# Patient Record
Sex: Male | Born: 1950 | Race: White | Hispanic: No | Marital: Married | Smoking: Former smoker
Health system: Southern US, Community
[De-identification: ages and names within clinical notes are randomized; demographics above are authoritative.]

## PROBLEM LIST (undated history)

## (undated) DIAGNOSIS — Z8719 Personal history of other diseases of the digestive system: Secondary | ICD-10-CM

## (undated) DIAGNOSIS — I639 Cerebral infarction, unspecified: Secondary | ICD-10-CM

## (undated) DIAGNOSIS — E7800 Pure hypercholesterolemia, unspecified: Secondary | ICD-10-CM

## (undated) DIAGNOSIS — I10 Essential (primary) hypertension: Secondary | ICD-10-CM

## (undated) DIAGNOSIS — F32A Depression, unspecified: Secondary | ICD-10-CM

## (undated) DIAGNOSIS — C4490 Unspecified malignant neoplasm of skin, unspecified: Secondary | ICD-10-CM

## (undated) DIAGNOSIS — A09 Infectious gastroenteritis and colitis, unspecified: Secondary | ICD-10-CM

## (undated) DIAGNOSIS — F1020 Alcohol dependence, uncomplicated: Secondary | ICD-10-CM

## (undated) DIAGNOSIS — Z860100 Personal history of colon polyps, unspecified: Secondary | ICD-10-CM

## (undated) DIAGNOSIS — M1990 Unspecified osteoarthritis, unspecified site: Secondary | ICD-10-CM

## (undated) DIAGNOSIS — K635 Polyp of colon: Secondary | ICD-10-CM

## (undated) DIAGNOSIS — K5792 Diverticulitis of intestine, part unspecified, without perforation or abscess without bleeding: Secondary | ICD-10-CM

## (undated) DIAGNOSIS — F1010 Alcohol abuse, uncomplicated: Secondary | ICD-10-CM

## (undated) HISTORY — PX: TONSILLECTOMY: SUR1361

## (undated) HISTORY — DX: Polyp of colon: K63.5

## (undated) HISTORY — DX: Essential (primary) hypertension: I10

## (undated) HISTORY — DX: Alcohol abuse, uncomplicated: F10.10

## (undated) HISTORY — PX: HERNIA REPAIR: SHX51

## (undated) HISTORY — DX: Infectious gastroenteritis and colitis, unspecified: A09

## (undated) HISTORY — DX: Alcohol dependence, uncomplicated: F10.20

## (undated) HISTORY — DX: Personal history of colon polyps, unspecified: Z86.0100

## (undated) HISTORY — DX: Depression, unspecified: F32.A

## (undated) HISTORY — DX: Diverticulitis of intestine, part unspecified, without perforation or abscess without bleeding: K57.92

## (undated) HISTORY — DX: Unspecified malignant neoplasm of skin, unspecified: C44.90

## (undated) HISTORY — PX: COLONOSCOPY: SHX174

## (undated) HISTORY — PX: OTHER SURGICAL HISTORY: SHX169

---

## 2001-03-13 ENCOUNTER — Encounter: Admission: RE | Admit: 2001-03-13 | Discharge: 2001-03-13 | Payer: Self-pay | Admitting: Internal Medicine

## 2001-03-13 ENCOUNTER — Encounter: Payer: Self-pay | Admitting: Internal Medicine

## 2001-05-03 ENCOUNTER — Ambulatory Visit (HOSPITAL_COMMUNITY): Admission: RE | Admit: 2001-05-03 | Discharge: 2001-05-03 | Payer: Self-pay | Admitting: *Deleted

## 2018-11-01 ENCOUNTER — Ambulatory Visit (INDEPENDENT_AMBULATORY_CARE_PROVIDER_SITE_OTHER): Payer: Medicare Other | Admitting: Osteopathic Medicine

## 2018-11-01 ENCOUNTER — Encounter: Payer: Self-pay | Admitting: Osteopathic Medicine

## 2018-11-01 ENCOUNTER — Ambulatory Visit (INDEPENDENT_AMBULATORY_CARE_PROVIDER_SITE_OTHER): Payer: Medicare Other

## 2018-11-01 ENCOUNTER — Other Ambulatory Visit: Payer: Self-pay

## 2018-11-01 VITALS — BP 147/84 | HR 61 | Temp 98.0°F | Ht 74.0 in | Wt 225.5 lb

## 2018-11-01 DIAGNOSIS — Z87891 Personal history of nicotine dependence: Secondary | ICD-10-CM

## 2018-11-01 DIAGNOSIS — Z8719 Personal history of other diseases of the digestive system: Secondary | ICD-10-CM

## 2018-11-01 DIAGNOSIS — Z125 Encounter for screening for malignant neoplasm of prostate: Secondary | ICD-10-CM

## 2018-11-01 DIAGNOSIS — Z0000 Encounter for general adult medical examination without abnormal findings: Secondary | ICD-10-CM

## 2018-11-01 DIAGNOSIS — R200 Anesthesia of skin: Secondary | ICD-10-CM | POA: Diagnosis not present

## 2018-11-01 DIAGNOSIS — F1021 Alcohol dependence, in remission: Secondary | ICD-10-CM

## 2018-11-01 DIAGNOSIS — R202 Paresthesia of skin: Secondary | ICD-10-CM

## 2018-11-01 DIAGNOSIS — Z789 Other specified health status: Secondary | ICD-10-CM | POA: Diagnosis not present

## 2018-11-01 DIAGNOSIS — I10 Essential (primary) hypertension: Secondary | ICD-10-CM | POA: Diagnosis not present

## 2018-11-01 DIAGNOSIS — M542 Cervicalgia: Secondary | ICD-10-CM

## 2018-11-01 DIAGNOSIS — Z86010 Personal history of colonic polyps: Secondary | ICD-10-CM

## 2018-11-01 DIAGNOSIS — R351 Nocturia: Secondary | ICD-10-CM

## 2018-11-01 MED ORDER — LOSARTAN POTASSIUM-HCTZ 50-12.5 MG PO TABS: 1.0000 | ORAL_TABLET | Freq: Every day | ORAL | 1 refills | Status: DC

## 2018-11-01 NOTE — Patient Instructions (Signed)
Plan:  Changed up BP medications, see below!  Labs (including blood and urine) first thing tomorrow Xray neck today, will plan for MRI neck See me back in a couple weeks!

## 2018-11-01 NOTE — Progress Notes (Signed)
HPI: William Wheeler is a 68 y.o. male who  has a past medical history of Alcohol abuse, Alcohol addiction ([place]), Colon polyps, Diverticulitis, and Hypertension.  he presents to [facility] today, 11/01/18,  for chief complaint of: New to establish  HTN  Arm pain History of rash Nocturia/insomnia   Very pleasant new patient here to establish care.  He is self-employed as a Cabin crew, he is married to 1 of my other patients, they also on a couple of dry-cleaning businesses together.  Patient states that is been a couple years is any kind of routine physical.  He is a recovering alcoholic, celebrated 1 year sober recently.  No major medical history other than high blood pressure and colon polyp/diverticulitis.  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  Concerned about in itching, vesicular rash on the left forearm which was present several months ago but seems to have resolved, though left a numbness/burning type feeling in the arm.  This seems to radiate up the arm and get worse with neck movement.  Has tried anti-inflammatory medications, stretching, chiropractics.  History of hypertension, currently on losartan 50 mg once daily.  History of very heavy smoker, 2 to 3 packs/day for almost 30 years, quit in 2001.  Concerned about nocturia, possibly just insomnia feeling like he just needs to urinate because he is up, does not really get up with the urge to pee.  He says he is waking up about 4-5 times per night on average.  No trouble getting to sleep but does have trouble staying asleep.    Past medical, surgical, social and family history reviewed:  Patient Active Problem List   Diagnosis Date Noted  . Essential hypertension 11/01/2018  . Alcoholism in remission ([place]) 11/01/2018  . 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 Former  very heavy cigarette smoker (more than 40 per day) 11/01/2018  . History of colon polyps 11/01/2018  . History of colonic diverticulitis 11/01/2018  . Paresthesia of left arm 11/01/2018  . Nocturia 11/01/2018  . Arm numbness left 11/01/2018    Past Surgical History:  Procedure Laterality Date  . broken bone repair     Nose    Social History   Tobacco Use  . Smoking status: Former Smoker    Packs/day: 2.50    Years: 29.00    Pack years: 72.50    Quit date: 2001    Years since quitting: 19.6  . Smokeless tobacco: Never Used  Substance Use Topics  . Alcohol use: Not Currently    History reviewed. No pertinent family history.   Current medication list and allergy/intolerance information reviewed:    Current Outpatient Medications  Medication Sig Dispense Refill  . losartan-hydrochlorothiazide (HYZAAR) 50-12.5 MG tablet Take 1 tablet by mouth daily. 30 tablet 1   No current facility-administered medications for this visit.     No Known Allergies    Review of Systems:  Constitutional:  No  fever, no chills, No recent illness, No unintentional weight changes. No significant fatigue.   HEENT: No  headache, no vision change, no hearing change, No sore throat, No  sinus pressure  Cardiac: No  chest pain, No  pressure  Respiratory:  No  shortness of breath. No  Cough  Gastrointestinal: No  abdominal pain, No  nausea, No  vomiting,  No  blood in  stool, No  diarrhea, No  constipation   Musculoskeletal: No new myalgia/arthralgia  Skin: No  Rash, No other wounds/concerning lesions  Genitourinary: No  incontinence, No  abnormal genital bleeding, No abnormal genital discharge  Hem/Onc: No  easy bruising/bleeding  Endocrine: No cold intolerance,  No heat intolerance. No polyuria/polydipsia/polyphagia   Neurologic: No  weakness, No  dizziness  Psychiatric: No  concerns with depression, No  concerns with anxiety, +sleep problems, No mood problems  Exam:  BP (!) 147/84  (BP Location: Left Arm, Patient Position: Sitting, Cuff Size: Normal)   Pulse 61   Temp 98 F (36.7 C) (Oral)   Ht 6\' 2"  (1.88 m)   Wt 225 lb 8 oz (102.3 kg)   BMI 28.95 kg/m   Constitutional: VS see above. General Appearance: alert, well-developed, well-nourished, NAD  Eyes: Normal lids and conjunctive, non-icteric sclera  Ears, Nose, Mouth, Throat: . TM normal bilaterally. Pharynx/tonsils no erythema, no exudate.  Neck: No masses, trachea midline. No thyroid enlargement. No tenderness/mass appreciated. No lymphadenopathy  Respiratory: Normal respiratory effort. no wheeze, no rhonchi, no rales  Cardiovascular: S1/S2 normal, no murmur, no rub/gallop auscultated. RRR. No lower extremity edema.   Gastrointestinal: Nontender, no masses. No hepatomegaly, no splenomegaly. No hernia appreciated. Bowel sounds normal. Rectal exam deferred.   Musculoskeletal: Gait normal. No clubbing/cyanosis of digits.  Negative Spurling's test to the L  Neurological: Normal balance/coordination. No tremor. No cranial nerve deficit on limited exam. Motor and sensation intact and symmetric. Cerebellar reflexes intact.   Skin: warm, dry, intact. No rash/ulcer. No concerning nevi or subq nodules on limited exam.    Psychiatric: Normal judgment/insight. Normal mood and affect. Oriented x3.    No results found for this or any previous visit (from the past 72 hour(s)).  No results found.   ASSESSMENT/PLAN: The primary encounter diagnosis was Essential hypertension. Diagnoses of Alcoholism in remission [facility]), 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, Former very heavy cigarette smoker (more than 40 per day), Prostate cancer screening, History of colon polyps, History of colonic diverticulitis, Paresthesia of left arm, Arm numbness left, Nocturia, Annual physical exam, and Cervicalgia were also pertinent to this visit.   Rash/paresthesia sounds like possibly residual neuropathic pain from shingles outbreak.  Possibly  musculoskeletal though, patient has failed greater than 6 weeks of conservative treatment and would like to proceed with MRI if possible.  Orders Placed This Encounter  Procedures  . DG Cervical Spine Complete  . MR Cervical Spine Wo Contrast  . CBC  . COMPLETE METABOLIC PANEL WITH GFR  . Lipid panel  . PSA, Total with Reflex to PSA, Free  . TSH  . Urinalysis, Routine w reflex microscopic    Meds ordered this encounter  Medications  . losartan-hydrochlorothiazide (HYZAAR) 50-12.5 MG tablet    Sig: Take 1 tablet by mouth daily.    Dispense:  30 tablet    Refill:  1    Patient Instructions  Plan:  Changed up BP medications, see below!  Labs (including blood and urine) first thing tomorrow Xray neck today, will plan for MRI neck See me back in a couple weeks!          Visit summary with medication list and pertinent instructions was printed for patient to review. All questions at time of visit were answered - patient instructed to contact office with any additional concerns or updates. ER/RTC precautions were reviewed with the patient.     Please note: voice recognition software was used to produce  this document, and typos may escape review. Please contact Dr. Sheppard Coil for any needed clarifications.     Follow-up plan: Return in about 2 weeks (around 11/15/2018) for follow up blood pressure and review labs/imaging results w/ Dr Sheppard Coil .

## 2018-11-05 ENCOUNTER — Ambulatory Visit (INDEPENDENT_AMBULATORY_CARE_PROVIDER_SITE_OTHER): Payer: Medicare Other

## 2018-11-05 ENCOUNTER — Other Ambulatory Visit: Payer: Self-pay | Admitting: Osteopathic Medicine

## 2018-11-05 ENCOUNTER — Other Ambulatory Visit: Payer: Self-pay

## 2018-11-05 DIAGNOSIS — Z1389 Encounter for screening for other disorder: Secondary | ICD-10-CM | POA: Diagnosis not present

## 2018-11-05 DIAGNOSIS — M542 Cervicalgia: Secondary | ICD-10-CM

## 2018-11-09 LAB — URINALYSIS, ROUTINE W REFLEX MICROSCOPIC
Bilirubin Urine: NEGATIVE
Glucose, UA: NEGATIVE
Hgb urine dipstick: NEGATIVE
Ketones, ur: NEGATIVE
Leukocytes,Ua: NEGATIVE
Nitrite: NEGATIVE
Protein, ur: NEGATIVE
Specific Gravity, Urine: 1.018 (ref 1.001–1.03)
pH: <=5 (ref 5.0–8.0)

## 2018-11-09 LAB — CBC
HCT: 47.1 % (ref 38.5–50.0)
Hemoglobin: 15.6 g/dL (ref 13.2–17.1)
MCH: 28.7 pg (ref 27.0–33.0)
MCHC: 33.1 g/dL (ref 32.0–36.0)
MCV: 86.6 fL (ref 80.0–100.0)
MPV: 11.3 fL (ref 7.5–12.5)
Platelets: 232 10*3/uL (ref 140–400)
RBC: 5.44 10*6/uL (ref 4.20–5.80)
RDW: 14.2 % (ref 11.0–15.0)
WBC: 6.5 10*3/uL (ref 3.8–10.8)

## 2018-11-09 LAB — COMPLETE METABOLIC PANEL WITH GFR
AG Ratio: 1.8 (calc) (ref 1.0–2.5)
ALT: 11 U/L (ref 9–46)
AST: 12 U/L (ref 10–35)
Albumin: 4.2 g/dL (ref 3.6–5.1)
Alkaline phosphatase (APISO): 35 U/L (ref 35–144)
BUN: 18 mg/dL (ref 7–25)
CO2: 25 mmol/L (ref 20–32)
Calcium: 9.7 mg/dL (ref 8.6–10.3)
Chloride: 107 mmol/L (ref 98–110)
Creat: 1.11 mg/dL (ref 0.70–1.25)
GFR, Est African American: 80 mL/min/{1.73_m2} (ref >=60)
GFR, Est Non African American: 69 mL/min/{1.73_m2} (ref >=60)
Globulin: 2.4 g/dL (calc) (ref 1.9–3.7)
Glucose, Bld: 95 mg/dL (ref 65–99)
Potassium: 4.7 mmol/L (ref 3.5–5.3)
Sodium: 140 mmol/L (ref 135–146)
Total Bilirubin: 0.6 mg/dL (ref 0.2–1.2)
Total Protein: 6.6 g/dL (ref 6.1–8.1)

## 2018-11-09 LAB — LIPID PANEL
Cholesterol: 206 mg/dL — ABNORMAL HIGH (ref <200)
HDL: 45 mg/dL (ref >=40)
LDL Cholesterol (Calc): 139 mg/dL (calc) — ABNORMAL HIGH
Non-HDL Cholesterol (Calc): 161 mg/dL (calc) — ABNORMAL HIGH (ref <130)
Total CHOL/HDL Ratio: 4.6 (calc) (ref <5.0)
Triglycerides: 107 mg/dL (ref <150)

## 2018-11-09 LAB — PSA, TOTAL WITH REFLEX TO PSA, FREE: PSA, Total: 0.8 ng/mL (ref <=4.0)

## 2018-11-09 LAB — TSH: TSH: 1.49 mIU/L (ref 0.40–4.50)

## 2018-11-15 ENCOUNTER — Ambulatory Visit (INDEPENDENT_AMBULATORY_CARE_PROVIDER_SITE_OTHER): Payer: Medicare Other | Admitting: Osteopathic Medicine

## 2018-11-15 ENCOUNTER — Encounter: Payer: Self-pay | Admitting: Osteopathic Medicine

## 2018-11-15 ENCOUNTER — Other Ambulatory Visit: Payer: Self-pay

## 2018-11-15 VITALS — BP 155/95 | HR 56 | Temp 97.8°F | Wt 226.7 lb

## 2018-11-15 DIAGNOSIS — B029 Zoster without complications: Secondary | ICD-10-CM

## 2018-11-15 DIAGNOSIS — Z23 Encounter for immunization: Secondary | ICD-10-CM | POA: Diagnosis not present

## 2018-11-15 DIAGNOSIS — M4302 Spondylolysis, cervical region: Secondary | ICD-10-CM | POA: Diagnosis not present

## 2018-11-15 DIAGNOSIS — M4802 Spinal stenosis, cervical region: Secondary | ICD-10-CM | POA: Diagnosis not present

## 2018-11-15 MED ORDER — VALACYCLOVIR HCL 1 G PO TABS: 1000.0000 mg | ORAL_TABLET | Freq: Three times a day (TID) | ORAL | 0 refills | Status: DC

## 2018-11-15 MED ORDER — GABAPENTIN 300 MG PO CAPS: 300.0000 mg | ORAL_CAPSULE | Freq: Three times a day (TID) | ORAL | 0 refills | Status: DC | PRN

## 2018-11-15 NOTE — Progress Notes (Signed)
HPI: William Wheeler is a 68 y.o. male who  has a past medical history of Alcohol abuse, Alcohol addiction ([place]), Colon polyps, Diverticulitis, and Hypertension.  he presents to [facility] today, 11/15/18,  for chief complaint of:  Follow-up arm/neck pain Concern for shingles  History of what sounds like shingles on left upper arm with some residual nerve pain.  See previous note 11/01/2018 for details.  Today, he complains of similar burning skin sensation on the left thigh/hip and left lower back, no rash, extremely sensitive to the touch as far as clothes rubbing on it will bother him.  Feels pretty similar to the arm pain that he had before.  We went over x-ray/MRI results.  Fairly significant arthritic changes in C-spine, see MRI from 11/05/2018.  Patient at this point does not wish to pursue specialist referral or formal physical therapy    At today's visit 11/15/18 ... PMH, PSH, FH reviewed and updated as needed.  Current medication list and allergy/intolerance hx reviewed and updated as needed. (See remainder of HPI, ROS, Phys Exam below)   No results found.  No results found for this or any previous visit (from the past 72 hour(s)).    BP Readings from Last 3 Encounters:  11/15/18 (!) 155/95  11/01/18 (!) 147/84       ASSESSMENT/PLAN: The primary encounter diagnosis was Need for influenza vaccination. Diagnoses of Cervical spondylolysis, Cervical stenosis of spinal canal, Foraminal stenosis of cervical region, and Herpes zoster without complication were also pertinent to this visit.   Orders Placed This Encounter  Procedures  . Flu Vaccine QUAD High Dose(Fluad)     Meds ordered this encounter  Medications  . valACYclovir (VALTREX) 1000 MG tablet    Sig: Take 1 tablet (1,000 mg total) by mouth 3 (three) times daily.    Dispense:  21 tablet    Refill:  0  . gabapentin (NEURONTIN) 300 MG capsule    Sig: Take 1-2 capsules  (300-600 mg total) by mouth 3 (three) times daily as needed.    Dispense:  180 capsule    Refill:  0       Follow-up plan: Return in about 2 weeks (around 11/29/2018) for nurse visit BP check.                                                 ################################################# ################################################# ################################################# #################################################    Current Meds  Medication Sig  . losartan-hydrochlorothiazide (HYZAAR) 50-12.5 MG tablet Take 1 tablet by mouth daily.    No Known Allergies     Review of Systems:  Constitutional: No recent illness  Cardiac: No  chest pain, No  pressure, No palpitations  Respiratory:  No  shortness of breath.   Gastrointestinal: No  abdominal pain  Musculoskeletal: No new myalgia/arthralgia  Skin: No  Rash, +skin pain per HPI  Neurologic: No  weakness, No  Dizziness  Exam:  BP (!) 155/95 (BP Location: Left Arm, Patient Position: Sitting, Cuff Size: Normal)   Pulse (!) 56   Temp 97.8 F (36.6 C) (Oral)   Wt 226 lb 11.2 oz (102.8 kg)   BMI 29.11 kg/m   Constitutional: VS see above. General Appearance: alert, well-developed, well-nourished, NAD  Eyes: Normal lids and conjunctive, non-icteric sclera  Neck: No masses, trachea midline.   Respiratory: Normal respiratory effort.  Musculoskeletal: Gait normal. Symmetric and independent movement of all extremities  Neurological: Normal balance/coordination. No tremor.  Skin: warm, dry, intact.   Psychiatric: Normal judgment/insight. Normal mood and affect. Oriented x3.       Visit summary with medication list and pertinent instructions was printed for patient to review, patient was advised to alert [place] if any updates are needed. All questions at time of visit were answered - patient instructed to contact office with any additional concerns.  ER/RTC precautions were reviewed with the patient and understanding verbalized.   Note: Total time spent 25 minutes, greater than 50% of the visit was spent face-to-face counseling and coordinating care for the following: The primary encounter diagnosis was Need for influenza vaccination. Diagnoses of Cervical spondylolysis, Cervical stenosis of spinal canal, Foraminal stenosis of cervical region, and Herpes zoster without complication were also pertinent to this visit.Marland Kitchen  Please note: voice recognition software was used to produce this document, and typos may escape review. Please contact Dr. Sheppard Coil for any needed clarifications.    Follow up plan: Return in about 2 weeks (around 11/29/2018) for nurse visit BP check.

## 2018-11-16 DIAGNOSIS — M4302 Spondylolysis, cervical region: Secondary | ICD-10-CM | POA: Insufficient documentation

## 2018-11-16 DIAGNOSIS — B029 Zoster without complications: Secondary | ICD-10-CM | POA: Insufficient documentation

## 2018-11-16 DIAGNOSIS — M4802 Spinal stenosis, cervical region: Secondary | ICD-10-CM | POA: Insufficient documentation

## 2018-11-20 ENCOUNTER — Telehealth: Payer: Self-pay

## 2018-11-20 MED ORDER — HYDROCODONE-ACETAMINOPHEN 5-325 MG PO TABS: 1.0000 | ORAL_TABLET | Freq: Three times a day (TID) | ORAL | 0 refills | Status: DC | PRN

## 2018-11-20 NOTE — Telephone Encounter (Signed)
OK sent short course to pharmacy, please let him know this I sent an opiate pain medication and refills will not be made indefinitely, he should set up virtual visit to discus pai management if needed!

## 2018-11-20 NOTE — Telephone Encounter (Signed)
Pt has been updated and is aware of provider's note. Pt will set up a virtual appt prn. No other inquiries during call.

## 2018-11-20 NOTE — Telephone Encounter (Signed)
Pt left a vm msg stating he now has a rash and excrutiating pain on the left side of his torso/ lower extremities. As per pt, he was given valacyclovir rx for Shingles outbreak at his last appt on 11/15/18 . Pt is now requesting a pain med because he is unable to move around and do normal activities. Pls advise, thanks.

## 2018-11-22 ENCOUNTER — Telehealth: Payer: Self-pay

## 2018-11-22 ENCOUNTER — Other Ambulatory Visit: Payer: Self-pay | Admitting: Osteopathic Medicine

## 2018-11-22 NOTE — Telephone Encounter (Signed)
Pt called - still experiencing intolerable pain from Shingles outbreak. Pt states current pain med not working. He has been missing work because of the pain. Pt is requesting something "better" to be sent to the pharmacy. Pls advise, thanks.

## 2018-11-22 NOTE — Telephone Encounter (Signed)
At this point, I've sent 5 days of pain medications, cannot legally send more without visit per my understanding up [place] STOP Act which regulated opiate prescription. Would reocmmend virtual visit with another provider to discuss and determine appropriateness of higher does / longer duration

## 2018-11-22 NOTE — Telephone Encounter (Signed)
Pt called requesting a med refill for valacyclovir. Pt states that gabapentin and hydrocodone is not relieving pain from Shingles outbreak. As per Dr. Georgina Snell, no refill at this time. Pt will need to come in for re-evaluation of symptoms. Contacted pt, agrees with covering provider's recommendation. He was also made aware during time of call of Dr. Redgie Grayer update of changing current pain medication. Pt was transferred to front desk for scheduling an appt.

## 2018-11-23 ENCOUNTER — Ambulatory Visit (INDEPENDENT_AMBULATORY_CARE_PROVIDER_SITE_OTHER): Payer: Medicare Other | Admitting: Sports Medicine

## 2018-11-23 ENCOUNTER — Encounter: Payer: Self-pay | Admitting: Sports Medicine

## 2018-11-23 ENCOUNTER — Other Ambulatory Visit: Payer: Self-pay

## 2018-11-23 DIAGNOSIS — B029 Zoster without complications: Secondary | ICD-10-CM

## 2018-11-23 MED ORDER — PREDNISONE 50 MG PO TABS: ORAL_TABLET | ORAL | 0 refills | Status: DC

## 2018-11-23 MED ORDER — HYDROCODONE-ACETAMINOPHEN 10-325 MG PO TABS: 1.0000 | ORAL_TABLET | Freq: Three times a day (TID) | ORAL | 0 refills | Status: DC | PRN

## 2018-11-23 MED ORDER — GABAPENTIN 800 MG PO TABS: 800.0000 mg | ORAL_TABLET | Freq: Three times a day (TID) | ORAL | 3 refills | Status: DC

## 2018-11-23 NOTE — Assessment & Plan Note (Signed)
Shingles in the left L4 and L5 distribution. He finished his course of Valtrex 1000 3 times daily for 7 days. Persistent and severe allodynia on the skin of his abdomen. Adding 5 days of prednisone, increasing gabapentin to 800 mg 3 times daily. Increasing hydrocodone. Virtual visit with me in a week.

## 2018-11-23 NOTE — Progress Notes (Signed)
Subjective:    CC: Shingles  HPI: William Wheeler returns, he was diagnosed with left lumbar shingles at his last visit, started appropriately on gabapentin and high-dose Valtrex.  Unfortunately he continues to have significant allodynia over his abdomen, legs, shoulders, no other symptoms.  His rash is still present but starting to darken, there are no open sores or scabs.  I reviewed the past medical history, family history, social history, surgical history, and allergies today and no changes were needed.  Please see the problem list section below in epic for further details.  Past Medical History: Past Medical History:  Diagnosis Date  . Alcohol abuse   . 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 Colon polyps   . Diverticulitis   . Hypertension    Past Surgical History: Past Surgical History:  Procedure Laterality Date  . broken bone repair     Nose   Social History: Social History   Socioeconomic History  . Marital status: Married    Spouse name: Not on file  . Number of children: Not on file  . Years of education: Not on file  . Highest education level: Not on file  Occupational History  . Not on file  Social Needs  . Financial resource strain: Not on file  . Food insecurity    Worry: Not on file    Inability: Not on file  . Transportation needs    Medical: Not on file    Non-medical: Not on file  Tobacco Use  . Smoking status: Former Smoker    Packs/day: 2.50    Years: 29.00    Pack years: 72.50    Quit date: 2001    Years since quitting: 19.7  . Smokeless tobacco: Never Used  Substance and Sexual Activity  . Alcohol use: Not Currently  . Drug use: Not Currently    Types: Marijuana  . Sexual activity: Not Currently    Partners: Female  Lifestyle  . Physical activity    Days per week: Not on file    Minutes per session: Not on file  . Stress: Not on file  Relationships  . Social Herbalist on phone: Not on file    Gets together: Not on file    Attends  religious service: Not on file    Active member of club or organization: Not on file    Attends meetings of clubs or organizations: Not on file    Relationship status: Not on file  Other Topics Concern  . Not on file  Social History Narrative  . Not on file   Family History: No family history on file. Allergies: No Known Allergies Medications: See med rec.  Review of Systems: No fevers, chills, night sweats, weight loss, chest pain, or shortness of breath.   Objective:    General: Well Developed, well nourished, and in no acute distress.  Neuro: Alert and oriented x3, extra-ocular muscles intact, sensation grossly intact.  HEENT: Normocephalic, atraumatic, pupils equal round reactive to light, neck supple, no masses, no lymphadenopathy, thyroid nonpalpable.  Skin: Warm and dry, left L3 versus L4 distribution herpes zoster.  No open sores, no scabs, starting to fade. Cardiac: Regular rate and rhythm, no murmurs rubs or gallops, no lower extremity edema.  Respiratory: Clear to auscultation bilaterally. Not using accessory muscles, speaking in full sentences.  Impression and Recommendations:    Herpes zoster without complication Shingles in the left L4 and L5 distribution. He finished his course of Valtrex 1000 3 times  daily for 7 days. Persistent and severe allodynia on the skin of his abdomen. Adding 5 days of prednisone, increasing gabapentin to 800 mg 3 times daily. Increasing hydrocodone. Virtual visit with me in a week.   ___________________________________________ Gwen Her. Dianah Field, M.D., ABFM., CAQSM. Primary Care and Sports Medicine [facility]  Adjunct Professor of [place] of [facility] [employer]

## 2018-11-23 NOTE — Telephone Encounter (Signed)
Pt was updated yesterday regarding provider's note. As per covering provider, pt will need to be re-evaluated. Pt has agreed with recommendation and was transferred to front desk for scheduling. No other inquiries during call.

## 2018-11-29 ENCOUNTER — Ambulatory Visit (INDEPENDENT_AMBULATORY_CARE_PROVIDER_SITE_OTHER): Payer: Medicare Other | Admitting: Sports Medicine

## 2018-11-29 ENCOUNTER — Other Ambulatory Visit: Payer: Self-pay

## 2018-11-29 ENCOUNTER — Ambulatory Visit: Payer: Medicare Other

## 2018-11-29 DIAGNOSIS — B029 Zoster without complications: Secondary | ICD-10-CM | POA: Diagnosis not present

## 2018-11-29 MED ORDER — DOCUSATE SODIUM 100 MG PO CAPS: 100.0000 mg | ORAL_CAPSULE | Freq: Three times a day (TID) | ORAL | 3 refills | Status: DC | PRN

## 2018-11-29 MED ORDER — OXYCODONE-ACETAMINOPHEN 10-325 MG PO TABS: 1.0000 | ORAL_TABLET | Freq: Four times a day (QID) | ORAL | 0 refills | Status: DC | PRN

## 2018-11-29 NOTE — Assessment & Plan Note (Addendum)
Left L4 and L5 distribution shingles. He finished Valtrex thousand 3 times daily. Continues to have severe allodynia of the skin of his abdomen. He feels better putting pressure on this so we added a rib belt. I am going to increase gabapentin again to 800 mg in the morning, 800 midday and 1600 in the evening. Increasing pain medication from hydrocodone to oxycodone tens. Adding some Colace. We do need some labs, return to see [place] in a couple of weeks. We will unlikely be doing any more narcotics.

## 2018-11-29 NOTE — Progress Notes (Signed)
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  We started gabapentin 800 mg 3 times daily, also hydrocodone 10 mg.  He returns today with persistence of discomfort in his belly.  He is also getting a little bit constipated.  I reviewed the past medical history, family history, social history, surgical history, and allergies today and no changes were needed.  Please see the problem list section below in epic for further details.  Past Medical History: Past Medical History:  Diagnosis Date  . Alcohol abuse   . Alcohol addiction ([place])   . Colon polyps   . Diverticulitis   . Hypertension    Past Surgical History: Past Surgical History:  Procedure Laterality Date  . broken bone repair     Nose   Social History: Social History   Socioeconomic History  . Marital status: Married    Spouse name: Not on file  . Number of children: Not on file  . Years of education: Not on file  . Highest education level: Not on file  Occupational History  . Not on file  Social Needs  . Financial resource strain: Not on file  . Food insecurity    Worry: Not on file    Inability: Not on file  . Transportation needs    Medical: Not on file    Non-medical: Not on file  Tobacco Use  . Smoking status: Former Smoker    Packs/day: 2.50    Years: 29.00    Pack years: 72.50    Quit date: 2001    Years since quitting: 19.7  . Smokeless tobacco: Never Used  Substance and Sexual Activity  . Alcohol use: Not Currently  . Drug use: Not Currently    Types: Marijuana  . Sexual activity: Not Currently    Partners: Female  Lifestyle  . Physical activity    Days per week: Not on file    Minutes per session: Not on file  . Stress: Not on file  Relationships  . Social Herbalist on phone: Not on file    Gets together: Not on file    Attends religious  service: Not on file    Active member of club or organization: Not on file    Attends meetings of clubs or organizations: Not on file    Relationship status: Not on file  Other Topics Concern  . Not on file  Social History Narrative  . Not on file   Family History: No family history on file. Allergies: No Known Allergies Medications: See med rec.  Review of Systems: No fevers, chills, night sweats, weight loss, chest pain, or shortness of breath.   Objective:    General: Well Developed, well nourished, and in no acute distress.  Neuro: Alert and oriented x3, extra-ocular muscles intact, sensation grossly intact.  HEENT: Normocephalic, atraumatic, pupils equal round reactive to light, neck supple, no masses, no lymphadenopathy, thyroid nonpalpable.  Skin: Warm and dry, no rashes. Cardiac: Regular rate and rhythm, no murmurs rubs or gallops, no lower extremity edema.  Respiratory: Clear to auscultation bilaterally. Not using accessory muscles, speaking in full sentences. Abdomen: No tenderness to deep palpation, no organomegaly, he does however have significant pain to light palpation confirming allodynia.  Impression and Recommendations:    Herpes zoster without complication Left L4 and L5 distribution shingles. He finished Valtrex thousand 3  times daily. Continues to have severe allodynia of the skin of his abdomen. He feels better putting pressure on this so we added a rib belt. I am going to increase gabapentin again to 800 mg in the morning, 800 midday and 1600 in the evening. Increasing pain medication from hydrocodone to oxycodone tens. Adding some Colace. We do need some labs, return to see [place] in a couple of weeks. We will unlikely be doing any more narcotics.   ___________________________________________ Gwen Her. Dianah Field, M.D., ABFM., CAQSM. Primary Care and Sports Medicine [facility]  Adjunct Professor of [place]  of [facility] [employer]

## 2018-11-30 ENCOUNTER — Ambulatory Visit: Payer: Medicare Other | Admitting: Sports Medicine

## 2018-12-06 ENCOUNTER — Telehealth: Payer: Self-pay | Admitting: *Deleted

## 2018-12-06 ENCOUNTER — Other Ambulatory Visit: Payer: Self-pay | Admitting: *Deleted

## 2018-12-06 DIAGNOSIS — B029 Zoster without complications: Secondary | ICD-10-CM

## 2018-12-06 NOTE — Telephone Encounter (Signed)
Pt left vm today stating that the shingles pain is started to subside some but he still does have some pain.  He wanted to know if you would refill the pain medication until his f/u appointment with you next Thursday 10/8.  Please advise.

## 2018-12-07 MED ORDER — OXYCODONE-ACETAMINOPHEN 10-325 MG PO TABS: 1.0000 | ORAL_TABLET | Freq: Four times a day (QID) | ORAL | 0 refills | Status: DC | PRN

## 2018-12-07 NOTE — Telephone Encounter (Signed)
Patient notified and did not have any questions.  

## 2018-12-07 NOTE — Telephone Encounter (Signed)
Meds refilled.

## 2018-12-13 ENCOUNTER — Other Ambulatory Visit: Payer: Self-pay

## 2018-12-13 ENCOUNTER — Ambulatory Visit (INDEPENDENT_AMBULATORY_CARE_PROVIDER_SITE_OTHER): Payer: Medicare Other | Admitting: Sports Medicine

## 2018-12-13 DIAGNOSIS — B029 Zoster without complications: Secondary | ICD-10-CM | POA: Diagnosis not present

## 2018-12-13 MED ORDER — OXYCODONE-ACETAMINOPHEN 10-325 MG PO TABS: 1.0000 | ORAL_TABLET | Freq: Three times a day (TID) | ORAL | 0 refills | Status: DC | PRN

## 2018-12-13 NOTE — Progress Notes (Signed)
Subjective:    CC: Follow-up  HPI: William Wheeler returns, he had a left L4 and L5 distribution shingles with skin allodynia, we treated him aggressively, finally his pain is coming under control.  Rash is resolved, no constitutional symptoms.  I reviewed the past medical history, family history, social history, surgical history, and allergies today and no changes were needed.  Please see the problem list section below in epic for further details.  Past Medical History: Past Medical History:  Diagnosis Date  . Alcohol abuse   . Alcohol addiction ([place])   . Colon polyps   . Diverticulitis   . Hypertension    Past Surgical History: Past Surgical History:  Procedure Laterality Date  . broken bone repair     Nose   Social History: Social History   Socioeconomic History  . Marital status: Married    Spouse name: Not on file  . Number of children: Not on file  . Years of education: Not on file  . Highest education level: Not on file  Occupational History  . Not on file  Social Needs  . Financial resource strain: Not on file  . Food insecurity    Worry: Not on file    Inability: Not on file  . Transportation needs    Medical: Not on file    Non-medical: Not on file  Tobacco Use  . Smoking status: Former Smoker    Packs/day: 2.50    Years: 29.00    Pack years: 72.50    Quit date: 2001    Years since quitting: 19.7  . Smokeless tobacco: Never Used  Substance and Sexual Activity  . Alcohol use: Not Currently  . Drug use: Not Currently    Types: Marijuana  . Sexual activity: Not Currently    Partners: Female  Lifestyle  . Physical activity    Days per week: Not on file    Minutes per session: Not on file  . Stress: Not on file  Relationships  . Social Herbalist on phone: Not on file    Gets together: Not on file    Attends religious service: Not on file    Active member of club or organization: Not on file    Attends meetings of clubs or organizations:  Not on file    Relationship status: Not on file  Other Topics Concern  . Not on file  Social History Narrative  . Not on file   Family History: No family history on file. Allergies: No Known Allergies Medications: See med rec.  Review of Systems: No fevers, chills, night sweats, weight loss, chest pain, or shortness of breath.   Objective:    General: Well Developed, well nourished, and in no acute distress.  Neuro: Alert and oriented x3, extra-ocular muscles intact, sensation grossly intact.  HEENT: Normocephalic, atraumatic, pupils equal round reactive to light, neck supple, no masses, no lymphadenopathy, thyroid nonpalpable.  Skin: Warm and dry, no rashes. Cardiac: Regular rate and rhythm, no murmurs rubs or gallops, no lower extremity edema.  Respiratory: Clear to auscultation bilaterally. Not using accessory muscles, speaking in full sentences.  Impression and Recommendations:    Herpes zoster without complication Left L4 and L5 distribution shingles. Finished Valtrex 1000 mg 3 times daily. He did have some severe skin allodynia, this is starting to improve considerably with gabapentin 800 mg in the morning, 800 mg midday, and 1600 mg in the evening. Single additional refill of 1 week of oxycodone, this will  be 21 pills. He and I both discussed that he would not be getting any more oxycodone, return to see me as needed.   ___________________________________________ Gwen Her. Dianah Field, M.D., ABFM., CAQSM. Primary Care and Sports Medicine [facility]  Adjunct Professor of [employer]

## 2018-12-13 NOTE — Assessment & Plan Note (Signed)
Left L4 and L5 distribution shingles. Finished Valtrex 1000 mg 3 times daily. He did have some severe skin allodynia, this is starting to improve considerably with gabapentin 800 mg in the morning, 800 mg midday, and 1600 mg in the evening. Single additional refill of 1 week of oxycodone, this will be 21 pills. He and I both discussed that he would not be getting any more oxycodone, return to see me as needed.

## 2019-01-15 ENCOUNTER — Other Ambulatory Visit: Payer: Self-pay | Admitting: Osteopathic Medicine

## 2019-02-18 ENCOUNTER — Encounter: Payer: Self-pay | Admitting: Osteopathic Medicine

## 2019-02-18 ENCOUNTER — Telehealth (INDEPENDENT_AMBULATORY_CARE_PROVIDER_SITE_OTHER): Payer: Medicare Other | Admitting: Osteopathic Medicine

## 2019-02-18 ENCOUNTER — Telehealth: Payer: Medicare Other

## 2019-02-18 VITALS — Temp 97.0°F | Wt 225.0 lb

## 2019-02-18 DIAGNOSIS — B0229 Other postherpetic nervous system involvement: Secondary | ICD-10-CM

## 2019-02-18 DIAGNOSIS — K5792 Diverticulitis of intestine, part unspecified, without perforation or abscess without bleeding: Secondary | ICD-10-CM | POA: Diagnosis not present

## 2019-02-18 MED ORDER — AMOXICILLIN-POT CLAVULANATE 875-125 MG PO TABS: 1.0000 | ORAL_TABLET | Freq: Three times a day (TID) | ORAL | 0 refills | Status: DC

## 2019-02-18 NOTE — Progress Notes (Signed)
Virtual Visit via Video (App used: Doximity) Note  I connected with      William Wheeler on 02/18/19 at 3:40 PM  by a telemedicine application and verified that I am speaking with the correct person using two identifiers.  Patient is at home I am in office   I discussed the limitations of evaluation and management by telemedicine and the availability of in person appointments. The patient expressed understanding and agreed to proceed.  History of Present Illness: William Wheeler is a 68 y.o. male who would like to discuss diverticulitis / LLQ pain  Has been fine for a long time but had a handful of peanuts yesterday and now LLQ pain and loose stool, no bloody stool, no fever, no N/V. Hx diverticulitis, this feels about the same, amoxicillin has worked in the past.    Reports persistent nerve pain d/t shingles, ok on Gabapentin but often forgets the afternoon dose.      Observations/Objective: Temp (!) 97 F (36.1 C) (Oral)   Wt 225 lb (102.1 kg)   BMI 28.89 kg/m  BP Readings from Last 3 Encounters:  12/13/18 133/76  11/29/18 (!) 156/95  11/23/18 (!) 145/85   Exam: Normal Speech.  NAD  Lab and Radiology Results No results found for this or any previous visit (from the past 72 hour(s)). No results found.     Assessment and Plan: 68 y.o. male with The primary encounter diagnosis was Diverticulitis. A diagnosis of Post herpetic neuralgia was also pertinent to this visit.  OK to send abx for low risk patient, would Rx cipro/flagyl if worse  OK to switch to Lyrica if desired pt would rather hold off for now and continue gabapentin  Inquires about shingles vaccine    PDMP not reviewed this encounter. No orders of the defined types were placed in this encounter.  Meds ordered this encounter  Medications  . amoxicillin-clavulanate (AUGMENTIN) 875-125 MG tablet    Sig: Take 1 tablet by mouth 3 (three) times daily for 7 days.    Dispense:  21 tablet    Refill:  0    Patient Instructions  Regarding shingles vaccination in patient with history of shingles: "Guidelines from the CDC do not indicate a specific length of time a patient should wait to get vaccinated after an episode of herpes zoster but do state that [vaccination] should be delayed until the acute stage of the illness is over and symptoms resolve (eg, the rash has gone away)."  So you'd be ok to get the shot! If you'd like to schedule a nurse visit for the vaccine please call our office.   Are the antibiotics kicking in yet?  Let me know if there are any issues!   Follow Up Instructions: Return if symptoms worsen or fail to improve.    I discussed the assessment and treatment plan with the patient. The patient was provided an opportunity to ask questions and all were answered. The patient agreed with the plan and demonstrated an understanding of the instructions.   The patient was advised to call back or seek an in-person evaluation if any new concerns, if symptoms worsen or if the condition fails to improve as anticipated.  25 minutes of non-face-to-face time was provided during this encounter.      . . . . . . . . . . . . . Marland Kitchen                   Historical information  moved to improve visibility of documentation.  Past Medical History:  Diagnosis Date  . Alcohol abuse   . 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 Colon polyps   . Diverticulitis   . Hypertension    Past Surgical History:  Procedure Laterality Date  . broken bone repair     Nose   Social History   Tobacco Use  . Smoking status: Former Smoker    Packs/day: 2.50    Years: 29.00    Pack years: 72.50    Quit date: 2001    Years since quitting: 19.9  . Smokeless tobacco: Never Used  Substance Use Topics  . Alcohol use: Not Currently   family history is not on file.  Medications: Current Outpatient Medications  Medication Sig Dispense Refill  . gabapentin (NEURONTIN) 800 MG tablet 1  tab in the morning, 1 tab midday, 2 tabs at bedtime    . losartan-hydrochlorothiazide (HYZAAR) 50-12.5 MG tablet TAKE 1 TABLET BY MOUTH EVERY DAY 30 tablet 1  . valACYclovir (VALTREX) 1000 MG tablet TAKE 1 TABLET BY MOUTH THREE TIMES A DAY 21 tablet 0  . docusate sodium (COLACE) 100 MG capsule Take 1 capsule (100 mg total) by mouth 3 (three) times daily as needed. (Patient not taking: Reported on 02/18/2019) 30 capsule 3  . oxyCODONE-acetaminophen (PERCOCET) 10-325 MG tablet Take 1 tablet by mouth every 8 (eight) hours as needed for pain. (Patient not taking: Reported on 02/18/2019) 21 tablet 0   No current facility-administered medications for this visit.   No Known Allergies

## 2019-02-19 ENCOUNTER — Encounter: Payer: Self-pay | Admitting: Osteopathic Medicine

## 2019-02-19 NOTE — Patient Instructions (Signed)
Regarding shingles vaccination in patient with history of shingles: "Guidelines from the CDC do not indicate a specific length of time a patient should wait to get vaccinated after an episode of herpes zoster but do state that [vaccination] should be delayed until the acute stage of the illness is over and symptoms resolve (eg, the rash has gone away)."  So you'd be ok to get the shot! If you'd like to schedule a nurse visit for the vaccine please call our office.

## 2019-03-14 ENCOUNTER — Other Ambulatory Visit: Payer: Self-pay | Admitting: Osteopathic Medicine

## 2019-04-25 ENCOUNTER — Other Ambulatory Visit: Payer: Self-pay | Admitting: Osteopathic Medicine

## 2019-04-27 ENCOUNTER — Ambulatory Visit: Payer: Medicare Other | Attending: Internal Medicine

## 2019-04-27 DIAGNOSIS — Z23 Encounter for immunization: Secondary | ICD-10-CM | POA: Insufficient documentation

## 2019-04-27 NOTE — Progress Notes (Signed)
   Covid-19 Vaccination Clinic  Name:  William Wheeler    MRN: [id number] DOB: 22-Jan-1951  04/27/2019  [name] was observed post Covid-19 immunization for 15 minutes without incidence. He was provided with Vaccine Information Sheet and instruction to access the V-Safe system.   Mr. Marks was instructed to call 911 with any severe reactions post vaccine: Marland Kitchen Difficulty breathing  . Swelling of your face and throat  . A fast heartbeat  . A bad rash all over your body  . Dizziness and weakness    Immunizations Administered    Name Date Dose VIS Date Route   Pfizer COVID-19 Vaccine 04/27/2019 11:44 AM 0.3 mL 02/15/2019 Intramuscular   Manufacturer: [place]   Lot: [id number]   [place]: [id number]

## 2019-05-16 ENCOUNTER — Other Ambulatory Visit: Payer: Self-pay

## 2019-05-16 ENCOUNTER — Ambulatory Visit (INDEPENDENT_AMBULATORY_CARE_PROVIDER_SITE_OTHER): Payer: Medicare Other | Admitting: Osteopathic Medicine

## 2019-05-16 ENCOUNTER — Encounter: Payer: Self-pay | Admitting: Osteopathic Medicine

## 2019-05-16 VITALS — BP 134/87 | HR 68 | Temp 98.1°F | Wt 226.0 lb

## 2019-05-16 DIAGNOSIS — R413 Other amnesia: Secondary | ICD-10-CM | POA: Diagnosis not present

## 2019-05-16 DIAGNOSIS — R252 Cramp and spasm: Secondary | ICD-10-CM

## 2019-05-16 DIAGNOSIS — R29818 Other symptoms and signs involving the nervous system: Secondary | ICD-10-CM | POA: Diagnosis not present

## 2019-05-16 DIAGNOSIS — H539 Unspecified visual disturbance: Secondary | ICD-10-CM | POA: Diagnosis not present

## 2019-05-16 NOTE — Progress Notes (Signed)
William Wheeler is a 69 y.o. male who presents to  [place] at [facility]  today, 05/16/19, seeking care for the following: Marland Kitchen Muscle cramps few days, blurred vision few days, memory changes past few weeks. Was on gabapentin for shingles pain, stopped this several weeks ago (maybe 2 weeks before other symptoms)  . Concern about insomnia, OTC meds not helping.      ASSESSMENT & PLAN with other pertinent history/findings:  The primary encounter diagnosis was Muscle cramp. Diagnoses of Vision changes, Memory loss, and Other symptoms and signs involving the nervous system were also pertinent to this visit.  R: 20/30 L: 20/40 Both: 20/20  Patient Instructions  Cramping, memory problems, and vision change are not terribly specific - I'm not sure I can give you a clear diagnosis at this time, but I have a few ideas, and I don't think anything serious or life-threatening is going on (no stroke, seizure, tumor, infection). There's a possibility that the Gabapentin was treating an underlying neurological overactivity of sorts, but you stopped this awhile ago an I would have expected the symptoms to coincide a bit closer together if the Gabapentin withdrawal was the reason.   At this point, let's get some labs done to check for other imbalance/organ dysfunction, let's strongly consider getting a brain MRI, possibly get a  neurologist involved +/- recheck with ophthalmology. I might also suggest trying restarting gabapentin to at least help with the cramps (assuming nothing else obvious shows up on labs)         Orders Placed This Encounter  Procedures  . MR Brain W Wo Contrast  . CBC  . COMPLETE METABOLIC PANEL WITH GFR  . TSH  . Urinalysis, Routine w reflex microscopic    No orders of the defined types were placed in this encounter.      Follow-up instructions: Return for RECHECK PENDING RESULTS / IF WORSE OR  CHANGE.                                         BP 134/87 (BP Location: Left Arm, Patient Position: Sitting, Cuff Size: Normal)   Pulse 68   Temp 98.1 F (36.7 C) (Oral)   Wt 226 lb 0.6 oz (102.5 kg)   BMI 29.02 kg/m   Current Meds  Medication Sig  . losartan-hydrochlorothiazide (HYZAAR) 50-12.5 MG tablet TAKE 1 TABLET BY MOUTH EVERY DAY    No results found for this or any previous visit (from the past 72 hour(s)).  No results found.  Depression screen [facility] 2/9 11/01/2018  Decreased Interest 0  Down, Depressed, Hopeless 1  PHQ - 2 Score 1  Altered sleeping 3  Tired, decreased energy 1  Change in appetite 0  Feeling bad or failure about yourself  3  Trouble concentrating 0  Moving slowly or fidgety/restless 0  Suicidal thoughts 0  PHQ-9 Score 8  Difficult doing work/chores Somewhat difficult    GAD 7 : Generalized Anxiety Score 11/01/2018  Nervous, Anxious, on Edge 1  Control/stop worrying 1  Worry too much - different things 0  Trouble relaxing 1  Restless 0  Easily annoyed or irritable 1  Afraid - awful might happen 0  Total GAD 7 Score 4  Anxiety Difficulty Somewhat difficult      All questions at time of visit were answered - patient instructed to contact  office with any additional concerns or updates.  ER/RTC precautions were reviewed with the patient.  Please note: voice recognition software was used to produce this document, and typos may escape review. Please contact Dr. Sheppard Coil for any needed clarifications.

## 2019-05-16 NOTE — Patient Instructions (Addendum)
Cramping, memory problems, and vision change are not terribly specific - I'm not sure I can give you a clear diagnosis at this time, but I have a few ideas, and I don't think anything serious or life-threatening is going on (no stroke, seizure, tumor, infection). There's a possibility that the Gabapentin was treating an underlying neurological overactivity of sorts, but you stopped this awhile ago an I would have expected the symptoms to coincide a bit closer together if the Gabapentin withdrawal was the reason.   At this point, let's get some labs done to check for other imbalance/organ dysfunction, let's strongly consider getting a brain MRI, possibly get a  neurologist involved +/- recheck with ophthalmology. I might also suggest trying restarting gabapentin to at least help with the cramps (assuming nothing else obvious shows up on labs)

## 2019-05-17 LAB — CBC
HCT: 43.1 % (ref 38.5–50.0)
Hemoglobin: 14.5 g/dL (ref 13.2–17.1)
MCH: 29.1 pg (ref 27.0–33.0)
MCHC: 33.6 g/dL (ref 32.0–36.0)
MCV: 86.5 fL (ref 80.0–100.0)
MPV: 11.6 fL (ref 7.5–12.5)
Platelets: 267 10*3/uL (ref 140–400)
RBC: 4.98 10*6/uL (ref 4.20–5.80)
RDW: 13.1 % (ref 11.0–15.0)
WBC: 9 10*3/uL (ref 3.8–10.8)

## 2019-05-17 LAB — URINALYSIS, ROUTINE W REFLEX MICROSCOPIC
Bilirubin Urine: NEGATIVE
Glucose, UA: NEGATIVE
Hgb urine dipstick: NEGATIVE
Ketones, ur: NEGATIVE
Leukocytes,Ua: NEGATIVE
Nitrite: NEGATIVE
Protein, ur: NEGATIVE
Specific Gravity, Urine: 1.024 (ref 1.001–1.03)
pH: <=5 (ref 5.0–8.0)

## 2019-05-17 LAB — TSH: TSH: 1.38 mIU/L (ref 0.40–4.50)

## 2019-05-17 LAB — COMPLETE METABOLIC PANEL WITH GFR
AG Ratio: 1.6 (calc) (ref 1.0–2.5)
ALT: 17 U/L (ref 9–46)
AST: 24 U/L (ref 10–35)
Albumin: 4.2 g/dL (ref 3.6–5.1)
Alkaline phosphatase (APISO): 36 U/L (ref 35–144)
BUN: 25 mg/dL (ref 7–25)
CO2: 25 mmol/L (ref 20–32)
Calcium: 9.9 mg/dL (ref 8.6–10.3)
Chloride: 106 mmol/L (ref 98–110)
Creat: 1.08 mg/dL (ref 0.70–1.25)
GFR, Est African American: 82 mL/min/{1.73_m2} (ref >=60)
GFR, Est Non African American: 71 mL/min/{1.73_m2} (ref >=60)
Globulin: 2.6 g/dL (calc) (ref 1.9–3.7)
Glucose, Bld: 92 mg/dL (ref 65–99)
Potassium: 4.4 mmol/L (ref 3.5–5.3)
Sodium: 139 mmol/L (ref 135–146)
Total Bilirubin: 0.3 mg/dL (ref 0.2–1.2)
Total Protein: 6.8 g/dL (ref 6.1–8.1)

## 2019-05-21 ENCOUNTER — Ambulatory Visit: Payer: Medicare Other | Attending: Internal Medicine

## 2019-05-21 DIAGNOSIS — Z23 Encounter for immunization: Secondary | ICD-10-CM

## 2019-05-21 NOTE — Progress Notes (Signed)
   Covid-19 Vaccination Clinic  Name:  William Wheeler    MRN: [id number] DOB: January 14, 1951  05/21/2019  [name] was observed post Covid-19 immunization for 15 minutes without incident. He was provided with Vaccine Information Sheet and instruction to access the V-Safe system.   [name] was instructed to call 911 with any severe reactions post vaccine: Marland Kitchen Difficulty breathing  . Swelling of face and throat  . A fast heartbeat  . A bad rash all over body  . Dizziness and weakness   Immunizations Administered    Name Date Dose VIS Date Route   Pfizer COVID-19 Vaccine 05/21/2019 11:36 AM 0.3 mL 02/15/2019 Intramuscular   Manufacturer: [place]   Lot: [id number]   [place]: [id number]

## 2019-06-03 ENCOUNTER — Other Ambulatory Visit: Payer: Medicare Other

## 2019-06-10 ENCOUNTER — Other Ambulatory Visit: Payer: Medicare Other

## 2019-06-17 ENCOUNTER — Other Ambulatory Visit: Payer: Medicare Other

## 2019-06-21 ENCOUNTER — Other Ambulatory Visit: Payer: Self-pay | Admitting: Osteopathic Medicine

## 2019-06-24 ENCOUNTER — Other Ambulatory Visit: Payer: Self-pay

## 2019-06-24 ENCOUNTER — Ambulatory Visit (INDEPENDENT_AMBULATORY_CARE_PROVIDER_SITE_OTHER): Payer: Medicare Other

## 2019-06-24 DIAGNOSIS — R29818 Other symptoms and signs involving the nervous system: Secondary | ICD-10-CM

## 2019-06-24 MED ORDER — GADOBUTROL 1 MMOL/ML IV SOLN
10.0000 mL | Freq: Once | INTRAVENOUS | Status: AC | PRN
  Administered 2019-06-24: 10:00:00 10 mL via INTRAVENOUS

## 2019-06-25 ENCOUNTER — Other Ambulatory Visit: Payer: Self-pay

## 2019-06-25 MED ORDER — ASPIRIN EC 81 MG PO TBEC: 81.0000 mg | DELAYED_RELEASE_TABLET | Freq: Every day | ORAL | 3 refills | Status: DC

## 2019-06-25 MED ORDER — LOSARTAN POTASSIUM-HCTZ 50-12.5 MG PO TABS: 1.0000 | ORAL_TABLET | Freq: Every day | ORAL | 1 refills | Status: DC

## 2019-06-25 MED ORDER — ATORVASTATIN CALCIUM 40 MG PO TABS: 40.0000 mg | ORAL_TABLET | Freq: Every day | ORAL | 3 refills | Status: DC

## 2019-06-25 MED ORDER — CLOPIDOGREL BISULFATE 75 MG PO TABS: 75.0000 mg | ORAL_TABLET | Freq: Every day | ORAL | 0 refills | Status: DC

## 2019-07-02 ENCOUNTER — Encounter: Payer: Self-pay | Admitting: Osteopathic Medicine

## 2019-07-02 ENCOUNTER — Ambulatory Visit (INDEPENDENT_AMBULATORY_CARE_PROVIDER_SITE_OTHER): Payer: Medicare Other | Admitting: Osteopathic Medicine

## 2019-07-02 VITALS — BP 148/79 | HR 63 | Ht 74.02 in | Wt 226.3 lb

## 2019-07-02 DIAGNOSIS — I639 Cerebral infarction, unspecified: Secondary | ICD-10-CM

## 2019-07-02 NOTE — Progress Notes (Signed)
William Wheeler is a 69 y.o. male who presents to  [facility] at [facility]  today, 07/02/19, seeking care for the following: . 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  Reviewed images w/ patient Discussed prevention Pt reports still having cramps (I think unrelated to CVA) no residual weakness or confusion  BP not at goal - pt forgot meds this AM! Advised home BP monitor   The ASCVD Risk score [name], et al., 2013) failed to calculate for the following reasons:   The patient has a prior MI or stroke diagnosis  BP Readings from Last 3 Encounters:  07/02/19 (!) 148/79  05/16/19 134/87  12/13/18 133/76     Patient Instructions  Stroke:  Workup:   MRI brain - done!  Echocardiogram and ultrasound of carotid arteries (look for clot/plaque that might have broken loose and caused a stroke) - ordered these tests  Management & prevention:   Antiplatelet therapy: aspirin PLUS Plavix for 21 days, then one or the other indefinitely   Statin cholesterol medication to 1) prevent future stroke and 2) to get LDL (bad cholesterol) down to ideally 70 or less  Blood pressure control: goal 130/80 or less      Orders Placed This Encounter  Procedures  . US Carotid Bilateral  . Lipid panel  . COMPLETE METABOLIC PANEL WITH GFR  . ECHOCARDIOGRAM COMPLETE    No orders of the defined types were placed in this encounter.      Follow-up instructions: Return in about 6 weeks (around 08/13/2019) for IN-OFFICE VISIT BP, labs prior to visit .                                         BP (!) 148/79 (BP Location: Left Arm, Patient Position: Sitting, Cuff Size: Normal)   Pulse 63   Ht 6' 2.02" (1.88 m)   Wt 226 lb 4.8 oz (102.6 kg)   SpO2 97%   BMI 29.04 kg/m   Current  Meds  Medication Sig  . aspirin EC 81 MG tablet Take 1 tablet (81 mg total) by mouth daily.  Marland Kitchen atorvastatin (LIPITOR) 40 MG tablet Take 1 tablet (40 mg total) by mouth daily.  . clopidogrel (PLAVIX) 75 MG tablet Take 1 tablet (75 mg total) by mouth daily.  Marland Kitchen losartan-hydrochlorothiazide (HYZAAR) 50-12.5 MG tablet Take 1 tablet by mouth daily.    No results found for this or any previous visit (from the past 72 hour(s)).  No results found.  Depression screen [facility] 2/9 07/02/2019 11/01/2018  Decreased Interest 0 0  Down, Depressed, Hopeless 0 1  PHQ - 2 Score 0 1  Altered sleeping 1 3  Tired, decreased energy 0 1  Change in appetite 0 0  Feeling bad or failure about yourself  0 3  Trouble concentrating 1 0  Moving slowly or fidgety/restless 0 0  Suicidal thoughts 0 0  PHQ-9 Score 2 8  Difficult doing work/chores Not difficult at all Somewhat difficult    GAD 7 : Generalized Anxiety Score 07/02/2019 11/01/2018  Nervous, Anxious, on Edge 1 1  Control/stop worrying 1 1  Worry too much - different things 0 0  Trouble relaxing 1 1  Restless 0 0  Easily annoyed or irritable  0 1  Afraid - awful might happen 1 0  Total GAD 7 Score 4 4  Anxiety Difficulty Not difficult at all Somewhat difficult      All questions at time of visit were answered - patient instructed to contact office with any additional concerns or updates.  ER/RTC precautions were reviewed with the patient.  Please note: voice recognition software was used to produce this document, and typos may escape review. Please contact Dr. Sheppard Coil for any needed clarifications.   Total encounter time: 30 minutes.

## 2019-07-02 NOTE — Patient Instructions (Signed)
Stroke:  Workup:   MRI brain - done!  Echocardiogram and ultrasound of carotid arteries (look for clot/plaque that might have broken loose and caused a stroke) - ordered these tests  Management & prevention:   Antiplatelet therapy: aspirin PLUS Plavix for 21 days, then one or the other indefinitely   Statin cholesterol medication to 1) prevent future stroke and 2) to get LDL (bad cholesterol) down to ideally 70 or less  Blood pressure control: goal 130/80 or less

## 2019-07-09 ENCOUNTER — Ambulatory Visit: Payer: Medicare Other

## 2019-07-09 ENCOUNTER — Other Ambulatory Visit: Payer: Self-pay

## 2019-07-15 ENCOUNTER — Other Ambulatory Visit (HOSPITAL_BASED_OUTPATIENT_CLINIC_OR_DEPARTMENT_OTHER): Payer: Medicare Other

## 2019-07-16 ENCOUNTER — Other Ambulatory Visit: Payer: Self-pay

## 2019-07-16 ENCOUNTER — Ambulatory Visit (HOSPITAL_BASED_OUTPATIENT_CLINIC_OR_DEPARTMENT_OTHER)
Admission: RE | Admit: 2019-07-16 | Discharge: 2019-07-16 | Disposition: A | Discharge status: Home / Self Care | Payer: Medicare Other | Source: Ambulatory Visit | Attending: Osteopathic Medicine | Admitting: Osteopathic Medicine

## 2019-07-16 DIAGNOSIS — I6389 Other cerebral infarction: Secondary | ICD-10-CM

## 2019-07-16 DIAGNOSIS — I639 Cerebral infarction, unspecified: Secondary | ICD-10-CM | POA: Diagnosis not present

## 2019-07-16 DIAGNOSIS — I351 Nonrheumatic aortic (valve) insufficiency: Secondary | ICD-10-CM | POA: Diagnosis not present

## 2019-07-16 DIAGNOSIS — I10 Essential (primary) hypertension: Secondary | ICD-10-CM | POA: Insufficient documentation

## 2019-07-16 NOTE — Progress Notes (Signed)
  Echocardiogram 2D Echocardiogram has been performed.  William Wheeler 07/16/2019, 9:57 AM

## 2019-07-28 ENCOUNTER — Other Ambulatory Visit: Payer: Self-pay | Admitting: Osteopathic Medicine

## 2019-08-13 ENCOUNTER — Ambulatory Visit (INDEPENDENT_AMBULATORY_CARE_PROVIDER_SITE_OTHER): Payer: Medicare Other | Admitting: Osteopathic Medicine

## 2019-08-13 ENCOUNTER — Encounter: Payer: Self-pay | Admitting: Osteopathic Medicine

## 2019-08-13 ENCOUNTER — Other Ambulatory Visit: Payer: Self-pay

## 2019-08-13 VITALS — BP 137/86 | HR 54 | Temp 97.5°F | Wt 228.1 lb

## 2019-08-13 DIAGNOSIS — I639 Cerebral infarction, unspecified: Secondary | ICD-10-CM | POA: Diagnosis not present

## 2019-08-13 DIAGNOSIS — H539 Unspecified visual disturbance: Secondary | ICD-10-CM | POA: Diagnosis not present

## 2019-08-13 DIAGNOSIS — B0229 Other postherpetic nervous system involvement: Secondary | ICD-10-CM

## 2019-08-13 DIAGNOSIS — Z8673 Personal history of transient ischemic attack (TIA), and cerebral infarction without residual deficits: Secondary | ICD-10-CM | POA: Diagnosis not present

## 2019-08-13 MED ORDER — CLOPIDOGREL BISULFATE 75 MG PO TABS: 75.0000 mg | ORAL_TABLET | Freq: Every day | ORAL | 3 refills | Status: DC

## 2019-08-13 NOTE — Progress Notes (Signed)
William Wheeler is a 69 y.o. male who presents to  [place] at [facility]  today, 08/13/19, seeking care for the following: . Follow-up BP, results. CVA w/ no residual deficits   BP Readings from Last 3 Encounters:  08/13/19 137/86  07/02/19 (!) 148/79  05/16/19 134/87      ASSESSMENT & PLAN with other pertinent history/findings:  The primary encounter diagnosis was History of CVA (cerebrovascular accident) without residual deficits. Diagnoses of Vision changes and Post herpetic neuralgia were also pertinent to this visit.    Patient Instructions  Workup:   MRI brain - done!  Echocardiogram and ultrasound of carotid arteries (look for clot/plaque that might have broken loose and caused a stroke) - Echo normal, no heart abnormality. Carotid US showed some cholesterol plaque which is not unusual, but nothing that's compromising blood flow to require surgery.   Management & prevention:   Antiplatelet therapy: Plavix indefinitely to prevent stroke   Statin cholesterol medication to 1) prevent future stroke and 2) to get LDL (bad cholesterol) down to ideally 70 or less - need labs!   Blood pressure control: goal 130/80 or less  - better!   Refer ophtho  Avoid caffeine - urinary issue   Try Voltatren / diclofenac OTC gel for skin pain       Orders Placed This Encounter  Procedures  . Ambulatory referral to Ophthalmology    Meds ordered this encounter  Medications  . clopidogrel (PLAVIX) 75 MG tablet    Sig: Take 1 tablet (75 mg total) by mouth daily.    Dispense:  90 tablet    Refill:  3       Follow-up instructions: Return in about 3 months (around 11/13/2019) for follow up BP and weight, etc. .                                         BP 137/86 (BP Location: Left Arm, Patient Position: Sitting, Cuff Size: Normal)   Pulse (!) 54   Temp (!) 97.5 F (36.4 C) (Oral)   Wt 228 lb  1.9 oz (103.5 kg)   BMI 29.28 kg/m   Current Meds  Medication Sig  . aspirin EC 81 MG tablet Take 1 tablet (81 mg total) by mouth daily.  Marland Kitchen atorvastatin (LIPITOR) 40 MG tablet Take 1 tablet (40 mg total) by mouth daily.  . clopidogrel (PLAVIX) 75 MG tablet Take 1 tablet (75 mg total) by mouth daily.  Marland Kitchen losartan-hydrochlorothiazide (HYZAAR) 50-12.5 MG tablet Take 1 tablet by mouth daily.  . [DISCONTINUED] clopidogrel (PLAVIX) 75 MG tablet TAKE 1 TABLET BY MOUTH EVERY DAY    No results found for this or any previous visit (from the past 72 hour(s)).  No results found.  Depression screen [facility] 2/9 07/02/2019 11/01/2018  Decreased Interest 0 0  Down, Depressed, Hopeless 0 1  PHQ - 2 Score 0 1  Altered sleeping 1 3  Tired, decreased energy 0 1  Change in appetite 0 0  Feeling bad or failure about yourself  0 3  Trouble concentrating 1 0  Moving slowly or fidgety/restless 0 0  Suicidal thoughts 0 0  PHQ-9 Score 2 8  Difficult doing work/chores Not difficult at all Somewhat difficult    GAD 7 : Generalized Anxiety Score 07/02/2019 11/01/2018  Nervous, Anxious, on Edge 1 1  Control/stop worrying 1  1  Worry too much - different things 0 0  Trouble relaxing 1 1  Restless 0 0  Easily annoyed or irritable 0 1  Afraid - awful might happen 1 0  Total GAD 7 Score 4 4  Anxiety Difficulty Not difficult at all Somewhat difficult      All questions at time of visit were answered - patient instructed to contact office with any additional concerns or updates.  ER/RTC precautions were reviewed with the patient.  Please note: voice recognition software was used to produce this document, and typos may escape review. Please contact Dr. Sheppard Coil for any needed clarifications.

## 2019-08-13 NOTE — Patient Instructions (Addendum)
Workup:   MRI brain - done!  Echocardiogram and ultrasound of carotid arteries (look for clot/plaque that might have broken loose and caused a stroke) - Echo normal, no heart abnormality. Carotid US showed some cholesterol plaque which is not unusual, but nothing that's compromising blood flow to require surgery.   Management & prevention:   Antiplatelet therapy: Plavix indefinitely to prevent stroke   Statin cholesterol medication to 1) prevent future stroke and 2) to get LDL (bad cholesterol) down to ideally 70 or less - need labs!   Blood pressure control: goal 130/80 or less  - better!   Refer ophtho  Avoid caffeine - urinary issue   Try Voltatren / diclofenac OTC gel for skin pain

## 2019-09-25 ENCOUNTER — Encounter: Payer: Self-pay | Admitting: Osteopathic Medicine

## 2019-10-08 LAB — LIPID PANEL
Cholesterol: 129 mg/dL (ref <200)
HDL: 47 mg/dL (ref >=40)
LDL Cholesterol (Calc): 68 mg/dL (calc)
Non-HDL Cholesterol (Calc): 82 mg/dL (calc) (ref <130)
Total CHOL/HDL Ratio: 2.7 (calc) (ref <5.0)
Triglycerides: 61 mg/dL (ref <150)

## 2019-10-08 LAB — COMPLETE METABOLIC PANEL WITH GFR
AG Ratio: 1.6 (calc) (ref 1.0–2.5)
ALT: 16 U/L (ref 9–46)
AST: 17 U/L (ref 10–35)
Albumin: 4.2 g/dL (ref 3.6–5.1)
Alkaline phosphatase (APISO): 40 U/L (ref 35–144)
BUN: 19 mg/dL (ref 7–25)
CO2: 26 mmol/L (ref 20–32)
Calcium: 9.9 mg/dL (ref 8.6–10.3)
Chloride: 107 mmol/L (ref 98–110)
Creat: 1.15 mg/dL (ref 0.70–1.25)
GFR, Est African American: 76 mL/min/{1.73_m2} (ref >=60)
GFR, Est Non African American: 66 mL/min/{1.73_m2} (ref >=60)
Globulin: 2.7 g/dL (calc) (ref 1.9–3.7)
Glucose, Bld: 100 mg/dL — ABNORMAL HIGH (ref 65–99)
Potassium: 4.5 mmol/L (ref 3.5–5.3)
Sodium: 141 mmol/L (ref 135–146)
Total Bilirubin: 0.5 mg/dL (ref 0.2–1.2)
Total Protein: 6.9 g/dL (ref 6.1–8.1)

## 2019-10-10 ENCOUNTER — Other Ambulatory Visit: Payer: Self-pay

## 2019-10-10 ENCOUNTER — Ambulatory Visit (INDEPENDENT_AMBULATORY_CARE_PROVIDER_SITE_OTHER): Payer: Medicare Other | Admitting: Osteopathic Medicine

## 2019-10-10 VITALS — BP 121/66 | HR 56 | Wt 221.0 lb

## 2019-10-10 DIAGNOSIS — K4090 Unilateral inguinal hernia, without obstruction or gangrene, not specified as recurrent: Secondary | ICD-10-CM

## 2019-10-10 DIAGNOSIS — R252 Cramp and spasm: Secondary | ICD-10-CM

## 2019-10-10 DIAGNOSIS — I639 Cerebral infarction, unspecified: Secondary | ICD-10-CM | POA: Diagnosis not present

## 2019-10-10 NOTE — Progress Notes (Signed)
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 male who presents to  [place] at [facility]  today, 10/10/19, seeking care for the following:  . Concern for inguinal hernia - started 2 weeks ago, uncomfortable but no pain, feels like a mass/lump. Palpable inguinal hernia on R      ASSESSMENT & PLAN with other pertinent findings:  The primary encounter diagnosis was Non-recurrent unilateral inguinal hernia without obstruction or gangrene. A diagnosis of Muscle cramping was also pertinent to this visit.    Patient Instructions   Hernia - will refer to general surgery to discuss options   Leg cramps - pickle juice is fine! If severe symptoms such that we might consider daily/nightly medications, let me know.   Hand cramping - may try wrist brace when driving?    Orders Placed This Encounter  Procedures  . Ambulatory referral to General Surgery       Follow-up instructions: Return if symptoms worsen or fail to improve.                                         BP 121/66   Pulse (!) 56   Wt 221 lb (100.2 kg)   BMI 28.36 kg/m   No outpatient medications have been marked as taking for the 10/10/19 encounter (Office Visit) with Emeterio Reeve, DO.    No results found.     All questions at time of visit were answered - patient instructed to contact office with any additional concerns or updates.  ER/RTC precautions were reviewed with the patient as applicable.   Please note: voice recognition software was used to produce this document, and typos may escape review. Please contact Dr. Sheppard Coil for any needed clarifications.

## 2019-10-10 NOTE — Patient Instructions (Signed)
   Hernia - will refer to general surgery to discuss options   Leg cramps - pickle juice is fine! If severe symptoms such that we might consider daily/nightly medications, let me know.   Hand cramping - may try wrist brace when driving?

## 2019-11-12 ENCOUNTER — Ambulatory Visit: Payer: Medicare Other | Admitting: Osteopathic Medicine

## 2019-12-05 ENCOUNTER — Encounter: Payer: Self-pay | Admitting: Family Medicine

## 2019-12-05 ENCOUNTER — Ambulatory Visit (INDEPENDENT_AMBULATORY_CARE_PROVIDER_SITE_OTHER): Payer: Medicare Other | Admitting: Family Medicine

## 2019-12-05 DIAGNOSIS — K4090 Unilateral inguinal hernia, without obstruction or gangrene, not specified as recurrent: Secondary | ICD-10-CM | POA: Diagnosis not present

## 2019-12-05 NOTE — Assessment & Plan Note (Signed)
Transient pain yesterday with coughing which has resolved today.  Hernia is non-tender and reducible.  Having some constipation, suggested increased fiber and addition of miralax as needed.  Red flags reviewed including worsening pain that stays, tenderness or firmness over hernia, vomiting, fever or chills.  Instructed to seek emergency care if these occur.  Keep appt for surgery in December.

## 2019-12-05 NOTE — Progress Notes (Signed)
William Wheeler - 69 y.o. male MRN [id number]  Date of birth: 09/07/1950  Subjective Chief Complaint  Patient presents with  . Constipation  . hernia complications    HPI William Wheeler is a 69 y.o. male here today to discuss hernia.  He was diagnosed with hernia at office visit in August with Dr. Sheppard Coil.  He has appt to have this repaired in December.  His concern today is that he felt sharp, stabbing pain with coughing yesterday.  This has resolved today.  He has not had nausea, fever, tenderness over hernia.  He has had issues with bowel movements and constipation since he first noticed the hernia.  He did try docusate without much improvement.  He is still having bowel movement.  ROS:  A comprehensive ROS was completed and negative except as noted per HPI  No Known Allergies  Past Medical History:  Diagnosis Date  . Alcohol abuse   . 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 Colon polyps   . Diverticulitis   . Hypertension     Past Surgical History:  Procedure Laterality Date  . broken bone repair     Nose    Social History   Socioeconomic History  . Marital status: Married    Spouse name: Not on file  . Number of children: Not on file  . Years of education: Not on file  . Highest education level: Not on file  Occupational History  . Not on file  Tobacco Use  . Smoking status: Former Smoker    Packs/day: 2.50    Years: 29.00    Pack years: 72.50    Quit date: 2001    Years since quitting: 20.7  . Smokeless tobacco: Never Used  Vaping Use  . Vaping Use: Never used  Substance and Sexual Activity  . Alcohol use: Not Currently  . Drug use: Not Currently    Types: Marijuana  . Sexual activity: Not Currently    Partners: Female  Other Topics Concern  . Not on file  Social History Narrative  . Not on file   Social Determinants of Health   Financial Resource Strain:   . Difficulty of Paying Living Expenses: Not on file  Food Insecurity:   . Worried About Ship broker in the Last Year: Not on file  . Ran Out of Food in the Last Year: Not on file  Transportation Needs:   . Lack of Transportation (Medical): Not on file  . Lack of Transportation (Non-Medical): Not on file  Physical Activity:   . Days of Exercise per Week: Not on file  . Minutes of Exercise per Session: Not on file  Stress:   . Feeling of Stress : Not on file  Social Connections:   . Frequency of Communication with Friends and Family: Not on file  . Frequency of Social Gatherings with Friends and Family: Not on file  . Attends Religious Services: Not on file  . Active Member of Clubs or Organizations: Not on file  . Attends Archivist Meetings: Not on file  . Marital Status: Not on file    History reviewed. No pertinent family history.  Health Maintenance  Topic Date Due  . Hepatitis C Screening  Never done  . TETANUS/TDAP  Never done  . COLONOSCOPY  Never done  . PNA vac Low Risk Adult (1 of 2 - PCV13) Never done  . INFLUENZA VACCINE  10/06/2019  . COVID-19 Vaccine  Completed     -----------------------------------------------------------------------------------------------------------------------------------------------------------------------------------------------------------------  Physical Exam BP 119/68 (BP Location: Left Arm, Patient Position: Sitting, Cuff Size: Normal)   Pulse (!) 57   Wt 210 lb 4.8 oz (95.4 kg)   SpO2 96%   BMI 26.99 kg/m   Physical Exam Constitutional:      Appearance: Normal appearance.  HENT:     Head: Normocephalic and atraumatic.  Eyes:     General: No scleral icterus. Abdominal:     Comments: R inguinal hernia.  Non-tender, reducible.   Neurological:     General: No focal deficit present.     Mental Status: He is alert.  Psychiatric:        Mood and Affect: Mood normal.        Behavior: Behavior normal.      ------------------------------------------------------------------------------------------------------------------------------------------------------------------------------------------------------------------- Assessment and Plan  Inguinal hernia Transient pain yesterday with coughing which has resolved today.  Hernia is non-tender and reducible.  Having some constipation, suggested increased fiber and addition of miralax as needed.  Red flags reviewed including worsening pain that stays, tenderness or firmness over hernia, vomiting, fever or chills.  Instructed to seek emergency care if these occur.  Keep appt for surgery in December.   30 minutes spent including pre visit preparation, review of prior notes and labs, encounter with patient  and same day documentation.   No orders of the defined types were placed in this encounter.   No follow-ups on file.    This visit occurred during the SARS-CoV-2 public health emergency.  Safety protocols were in place, including screening questions prior to the visit, additional usage of staff PPE, and extensive cleaning of exam room while observing appropriate contact time as indicated for disinfecting solutions.

## 2019-12-05 NOTE — Patient Instructions (Signed)
Hernia, Adult     A hernia is the bulging of an organ or tissue through a weak spot in the muscles of the abdomen (abdominal wall). Hernias develop most often near the belly button (navel) or the area where the leg meets the lower abdomen (groin). Common types of hernias include:  Incisional hernia. This type bulges through a scar from an abdominal surgery.  Umbilical hernia. This type develops near the navel.  Inguinal hernia. This type develops in the groin or scrotum.  Femoral hernia. This type develops under the groin, in the upper thigh area.  Hiatal hernia. This type occurs when part of the stomach slides above the muscle that separates the abdomen from the chest (diaphragm). What are the causes? This condition may be caused by:  Heavy lifting.  Coughing over a long period of time.  Straining to have a bowel movement. Constipation can lead to straining.  An incision made during an abdominal surgery.  A physical problem that is present at birth (congenital defect).  Being overweight or obese.  Smoking.  Excess fluid in the abdomen.  Undescended testicles in males. What are the signs or symptoms? The main symptom is a skin-colored, rounded bulge in the area of the hernia. However, a bulge may not always be present. It may grow bigger or be more visible when you cough or strain (such as when lifting something heavy). A hernia that can be pushed back into the area (is reducible) rarely causes pain. A hernia that cannot be pushed back into the area (is incarcerated) may lose its blood supply (become strangulated). A hernia that is incarcerated may cause:  Pain.  Fever.  Nausea and vomiting.  Swelling.  Constipation. How is this diagnosed? A hernia may be diagnosed based on:  Your symptoms and medical history.  A physical exam. Your health care provider may ask you to cough or move in certain ways to see if the hernia becomes visible.  Imaging tests, such  as: ? X-rays. ? Ultrasound. ? CT scan. How is this treated? A hernia that is small and painless may not need to be treated. A hernia that is large or painful may be treated with surgery. Inguinal hernias may be treated with surgery to prevent incarceration or strangulation. Strangulated hernias are always treated with surgery because a lack of blood supply to the trapped organ or tissue can cause it to die. Surgery to treat a hernia involves pushing the bulge back into place and repairing the weak area of the muscle or abdominal wall. Follow these instructions at home: Activity  Avoid straining.  Do not lift anything that is heavier than 10 lb (4.5 kg), or the limit that you are told, until your health care provider says that it is safe.  When lifting heavy objects, lift with your leg muscles, not your back muscles. Preventing constipation  Take actions to prevent constipation. Constipation leads to straining with bowel movements, which can make a hernia worse or cause a hernia repair to break down. Your health care provider may recommend that you: ? Drink enough fluid to keep your urine pale yellow. ? Eat foods that are high in fiber, such as fresh fruits and vegetables, whole grains, and beans. ? Limit foods that are high in fat and processed sugars, such as fried or sweet foods. ? Take an over-the-counter or prescription medicine for constipation. General instructions  When coughing, try to cough gently.  You may try to push the hernia back in place   by very gently pressing on it while lying down. Do not try to force the bulge back in if it will not push in easily.  If you are overweight, work with your health care provider to lose weight safely.  Do not use any products that contain nicotine or tobacco, such as cigarettes and e-cigarettes. If you need help quitting, ask your health care provider.  If you are scheduled for hernia repair, watch your hernia for any changes in shape,  size, or color. Tell your health care provider about any changes or new symptoms.  Take over-the-counter and prescription medicines only as told by your health care provider.  Keep all follow-up visits as told by your health care provider. This is important. Contact a health care provider if:  You develop new pain, swelling, or redness around your hernia.  You have signs of constipation, such as: ? Fewer bowel movements in a week than normal. ? Difficulty having a bowel movement. ? Stools that are dry, hard, or larger than normal. Get help right away if:  You have a fever.  You have abdomen pain that gets worse.  You feel nauseous or you vomit.  You cannot push the hernia back in place by very gently pressing on it while lying down. Do not try to force the bulge back in if it will not push in easily.  The hernia: ? Changes in shape, size, or color. ? Feels hard or tender. These symptoms may represent a serious problem that is an emergency. Do not wait to see if the symptoms will go away. Get medical help right away. Call your local emergency services (911 in the [place] Summary  A hernia is the bulging of an organ or tissue through a weak spot in the muscles of the abdomen (abdominal wall).  The main symptom is a skin-colored, rounded lump (bulge) in the hernia area. However, a bulge may not always be present. It may grow bigger or more visible when you cough or strain (such as when having a bowel movement).  A hernia that is small and painless may not need to be treated. A hernia that is large or painful may be treated with surgery.  Surgery to treat a hernia involves pushing the bulge back into place and repairing the weak part of the abdomen. This information is not intended to replace advice given to you by your health care provider. Make sure you discuss any questions you have with your health care provider. Document Revised: 06/14/2018 Document Reviewed: 11/23/2016 Elsevier  Patient Education  2020 Elsevier Inc.  

## 2020-03-14 ENCOUNTER — Other Ambulatory Visit: Payer: Self-pay | Admitting: Osteopathic Medicine

## 2020-03-27 ENCOUNTER — Emergency Department (INDEPENDENT_AMBULATORY_CARE_PROVIDER_SITE_OTHER)
Admission: EM | Admit: 2020-03-27 | Discharge: 2020-03-27 | Disposition: A | Discharge status: Home / Self Care | Payer: Medicare Other | Source: Home / Self Care | Attending: Family Medicine | Admitting: Family Medicine

## 2020-03-27 ENCOUNTER — Other Ambulatory Visit: Payer: Self-pay

## 2020-03-27 DIAGNOSIS — K1121 Acute sialoadenitis: Secondary | ICD-10-CM | POA: Diagnosis not present

## 2020-03-27 HISTORY — DX: Cerebral infarction, unspecified: I63.9

## 2020-03-27 HISTORY — DX: Pure hypercholesterolemia, unspecified: E78.00

## 2020-03-27 MED ORDER — AMOXICILLIN-POT CLAVULANATE 875-125 MG PO TABS
1.0000 | ORAL_TABLET | Freq: Two times a day (BID) | ORAL | 0 refills | Status: DC
Start: 2020-03-27 — End: 2022-12-21

## 2020-03-27 NOTE — Discharge Instructions (Addendum)
Try sucking on sour candy. This may help to make your mouth less dry by stimulating the flow of saliva. Apply heat to the affected area 2 or 3 times daily.  Use a moist heat pack or a heating pad. To apply the heat: Place a towel between your skin and the heat source. Leave the heat on for 20-30 minutes. Remove the heat if your skin turns bright red. This is especially important if you are unable to feel pain, heat, or cold. You may have a greater risk of getting burned. If symptoms become significantly worse during the night or over the weekend, proceed to the local emergency room.

## 2020-03-27 NOTE — ED Provider Notes (Signed)
[facility]    CSN: [id number] Arrival date & time: 03/27/20  0859      History   Chief Complaint Chief Complaint  Patient presents with  . Facial Swelling  . Otalgia    HPI William Wheeler is a 70 y.o. male.   Two days ago patient developed swelling in his right jaw below the ear, with onset of pain yesterday.  He has had chills without fever.  He has right ear pain with jaw movement.  The history is provided by the patient.  Otalgia Location:  Right Behind ear:  No abnormality Quality:  Aching Severity:  Mild Onset quality:  Sudden Duration:  2 days Timing:  Constant Progression:  Worsening Chronicity:  New Context: not recent URI   Worsened by:  Palpation Ineffective treatments:  OTC medications Associated symptoms: no ear discharge, no fever, no headaches, no neck pain, no rash and no rhinorrhea     Past Medical History:  Diagnosis Date  . Alcohol abuse   . 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 Colon polyps   . Diverticulitis   . Hypercholesteremia   . Hypertension   . Stroke [facility])     Patient Active Problem List   Diagnosis Date Noted  . Inguinal hernia 12/05/2019  . History of CVA (cerebrovascular accident) without residual deficits 08/13/2019  . Ischemic cerebrovascular accident (CVA) of frontal lobe ([place]) 07/02/2019  . 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 Cervical stenosis of spinal canal 11/16/2018  . 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 Herpes zoster without complication 40/12/2723  . Essential hypertension 11/01/2018  . Alcoholism in remission ([place]) 11/01/2018  . 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 History of colon polyps 11/01/2018  . History of colonic diverticulitis 11/01/2018  . Paresthesia of left arm 11/01/2018  . Nocturia 11/01/2018  . Arm numbness left 11/01/2018    Past Surgical History:  Procedure Laterality Date  . broken bone  repair     Nose       Home Medications    Prior to Admission medications   Medication Sig Start Date End Date Taking? Authorizing Provider  acetaminophen (TYLENOL) 325 MG tablet Take 650 mg by mouth every 6 (six) hours as needed.   Yes [provider]  amoxicillin-clavulanate (AUGMENTIN) 875-125 MG tablet Take 1 tablet by mouth every 12 (twelve) hours for 10 days. Take with food. 03/27/20 04/06/20 Yes Kandra Nicolas, MD  ibuprofen (ADVIL) 200 MG tablet Take 200 mg by mouth every 6 (six) hours as needed.   Yes [provider]  atorvastatin (LIPITOR) 40 MG tablet Take 1 tablet (40 mg total) by mouth daily. 06/25/19   Emeterio Reeve, DO  clopidogrel (PLAVIX) 75 MG tablet Take 1 tablet (75 mg total) by mouth daily. 08/13/19   Emeterio Reeve, DO  losartan-hydrochlorothiazide (HYZAAR) 50-12.5 MG tablet Take 1 tablet by mouth daily. appt for refills 03/15/20   Emeterio Reeve, DO    Family History Family History  Problem Relation Age of Onset  . Leukemia Mother   . Non-Hodgkin's lymphoma Mother     Social History Social History   Tobacco Use  . Smoking status: Former Smoker    Packs/day: 2.50    Years: 29.00    Pack years: 72.50    Quit date: 2001    Years since quitting: 21.0  . Smokeless tobacco: Never Used  Vaping Use  . Vaping Use:  Never used  Substance Use Topics  . Alcohol use: Not Currently  . Drug use: Not Currently    Types: Marijuana     Allergies   Patient has no known allergies.   Review of Systems Review of Systems  Constitutional: Positive for chills. Negative for diaphoresis, fatigue and fever.  HENT: Positive for ear pain and facial swelling. Negative for ear discharge, rhinorrhea, sinus pain and trouble swallowing.   Eyes: Negative.   Respiratory: Negative.   Cardiovascular: Negative.   Gastrointestinal: Negative.   Genitourinary: Negative.   Musculoskeletal: Negative for neck pain.  Skin: Negative for rash.  Neurological:  Negative for headaches.     Physical Exam Triage Vital Signs ED Triage Vitals  Enc Vitals Group     BP 03/27/20 0918 134/84     Pulse Rate 03/27/20 0918 61     Resp 03/27/20 0918 18     Temp 03/27/20 0918 98.2 F (36.8 C)     Temp Source 03/27/20 0918 Oral     SpO2 03/27/20 0918 97 %     Weight 03/27/20 0913 211 lb (95.7 kg)     Height 03/27/20 0913 6\' 2"  (1.88 m)     Head Circumference --      Peak Flow --      Pain Score 03/27/20 0913 3     Pain Loc --      Pain Edu? --      Excl. in [place]? --    No data found.  Updated Vital Signs BP 134/84 (BP Location: Right Arm)   Pulse 61   Temp 98.2 F (36.8 C) (Oral)   Resp 18   Ht 6\' 2"  (1.88 m)   Wt 95.7 kg   SpO2 97%   BMI 27.09 kg/m   Visual Acuity Right Eye Distance:   Left Eye Distance:   Bilateral Distance:    Right Eye Near:   Left Eye Near:    Bilateral Near:     Physical Exam Vitals and nursing note reviewed.  Constitutional:      General: He is not in acute distress. HENT:     Head: Normocephalic.     Jaw: No trismus.     Salivary Glands: Right salivary gland is diffusely enlarged.      Comments: There is tenderness to palpation and mild swelling over the right inferior parotid gland.    Right Ear: Tympanic membrane, ear canal and external ear normal.     Left Ear: Tympanic membrane, ear canal and external ear normal.     Nose: Nose normal.     Mouth/Throat:     Pharynx: Oropharynx is clear.     Comments: Mouth:  No right tooth or gingiva tenderness to palpation  Eyes:     Conjunctiva/sclera: Conjunctivae normal.     Pupils: Pupils are equal, round, and reactive to light.  Cardiovascular:     Rate and Rhythm: Normal rate.     Heart sounds: Normal heart sounds.  Pulmonary:     Breath sounds: Normal breath sounds.  Musculoskeletal:     Cervical back: Neck supple.  Lymphadenopathy:     Cervical: No cervical adenopathy.  Skin:    General: Skin is warm and dry.     Findings: No rash.   Neurological:     Mental Status: He is alert.      UC Treatments / Results  Labs (all labs ordered are listed, but only abnormal results are displayed) Labs Reviewed - No data  to display  EKG   Radiology No results found.  Procedures Procedures (including critical care time)  Medications Ordered in UC Medications - No data to display  Initial Impression / Assessment and Plan / UC Course  I have reviewed the triage vital signs and the nursing notes.  Pertinent labs & imaging results that were available during my care of the patient were reviewed by me and considered in my medical decision making (see chart for details).    Begin Augmentin. Followup with ENT if not improved one week.  Final Clinical Impressions(s) / UC Diagnoses   Final diagnoses:  Parotitis, acute     Discharge Instructions     Try sucking on sour candy. This may help to make your mouth less dry by stimulating the flow of saliva. Apply heat to the affected area 2 or 3 times daily.  Use a moist heat pack or a heating pad. To apply the heat: ? Place a towel between your skin and the heat source. ? Leave the heat on for 20-30 minutes. ? Remove the heat if your skin turns bright red. This is especially important if you are unable to feel pain, heat, or cold. You may have a greater risk of getting burned. If symptoms become significantly worse during the night or over the weekend, proceed to the local emergency room.    ED Prescriptions    Medication Sig Dispense Auth. Provider   amoxicillin-clavulanate (AUGMENTIN) 875-125 MG tablet Take 1 tablet by mouth every 12 (twelve) hours for 10 days. Take with food. 20 tablet Kandra Nicolas, MD        Kandra Nicolas, MD 03/28/20 Bosie Helper

## 2020-03-27 NOTE — ED Triage Notes (Signed)
Pt presents to Urgent Care with c/o R jaw swelling and pain since yesterday. Also c/o R ear pain when moving jaw.

## 2020-05-05 DIAGNOSIS — H0100B Unspecified blepharitis left eye, upper and lower eyelids: Secondary | ICD-10-CM | POA: Diagnosis not present

## 2020-05-05 DIAGNOSIS — H0100A Unspecified blepharitis right eye, upper and lower eyelids: Secondary | ICD-10-CM | POA: Diagnosis not present

## 2020-05-05 DIAGNOSIS — H43812 Vitreous degeneration, left eye: Secondary | ICD-10-CM | POA: Diagnosis not present

## 2020-05-05 DIAGNOSIS — H04123 Dry eye syndrome of bilateral lacrimal glands: Secondary | ICD-10-CM | POA: Diagnosis not present

## 2020-05-26 ENCOUNTER — Other Ambulatory Visit: Payer: Self-pay

## 2020-05-26 ENCOUNTER — Ambulatory Visit (INDEPENDENT_AMBULATORY_CARE_PROVIDER_SITE_OTHER): Payer: Medicare Other | Admitting: Osteopathic Medicine

## 2020-05-26 ENCOUNTER — Encounter: Payer: Self-pay | Admitting: Osteopathic Medicine

## 2020-05-26 VITALS — BP 128/81 | HR 60 | Resp 20 | Ht 74.0 in | Wt 215.0 lb

## 2020-05-26 DIAGNOSIS — I10 Essential (primary) hypertension: Secondary | ICD-10-CM | POA: Diagnosis not present

## 2020-05-26 DIAGNOSIS — Z8673 Personal history of transient ischemic attack (TIA), and cerebral infarction without residual deficits: Secondary | ICD-10-CM | POA: Diagnosis not present

## 2020-05-26 DIAGNOSIS — I639 Cerebral infarction, unspecified: Secondary | ICD-10-CM | POA: Diagnosis not present

## 2020-05-26 DIAGNOSIS — Z1211 Encounter for screening for malignant neoplasm of colon: Secondary | ICD-10-CM

## 2020-05-26 DIAGNOSIS — M4802 Spinal stenosis, cervical region: Secondary | ICD-10-CM | POA: Diagnosis not present

## 2020-05-26 DIAGNOSIS — B0229 Other postherpetic nervous system involvement: Secondary | ICD-10-CM | POA: Diagnosis not present

## 2020-05-26 MED ORDER — ATORVASTATIN CALCIUM 40 MG PO TABS: 40.0000 mg | ORAL_TABLET | Freq: Every day | ORAL | 3 refills | Status: DC

## 2020-05-26 MED ORDER — LOSARTAN POTASSIUM-HCTZ 50-12.5 MG PO TABS: 1.0000 | ORAL_TABLET | Freq: Every day | ORAL | 3 refills | Status: DC

## 2020-05-26 MED ORDER — ROGAINE MENS EXTRA STRENGTH 5 % EX FOAM: 1.0000 "application " | Freq: Two times a day (BID) | CUTANEOUS | 3 refills | Status: DC

## 2020-05-26 MED ORDER — CLOPIDOGREL BISULFATE 75 MG PO TABS: 75.0000 mg | ORAL_TABLET | Freq: Every day | ORAL | 3 refills | Status: DC

## 2020-05-26 NOTE — Patient Instructions (Addendum)
Transient Ischemic Attack A transient ischemic attack (TIA) causes stroke-like symptoms that go away quickly. Having a TIA means that a person is at higher risk for a stroke. A TIA happens when blood supply to the brain is blocked temporarily. A TIA is a medical emergency. What are the causes? This condition is caused by a temporary blockage in an artery in the head or neck. This means the brain does not get the blood supply it needs. There is no permanent brain damage with a TIA. A blockage can be caused by:  Fatty buildup in an artery in the head or neck (atherosclerosis).  A blood clot.  An artery tear (dissection).  Inflammation of an artery (vasculitis). Sometimes the cause is not known. What increases the risk? Certain factors may make you more likely to develop this condition. Some of these are things that you can change, such as:  Obesity.  Using products that contain nicotine or tobacco.  Taking oral birth control, especially if you also use tobacco.  Not being active.  Heavy alcohol use.  Drug use, especially cocaine and methamphetamine. Medical conditions that may increase your risk include:  High blood pressure (hypertension).  High cholesterol.  Diabetes.  Heart disease (coronary artery disease).  An irregular heartbeat, also called atrial fibrillation (AFib).  Sickle cell disease.  Sleep problems (sleep apnea).  Chronic inflammatory diseases, such as rheumatoid arthritis or lupus.  Blood clotting disorders (hypercoagulable state). Other risk factors include:  Being over the age of 15.  Being male.  Family history of stroke.  Previous history of blood clots, stroke, TIA, or heart attack.  Having a history of preeclampsia.  Migraine headache. What are the signs or symptoms? Symptoms of a TIA are the same as those of a stroke. The symptoms develop suddenly, and then go away quickly. They may include:  Weakness or numbness in your face, arm, or  leg, especially on one side of your body.  Trouble walking or moving your arms or legs.  Trouble speaking, understanding speech, or both (aphasia).  Vision changes, such as double vision, blurred vision, or loss of vision.  Dizziness.  Confusion.  Loss of balance or coordination.  Nausea and vomiting.  Severe headache. If possible, note what time your symptoms started. Tell your health care provider.   How is this diagnosed? This condition may be diagnosed based on:  Your symptoms and medical history.  A physical exam.  Imaging tests, usually a CT scan or MRI of the brain.  Blood tests. You may also have other tests, including:  Electrocardiogram (ECG).  Echocardiogram.  Carotid ultrasound.  A scan of blood circulation in the brain (CT angiogram or MR angiogram).  Continuous heart monitoring. How is this treated? The goal of treatment is to reduce the risk for a stroke. Stroke prevention therapies may include:  Changes to diet and lifestyle, such as being physically active and stopping smoking.  Medicines to thin the blood (antiplatelets or anticoagulants).  Blood pressure medicines.  Medicines to reduce cholesterol.  Treating other health conditions, such as diabetes or AFib. If testing shows a narrowing in the arteries to your brain, your health care provider may recommend a procedure, such as:  Carotid endarterectomy. This is done to remove the blockage from your artery.  Carotid angioplasty and stenting. This uses a tube (stent) to open or widen an artery in the neck. The stent helps keep the artery open by supporting the artery walls. Follow these instructions at home: Medicines  Take over-the-counter and prescription medicines only as told by your health care provider.  If you were told to take a medicine to thin your blood, such as aspirin or an anticoagulant, take it exactly as told by your health care provider. ? Taking too much blood-thinning  medicine can cause bleeding. Taking too little will not protect you against a stroke and other problems. Eating and drinking  Eat 5 or more servings of fruits and vegetables each day.  Follow guidelines from your health care provider about your diet. You may need to follow a certain diet to help manage risk factors for stroke. This may include: ? Eating a low-fat, low-salt diet. ? Choosing high-fiber foods. ? Limiting carbohydrates and sugar.  If you drink alcohol: ? Limit how much you have to:  0-1 drink a day for women who are not pregnant.  0-2 drinks a day for men. ? Know how much alcohol is in a drink. In the [place] one drink equals one 12 oz bottle of beer (370mL), one 5 oz glass of wine (131mL), or one 1 oz glass of hard liquor (5mL).   General instructions  Maintain a healthy weight.  Try to get at least 30 minutes of exercise on most days.  Get treatment if you have sleep apnea.  Do not use any products that contain nicotine or tobacco. These products include cigarettes, chewing tobacco, and vaping devices, such as e-cigarettes. If you need help quitting, ask your health care provider.  Do not use drugs.  Keep all follow-up visits. This is important. Where to find more information  American Stroke Association: www.stroke.org Get help right away if:  You have chest pain or an irregular heartbeat.  You have any symptoms of a stroke. "BE FAST" is an easy way to remember the main warning signs of a stroke. ? B - Balance. Signs are dizziness, sudden trouble walking, or loss of balance. ? E - Eyes. Signs are trouble seeing or a sudden change in vision. ? F - Face. Signs are sudden weakness or numbness of the face, or the face or eyelid drooping on one side. ? A - Arms. Signs are weakness or numbness in an arm. This happens suddenly and usually on one side of the body. ? S - Speech. Signs are sudden trouble speaking, slurred speech, or trouble understanding what people  say. ? T - Time. Time to call emergency services. Write down what time symptoms started.  You have other signs of a stroke, such as: ? A sudden, severe headache with no known cause. ? Nausea or vomiting. ? Seizure. These symptoms may represent a serious problem that is an emergency. Do not wait to see if the symptoms will go away. Get medical help right away. Call your local emergency services (911 in the [place] Do not drive yourself to the hospital. Summary  A transient ischemic attack (TIA) happens when an artery in the head or neck is blocked. The blockage clears before there is any permanent brain damage. A TIA is a medical emergency.  Symptoms of a TIA are the same as those of a stroke. The symptoms develop suddenly, and then go away quickly.  Having a TIA means that you are at higher risk for a stroke.  The goal of treatment is to reduce your risk for a stroke. Treatment may include medicines to thin the blood and changes to diet and lifestyle. This information is not intended to replace advice given to you by your health  care provider. Make sure you discuss any questions you have with your health care provider. Document Revised: 09/17/2019 Document Reviewed: 09/17/2019 Elsevier Patient Education  [place].    Ischemic Stroke  An ischemic stroke (cerebrovascular accident, or CVA) is the sudden death of brain tissue that occurs when an area of the brain does not get enough blood flow. This condition is a medical emergency that must be treated right away. An ischemic stroke can cause permanent loss of brain function. Losing brain function can cause problems with how different parts of the body work. What are the causes? This condition is caused by a decrease of blood flow to an area of the brain, which may be the result of:  A small blood clot (embolus) or a buildup of plaque in the blood vessels, called atherosclerosis, that blocks blood flow in the brain.  An abnormal  heart rhythm called atrial fibrillation, which sends a small blood clot to the brain.  A blocked or damaged artery in the head or neck.  Certain infections.  Inflammation of the arteries in the brain (vasculitis). Sometimes, the cause of ischemic stroke is not known. What increases the risk? The following factors may make you more likely to develop this condition: Factors that you can change  High blood pressure (hypertension) or certain other medical conditions, such as: ? Heart disease. ? Diabetes mellitus. ? High cholesterol. ? Obesity. ? Sleep apnea. ? Migraine headache.  Smoking cigarettes or using other tobacco products.  Physical inactivity.  Heavy alcohol use.  Use of illegal drugs, especially cocaine and methamphetamine.  Taking birth control pills, especially if you also use tobacco. Factors that you cannot change  Being older than age 38.  History of blood clots, stroke, or mini-stroke (transient ischemic attack, TIA).  History of high blood pressure when pregnant (preeclampsia), in women.  Family history of stroke.  Sickle cell disease.  Blood clotting disorders (hypercoagulable state). What are the signs or symptoms? Symptoms of this condition usually develop suddenly, or you may notice them after waking from sleep. These sudden symptoms may include:  Weakness or numbness of your face, arm, or leg, especially on one side of your body.  Loss of balance or coordination.  Slurred speech, or aphasia. Aphasia is trouble speaking or trouble understanding speech or both.  Vision changes in one or both eyes. You may have double vision, blurred vision, or loss of vision.  Dizziness or confusion.  Nausea and vomiting.  Severe headache with no known cause. If possible, write down the exact time that you last felt like your normal self and what time your symptoms started. Tell your health care provider. If symptoms come and go, they could be signs of a TIA  (transient ischemic attack). Get help right away, even if you feel better. How is this diagnosed? This condition may be diagnosed based on:  Your symptoms, your medical history, and a physical exam.  CT scan of the brain.  MRI.  Imaging tests that scan blood flow (circulation) in the brain. These may be CT angiogram, MRI angiogram, or cerebral angiogram. You may need to see a health care provider who specializes in stroke care. A stroke specialist can be seen in person or through communication using telephone or television technology (telemedicine). You may also have other tests, including:  Electrocardiogram (ECG).  Continuous heart monitoring.  Transthoracic echocardiogram (TTE).  Transesophageal echocardiogram (TEE).  Carotid ultrasound.  Blood tests.  Sleep study to check for sleep apnea. How  is this treated? Treatment for this condition depends on the duration, severity, and cause of your symptoms and on the area of the brain affected. It is very important to get treatment at the first sign of stroke symptoms. Some treatments work better if they are done within 3-6 hours of the start of stroke symptoms. These initial treatments may include:  Thrombolytic medicine that is injected to dissolve the blood clot.  Treatments given directly to the affected artery to remove or dissolve the blood clot.  Medicines to control blood pressure.  Anticoagulant or antiplatelet medicines to thin the blood. Other treatments may include:  Oxygen.  IV fluids.  Procedures to increase blood flow. Medicines and diet changes may be used to help manage risk factors for stroke, such as diabetes, high cholesterol, and high blood pressure. After a stroke, you may work with physical, speech, mental health, or occupational therapists to help you recover. Follow these instructions at home: Medicines  Take over-the-counter and prescription medicines only as told by your health care  provider.  If you were told to take a medicine to thin your blood, take your medicine exactly as told, at the same time every day. This includes aspirin or an anticoagulant. ? Taking too much blood-thinning medicine can cause bleeding. ? If you do not take enough blood-thinning medicine, you will not be protected enough against another stroke and other problems.  Understand the side effects of taking anticoagulant medicine. When taking this type of medicine, make sure you: ? Hold pressure over any cuts for longer than usual. ? Tell your dentist and other health care providers that you are taking anticoagulants before you have any procedures that may cause bleeding. ? Avoid activities that could cause injury or bruising. ? Wear a medical alert bracelet or carry a card that lists what medicines you take. Eating and drinking  Follow instructions from your health care provider about diet.  Eat healthy foods.  If your stroke affected your ability to swallow, you may need to take steps to avoid choking. These may include taking small bites when eating and eating foods that are soft or pureed. Safety  Follow instructions from your health care team about physical activity.  Use a walker or cane as told by your health care provider.  Take steps to create a safe home environment to lower your risk of falls. These steps may include: ? Having your home looked at by specialists. ? Installing grab bars in the bedroom and bathroom. ? Using safety equipment, such as raised toilets and a seat in the shower. General instructions  Do not use any products that contain nicotine or tobacco, such as cigarettes, e-cigarettes, and chewing tobacco. If you need help quitting, ask your health care provider.  If you drink alcohol: ? Limit how much you use to:  0-1 drink a day for women.  0-2 drinks a day for men. ? Be aware of how much alcohol is in your drink. In the [place] one drink equals one 12 oz bottle  of beer (355 mL), one 5 oz glass of wine (148 mL), or one 1 oz glass of hard liquor (44 mL).  If you need help to stop using drugs or alcohol, ask your health care provider about a referral to a program or specialist.  Maintain an active and healthy lifestyle. Get regular exercise as told.  Wear a medical bracelet as told by your health care provider.  Keep all follow-up visits as told by your health  care provider, including visits with all specialists on your health care team. This is important. How is this prevented? You can lower your risk of another stroke by managing high blood pressure, high cholesterol, diabetes, heart disease, sleep apnea, and obesity. Your risk can also be lowered by quitting smoking, limiting alcohol, and staying physically active. Your health care provider will continue to help you with ways to prevent short-term and long-term problems caused by stroke. Get help right away if: You have any symptoms of a stroke. "BE FAST" is an easy way to remember the main warning signs of a stroke:  B - Balance. Signs are dizziness, sudden trouble walking, or loss of balance.  E - Eyes. Signs are trouble seeing or a sudden change in vision.  F - Face. Signs are sudden weakness or numbness of the face, or the face or eyelid drooping on one side.  A - Arms. Signs are weakness or numbness in an arm. This happens suddenly and usually on one side of the body.  S - Speech. Signs are sudden trouble speaking, slurred speech, or trouble understanding what people say.  T - Time. Time to call emergency services. Write down what time symptoms started. You have other signs of a stroke, such as:  A sudden, severe headache with no known cause.  Nausea or vomiting.  Seizure. These symptoms may represent a serious problem that is an emergency. Do not wait to see if the symptoms will go away. Get medical help right away. Call your local emergency services (911 in the [place] Do not drive  yourself to the hospital.   Summary  An ischemic stroke (cerebrovascular accident, or CVA) is the sudden death of brain tissue that occurs when an area of the brain does not get enough blood flow.  Symptoms of this condition usually develop suddenly, or you may notice them after waking from sleep.  It is very important to get treatment at the first sign of stroke symptoms. Stroke is a medical emergency that must be treated right away. This information is not intended to replace advice given to you by your health care provider. Make sure you discuss any questions you have with your health care provider. Document Revised: 02/18/2019 Document Reviewed: 02/18/2019 Elsevier Patient Education  2021 [employer].

## 2020-05-26 NOTE — Progress Notes (Signed)
William Wheeler is a 70 y.o. male who presents to  [place] at [facility]  today, 05/26/20, seeking care for the following:  . NUMBNESS -  8 days ago episode of weakness, dropped phone while holding it. Lasted a few mins. 4 days ago episode of entire L side feeling weak./numb, also lasted maybe 5 mins. No pain. He looked up stroke symptoms at that time and wasn't having any speech difficulty, facial asymmetry, confusion so decided not to seek care since symptoms reaolved. He has CVA hx, last year see MRI 06/2019 o Secondary CVA prevention measures: Plavix, statin. LDL <70. BP ok today at 128/81 o Of note: hx post-herpetic neuralgia L arm, and hx known cervical spondylosis. MRI 10/2018 C-spine "Multilevel cervical spondylosis, as detailed above. Findings are most pronounced at the C5-6 level where there is moderate-to-severe left and moderate right foraminal stenosis with moderate canal stenosis." . FINGER - slammed in door, R 3rd finger is blackened.  . TOENAILS - concern for ingrowing,not bothering him to much just inquiring if anything needs to eb done about this.          ASSESSMENT & PLAN with other pertinent findings:  The primary encounter diagnosis was History of CVA (cerebrovascular accident) without residual deficits. 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   Secondary prevention measures optimized Referral to neurology - seems unlikely to be related to cervical spinal stenosis or postherpetic neuralgia, concern for TIA. Pt advised if symptoms recur please seek care ASAP. ER precautions reviewed.   Patient Instructions   Transient Ischemic Attack A transient ischemic attack (TIA) causes stroke-like symptoms that go away quickly. Having a TIA means that a person is at higher risk for  a stroke. A TIA happens when blood supply to the brain is blocked temporarily. A TIA is a medical emergency. What are the causes? This condition is caused by a temporary blockage in an artery in the head or neck. This means the brain does not get the blood supply it needs. There is no permanent brain damage with a TIA. A blockage can be caused by:  Fatty buildup in an artery in the head or neck (atherosclerosis).  A blood clot.  An artery tear (dissection).  Inflammation of an artery (vasculitis). Sometimes the cause is not known. What increases the risk? Certain factors may make you more likely to develop this condition. Some of these are things that you can change, such as:  Obesity.  Using products that contain nicotine or tobacco.  Taking oral birth control, especially if you also use tobacco.  Not being active.  Heavy alcohol use.  Drug use, especially cocaine and methamphetamine. Medical conditions that may increase your risk include:  High blood pressure (hypertension).  High cholesterol.  Diabetes.  Heart disease (coronary artery disease).  An irregular heartbeat, also called atrial fibrillation (AFib).  Sickle cell disease.  Sleep problems (sleep apnea).  Chronic inflammatory diseases, such as rheumatoid arthritis or lupus.  Blood clotting disorders (hypercoagulable state). Other risk factors include:  Being over the age of 75.  Being male.  Family history of stroke.  Previous history of blood clots, stroke, TIA, or heart attack.  Having a history of preeclampsia.  Migraine headache. What are the signs or symptoms? Symptoms of a TIA are the same as those of a stroke. The symptoms develop suddenly,  and then go away quickly. They may include:  Weakness or numbness in your face, arm, or leg, especially on one side of your body.  Trouble walking or moving your arms or legs.  Trouble speaking, understanding speech, or both (aphasia).  Vision  changes, such as double vision, blurred vision, or loss of vision.  Dizziness.  Confusion.  Loss of balance or coordination.  Nausea and vomiting.  Severe headache. If possible, note what time your symptoms started. Tell your health care provider.   How is this diagnosed? This condition may be diagnosed based on:  Your symptoms and medical history.  A physical exam.  Imaging tests, usually a CT scan or MRI of the brain.  Blood tests. You may also have other tests, including:  Electrocardiogram (ECG).  Echocardiogram.  Carotid ultrasound.  A scan of blood circulation in the brain (CT angiogram or MR angiogram).  Continuous heart monitoring. How is this treated? The goal of treatment is to reduce the risk for a stroke. Stroke prevention therapies may include:  Changes to diet and lifestyle, such as being physically active and stopping smoking.  Medicines to thin the blood (antiplatelets or anticoagulants).  Blood pressure medicines.  Medicines to reduce cholesterol.  Treating other health conditions, such as diabetes or AFib. If testing shows a narrowing in the arteries to your brain, your health care provider may recommend a procedure, such as:  Carotid endarterectomy. This is done to remove the blockage from your artery.  Carotid angioplasty and stenting. This uses a tube (stent) to open or widen an artery in the neck. The stent helps keep the artery open by supporting the artery walls. Follow these instructions at home: Medicines  Take over-the-counter and prescription medicines only as told by your health care provider.  If you were told to take a medicine to thin your blood, such as aspirin or an anticoagulant, take it exactly as told by your health care provider. ? Taking too much blood-thinning medicine can cause bleeding. Taking too little will not protect you against a stroke and other problems. Eating and drinking  Eat 5 or more servings of fruits  and vegetables each day.  Follow guidelines from your health care provider about your diet. You may need to follow a certain diet to help manage risk factors for stroke. This may include: ? Eating a low-fat, low-salt diet. ? Choosing high-fiber foods. ? Limiting carbohydrates and sugar.  If you drink alcohol: ? Limit how much you have to:  0-1 drink a day for women who are not pregnant.  0-2 drinks a day for men. ? Know how much alcohol is in a drink. In the [place] one drink equals one 12 oz bottle of beer (373mL), one 5 oz glass of wine (16mL), or one 1 oz glass of hard liquor (41mL).   General instructions  Maintain a healthy weight.  Try to get at least 30 minutes of exercise on most days.  Get treatment if you have sleep apnea.  Do not use any products that contain nicotine or tobacco. These products include cigarettes, chewing tobacco, and vaping devices, such as e-cigarettes. If you need help quitting, ask your health care provider.  Do not use drugs.  Keep all follow-up visits. This is important. Where to find more information  American Stroke Association: www.stroke.org Get help right away if:  You have chest pain or an irregular heartbeat.  You have any symptoms of a stroke. "BE FAST" is an easy way to remember  the main warning signs of a stroke. ? B - Balance. Signs are dizziness, sudden trouble walking, or loss of balance. ? E - Eyes. Signs are trouble seeing or a sudden change in vision. ? F - Face. Signs are sudden weakness or numbness of the face, or the face or eyelid drooping on one side. ? A - Arms. Signs are weakness or numbness in an arm. This happens suddenly and usually on one side of the body. ? S - Speech. Signs are sudden trouble speaking, slurred speech, or trouble understanding what people say. ? T - Time. Time to call emergency services. Write down what time symptoms started.  You have other signs of a stroke, such as: ? A sudden, severe headache  with no known cause. ? Nausea or vomiting. ? Seizure. These symptoms may represent a serious problem that is an emergency. Do not wait to see if the symptoms will go away. Get medical help right away. Call your local emergency services (911 in the [place] Do not drive yourself to the hospital. Summary  A transient ischemic attack (TIA) happens when an artery in the head or neck is blocked. The blockage clears before there is any permanent brain damage. A TIA is a medical emergency.  Symptoms of a TIA are the same as those of a stroke. The symptoms develop suddenly, and then go away quickly.  Having a TIA means that you are at higher risk for a stroke.  The goal of treatment is to reduce your risk for a stroke. Treatment may include medicines to thin the blood and changes to diet and lifestyle. This information is not intended to replace advice given to you by your health care provider. Make sure you discuss any questions you have with your health care provider. Document Revised: 09/17/2019 Document Reviewed: 09/17/2019 Elsevier Patient Education  [place].    Ischemic Stroke  An ischemic stroke (cerebrovascular accident, or CVA) is the sudden death of brain tissue that occurs when an area of the brain does not get enough blood flow. This condition is a medical emergency that must be treated right away. An ischemic stroke can cause permanent loss of brain function. Losing brain function can cause problems with how different parts of the body work. What are the causes? This condition is caused by a decrease of blood flow to an area of the brain, which may be the result of:  A small blood clot (embolus) or a buildup of plaque in the blood vessels, called atherosclerosis, that blocks blood flow in the brain.  An abnormal heart rhythm called atrial fibrillation, which sends a small blood clot to the brain.  A blocked or damaged artery in the head or neck.  Certain  infections.  Inflammation of the arteries in the brain (vasculitis). Sometimes, the cause of ischemic stroke is not known. What increases the risk? The following factors may make you more likely to develop this condition: Factors that you can change  High blood pressure (hypertension) or certain other medical conditions, such as: ? Heart disease. ? Diabetes mellitus. ? High cholesterol. ? Obesity. ? Sleep apnea. ? Migraine headache.  Smoking cigarettes or using other tobacco products.  Physical inactivity.  Heavy alcohol use.  Use of illegal drugs, especially cocaine and methamphetamine.  Taking birth control pills, especially if you also use tobacco. Factors that you cannot change  Being older than age 56.  History of blood clots, stroke, or mini-stroke (transient ischemic attack, TIA).  History  of high blood pressure when pregnant (preeclampsia), in women.  Family history of stroke.  Sickle cell disease.  Blood clotting disorders (hypercoagulable state). What are the signs or symptoms? Symptoms of this condition usually develop suddenly, or you may notice them after waking from sleep. These sudden symptoms may include:  Weakness or numbness of your face, arm, or leg, especially on one side of your body.  Loss of balance or coordination.  Slurred speech, or aphasia. Aphasia is trouble speaking or trouble understanding speech or both.  Vision changes in one or both eyes. You may have double vision, blurred vision, or loss of vision.  Dizziness or confusion.  Nausea and vomiting.  Severe headache with no known cause. If possible, write down the exact time that you last felt like your normal self and what time your symptoms started. Tell your health care provider. If symptoms come and go, they could be signs of a TIA (transient ischemic attack). Get help right away, even if you feel better. How is this diagnosed? This condition may be diagnosed based on:  Your  symptoms, your medical history, and a physical exam.  CT scan of the brain.  MRI.  Imaging tests that scan blood flow (circulation) in the brain. These may be CT angiogram, MRI angiogram, or cerebral angiogram. You may need to see a health care provider who specializes in stroke care. A stroke specialist can be seen in person or through communication using telephone or television technology (telemedicine). You may also have other tests, including:  Electrocardiogram (ECG).  Continuous heart monitoring.  Transthoracic echocardiogram (TTE).  Transesophageal echocardiogram (TEE).  Carotid ultrasound.  Blood tests.  Sleep study to check for sleep apnea. How is this treated? Treatment for this condition depends on the duration, severity, and cause of your symptoms and on the area of the brain affected. It is very important to get treatment at the first sign of stroke symptoms. Some treatments work better if they are done within 3-6 hours of the start of stroke symptoms. These initial treatments may include:  Thrombolytic medicine that is injected to dissolve the blood clot.  Treatments given directly to the affected artery to remove or dissolve the blood clot.  Medicines to control blood pressure.  Anticoagulant or antiplatelet medicines to thin the blood. Other treatments may include:  Oxygen.  IV fluids.  Procedures to increase blood flow. Medicines and diet changes may be used to help manage risk factors for stroke, such as diabetes, high cholesterol, and high blood pressure. After a stroke, you may work with physical, speech, mental health, or occupational therapists to help you recover. Follow these instructions at home: Medicines  Take over-the-counter and prescription medicines only as told by your health care provider.  If you were told to take a medicine to thin your blood, take your medicine exactly as told, at the same time every day. This includes aspirin or an  anticoagulant. ? Taking too much blood-thinning medicine can cause bleeding. ? If you do not take enough blood-thinning medicine, you will not be protected enough against another stroke and other problems.  Understand the side effects of taking anticoagulant medicine. When taking this type of medicine, make sure you: ? Hold pressure over any cuts for longer than usual. ? Tell your dentist and other health care providers that you are taking anticoagulants before you have any procedures that may cause bleeding. ? Avoid activities that could cause injury or bruising. ? Wear a medical alert bracelet or carry  a card that lists what medicines you take. Eating and drinking  Follow instructions from your health care provider about diet.  Eat healthy foods.  If your stroke affected your ability to swallow, you may need to take steps to avoid choking. These may include taking small bites when eating and eating foods that are soft or pureed. Safety  Follow instructions from your health care team about physical activity.  Use a walker or cane as told by your health care provider.  Take steps to create a safe home environment to lower your risk of falls. These steps may include: ? Having your home looked at by specialists. ? Installing grab bars in the bedroom and bathroom. ? Using safety equipment, such as raised toilets and a seat in the shower. General instructions  Do not use any products that contain nicotine or tobacco, such as cigarettes, e-cigarettes, and chewing tobacco. If you need help quitting, ask your health care provider.  If you drink alcohol: ? Limit how much you use to:  0-1 drink a day for women.  0-2 drinks a day for men. ? Be aware of how much alcohol is in your drink. In the [place] one drink equals one 12 oz bottle of beer (355 mL), one 5 oz glass of wine (148 mL), or one 1 oz glass of hard liquor (44 mL).  If you need help to stop using drugs or alcohol, ask your  health care provider about a referral to a program or specialist.  Maintain an active and healthy lifestyle. Get regular exercise as told.  Wear a medical bracelet as told by your health care provider.  Keep all follow-up visits as told by your health care provider, including visits with all specialists on your health care team. This is important. How is this prevented? You can lower your risk of another stroke by managing high blood pressure, high cholesterol, diabetes, heart disease, sleep apnea, and obesity. Your risk can also be lowered by quitting smoking, limiting alcohol, and staying physically active. Your health care provider will continue to help you with ways to prevent short-term and long-term problems caused by stroke. Get help right away if: You have any symptoms of a stroke. "BE FAST" is an easy way to remember the main warning signs of a stroke:  B - Balance. Signs are dizziness, sudden trouble walking, or loss of balance.  E - Eyes. Signs are trouble seeing or a sudden change in vision.  F - Face. Signs are sudden weakness or numbness of the face, or the face or eyelid drooping on one side.  A - Arms. Signs are weakness or numbness in an arm. This happens suddenly and usually on one side of the body.  S - Speech. Signs are sudden trouble speaking, slurred speech, or trouble understanding what people say.  T - Time. Time to call emergency services. Write down what time symptoms started. You have other signs of a stroke, such as:  A sudden, severe headache with no known cause.  Nausea or vomiting.  Seizure. These symptoms may represent a serious problem that is an emergency. Do not wait to see if the symptoms will go away. Get medical help right away. Call your local emergency services (911 in the [place] Do not drive yourself to the hospital.   Summary  An ischemic stroke (cerebrovascular accident, or CVA) is the sudden death of brain tissue that occurs when an area  of the brain does not get enough blood flow.  Symptoms of this condition usually develop suddenly, or you may notice them after waking from sleep.  It is very important to get treatment at the first sign of stroke symptoms. Stroke is a medical emergency that must be treated right away. This information is not intended to replace advice given to you by your health care provider. Make sure you discuss any questions you have with your health care provider. Document Revised: 02/18/2019 Document Reviewed: 02/18/2019 Elsevier Patient Education  2021 [place].    Orders Placed This Encounter  Procedures  . Ambulatory referral to Neurology    Meds ordered this encounter  Medications  . atorvastatin (LIPITOR) 40 MG tablet    Sig: Take 1 tablet (40 mg total) by mouth daily.    Dispense:  90 tablet    Refill:  3  . clopidogrel (PLAVIX) 75 MG tablet    Sig: Take 1 tablet (75 mg total) by mouth daily.    Dispense:  90 tablet    Refill:  3  . losartan-hydrochlorothiazide (HYZAAR) 50-12.5 MG tablet    Sig: Take 1 tablet by mouth daily.    Dispense:  90 tablet    Refill:  3     See below for relevant physical exam findings  See below for recent lab and imaging results reviewed  Medications, allergies, PMH, PSH, SocH, FamH reviewed below    Follow-up instructions: Return in about 4 months (around 09/25/2020) for [place] .                                        Exam:  BP 128/81 (BP Location: Left Arm, Patient Position: Sitting, Cuff Size: Normal)   Pulse 60   Resp 20   Ht 6\' 2"  (1.88 m)   Wt 215 lb (97.5 kg)   SpO2 98%   BMI 27.60 kg/m   Constitutional: VS see above. General Appearance: alert, well-developed, well-nourished, NAD  Neck: No masses, trachea midline.   Respiratory: Normal respiratory effort. no wheeze, no rhonchi, no rales  Cardiovascular: S1/S2 normal, no murmur, no rub/gallop auscultated. RRR.   Musculoskeletal: Gait  normal. Symmetric and independent movement of all extremities  Abdominal: non-tender, non-distended, no appreciable organomegaly, neg Murphy's, BS [id number]  Neurological: Normal balance/coordination. No tremor. Strength 5/5 all extremities, CN grossly intact, sensation intact and symmetrical in arms/legs.   Skin: warm, dry, intact.   Psychiatric: Normal judgment/insight. Normal mood and affect. Oriented x3.   Current Meds  Medication Sig  . acetaminophen (TYLENOL) 325 MG tablet Take 650 mg by mouth every 6 (six) hours as needed.  Marland Kitchen ibuprofen (ADVIL) 200 MG tablet Take 200 mg by mouth every 6 (six) hours as needed.  . [DISCONTINUED] atorvastatin (LIPITOR) 40 MG tablet Take 1 tablet (40 mg total) by mouth daily.  . [DISCONTINUED] clopidogrel (PLAVIX) 75 MG tablet Take 1 tablet (75 mg total) by mouth daily.  . [DISCONTINUED] losartan-hydrochlorothiazide (HYZAAR) 50-12.5 MG tablet Take 1 tablet by mouth daily. appt for refills    No Known Allergies  Patient Active Problem List   Diagnosis Date Noted  . Inguinal hernia 12/05/2019  . History of CVA (cerebrovascular accident) without residual deficits 08/13/2019  . 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 Cervical stenosis of spinal canal 11/16/2018  . 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 Herpes zoster without complication 71/08/2692  .  Essential hypertension 11/01/2018  . Alcoholism in remission ([place]) 11/01/2018  . 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 History of colon polyps 11/01/2018  . History of colonic diverticulitis 11/01/2018  . Paresthesia of left arm 11/01/2018  . Nocturia 11/01/2018  . Arm numbness left 11/01/2018    Family History  Problem Relation Age of Onset  . Leukemia Mother   . Non-Hodgkin's lymphoma Mother     Social History   Tobacco Use   Smoking Status Former Smoker  . Packs/day: 2.50  . Years: 29.00  . Pack years: 72.50  . Quit date: 2001  . Years since quitting: 21.2  Smokeless Tobacco Never Used    Past Surgical History:  Procedure Laterality Date  . broken bone repair     Nose    Immunization History  Administered Date(s) Administered  . Fluad Quad(high Dose 65+) 11/15/2018  . Influenza-Unspecified 01/06/2020  . PFIZER(Purple Top)SARS-COV-2 Vaccination 04/27/2019, 05/21/2019, 12/07/2019  . Zoster Recombinat (Shingrix) 08/17/2019    No results found for this or any previous visit (from the past 2160 hour(s)).  No results found.     All questions at time of visit were answered - patient instructed to contact office with any additional concerns or updates. ER/RTC precautions were reviewed with the patient as applicable.   Please note: manual typing as well as voice recognition software may have been used to produce this document - typos may escape review. Please contact Dr. Sheppard Coil for any needed clarifications.

## 2020-06-01 DIAGNOSIS — Z1211 Encounter for screening for malignant neoplasm of colon: Secondary | ICD-10-CM | POA: Diagnosis not present

## 2020-06-02 ENCOUNTER — Other Ambulatory Visit: Payer: Self-pay

## 2020-06-02 ENCOUNTER — Encounter: Payer: Self-pay | Admitting: Neurology

## 2020-06-02 ENCOUNTER — Telehealth: Payer: Self-pay | Admitting: Neurology

## 2020-06-02 ENCOUNTER — Ambulatory Visit (INDEPENDENT_AMBULATORY_CARE_PROVIDER_SITE_OTHER): Payer: Medicare Other | Admitting: Neurology

## 2020-06-02 VITALS — BP 121/78 | HR 58 | Ht 74.0 in | Wt 216.5 lb

## 2020-06-02 DIAGNOSIS — G459 Transient cerebral ischemic attack, unspecified: Secondary | ICD-10-CM

## 2020-06-02 LAB — COLOGUARD: Cologuard: NEGATIVE

## 2020-06-02 NOTE — Progress Notes (Signed)
Chief Complaint  Patient presents with  . New Patient (Initial Visit)    Two weeks ago, he had an episode of left hand numbness and weakness where he dropped his phone twice. The following week, he experienced a sudden onset of full left sided numbness that lasted around 10 minutes. Hx of CVA. Currently on Plavix 75mg  daily. He will occasionally have blurred vision. Says he had  a recent eye exam by ophthalmology. He does have a "slight retinal detachment" but the MD was unconcerned about it.       ASSESSMENT AND PLAN  William Wheeler is a 70 y.o. male   TIA  Two incident, March 15, May 23, 2020, each episode lasted less than 10 minutes, eventually localized to the right thalamus/posterior limb of internal capsule, most likely small vessel disease  Vascular risk factor of hypertension, hyperlipidemia, previous heavy smoker, alcohol abuse, moderate carotid artery stenosis  Repeat ultrasound of carotid artery  MRI of the brain without contrast  30 days cardiac monitoring  Continue Plavix daily  Continue follow-up with primary care to stratify vascular risk factor  DIAGNOSTIC DATA (LABS, IMAGING, TESTING) - I reviewed patient records, labs, notes, testing and imaging myself where available.  MRI brain without contrast June 23, 2020, 8 mm cortical signal abnormality at the right posterior frontal lobe favoring a subacute infarction  MRI cervical spine without contrast November 05, 2018, multilevel cervical spondylosis, most severe at C5-6, with mild to moderate canal stenosis, moderate to severe left more than right foraminal stenosis, no cord signal abnormality  Ultrasound of carotid artery on Jul 09, 2019: Moderate heterogeneous and calcified plaque, with no hemodynamic significant stenosis of bilateral internal carotid artery, antegrade flow of bilateral vertebral artery   Echocardiogram in May 2021, normal ejection fraction 55 to 60%  Laboratory evaluations since 2021: LDL 68,  normal CMP, TSH  HISTORICAL  William Wheeler is a 70 year old male, seen in request by his primary care physician Dr. Emeterio Reeve, for evaluation of sudden onset left-sided numbness, initial evaluation was on June 02, 2020  I reviewed and summarized the referring note.  Past medical history Hyperlipidemia Hypertension Stroke, April 2021,  Confused all day. History of alcohol abuse, quit in 2019. Formal smoker, quit in 2001, was smoking 2-3ppd  He has multiple vascular risk factor, previously heavy smoker up to 2 pack a day, quit in 2000 mg, heavy alcoholism, quit around 2019, also have hypertension, hyperlipidemia, in 2021, he presented with frequent muscle cramping, full evaluation, he received MRI of the brain with without contrast, incidental findings of positive right posterior frontal DWI lesions, considered to be subacute stroke, he was started on Plavix 75 mg daily  On May 19, 2020, while holding the phone with his left hand, he had sudden onset of left arm numbness, weakness, phone drop out of his hand couple times, episode lasted about 2 minutes,  Another episode on May 23, 2020, he had sudden onset of numbness involving left face, left arm, leg, no weakness, episode lasted 10 minutes,  He denies palpitation, chest pain, reported normal vital signs during the spells    REVIEW OF SYSTEMS: Full 14 system review of systems performed and notable only for as above All other review of systems were negative.  PHYSICAL EXAM   Vitals:   06/02/20 0832  BP: 121/78  Pulse: (!) 58  Weight: 216 lb 8 oz (98.2 kg)  Height: 6\' 2"  (1.88 m)   Not recorded     Body mass  index is 27.8 kg/m.  PHYSICAL EXAMNIATION:  Gen: NAD, conversant, well nourised, well groomed                     Cardiovascular: Regular rate rhythm, no peripheral edema, warm, nontender. Eyes: Conjunctivae clear without exudates or hemorrhage Neck: Supple, no carotid bruits. Pulmonary: Clear to  auscultation bilaterally   NEUROLOGICAL EXAM:  MENTAL STATUS: Speech:    Speech is normal; fluent and spontaneous with normal comprehension.  Cognition:     Orientation to time, place and person     Normal recent and remote memory     Normal Attention span and concentration     Normal Language, naming, repeating,spontaneous speech     Fund of knowledge   CRANIAL NERVES: CN II: Visual fields are full to confrontation. Pupils are round equal and briskly reactive to light. CN III, IV, VI: extraocular movement are normal. No ptosis. CN V: Facial sensation is intact to light touch CN VII: Face is symmetric with normal eye closure  CN VIII: Hearing is normal to causal conversation. CN IX, X: Phonation is normal. CN XI: Head turning and shoulder shrug are intact  MOTOR: There is no pronator drift of out-stretched arms. Muscle bulk and tone are normal. Muscle strength is normal.  REFLEXES: Reflexes are 2+ and symmetric at the biceps, triceps, knees, and ankles. Plantar responses are flexor.  SENSORY: Intact to light touch, pinprick and vibratory sensation are intact in fingers and toes.  COORDINATION: There is no trunk or limb dysmetria noted.  GAIT/STANCE: Posture is normal. Gait is steady with normal steps, base, arm swing, and turning. Heel and toe walking are normal. Tandem gait is normal.  Romberg is absent.  ALLERGIES: No Known Allergies  HOME MEDICATIONS: Current Outpatient Medications  Medication Sig Dispense Refill  . acetaminophen (TYLENOL) 325 MG tablet Take 650 mg by mouth every 6 (six) hours as needed.    Marland Kitchen atorvastatin (LIPITOR) 40 MG tablet Take 1 tablet (40 mg total) by mouth daily. 90 tablet 3  . clopidogrel (PLAVIX) 75 MG tablet Take 1 tablet (75 mg total) by mouth daily. 90 tablet 3  . ibuprofen (ADVIL) 200 MG tablet Take 200 mg by mouth every 6 (six) hours as needed.    Marland Kitchen losartan-hydrochlorothiazide (HYZAAR) 50-12.5 MG tablet Take 1 tablet by mouth daily.  90 tablet 3   No current facility-administered medications for this visit.    PAST MEDICAL HISTORY: Past Medical History:  Diagnosis Date  . Alcohol abuse   . Alcohol addiction ([place])   . Colon polyps   . Diverticulitis   . Hypercholesteremia   . Hypertension   . Stroke [facility])     PAST SURGICAL HISTORY: Past Surgical History:  Procedure Laterality Date  . broken bone repair     Nose  . HERNIA REPAIR      FAMILY HISTORY: Family History  Problem Relation Age of Onset  . Leukemia Mother   . Non-Hodgkin's lymphoma Mother   . Other Father        unsure of medical history    SOCIAL HISTORY: Social History   Socioeconomic History  . Marital status: Married    Spouse name: Not on file  . Number of children: 2  . Years of education: some college  . Highest education level: Not on file  Occupational History  . Occupation: Engineer, site  Tobacco Use  . Smoking status: Former Smoker    Packs/day: 2.50    Years:  29.00    Pack years: 72.50    Quit date: 2001    Years since quitting: 21.2  . Smokeless tobacco: Never Used  Vaping Use  . Vaping Use: Never used  Substance and Sexual Activity  . Alcohol use: Not Currently  . Drug use: Not Currently    Types: Marijuana  . Sexual activity: Not Currently    Partners: Female  Other Topics Concern  . Not on file  Social History Narrative   Lives at home with his wife.   Right-handed.   3-4 cups caffeine per day.   Two stepchildren.    Social Determinants of Health   Financial Resource Strain: Not on file  Food Insecurity: Not on file  Transportation Needs: Not on file  Physical Activity: Not on file  Stress: Not on file  Social Connections: Not on file  Intimate Partner Violence: Not on file      Marcial Pacas, M.D. Ph.D.  [facility] [address], [place] [employer], [place] [address] Ph: [phone] Fax: [phone]  CC:  Emeterio Reeve, [facility] [address] [place],   [address]  Emeterio Reeve, DO

## 2020-06-02 NOTE — Telephone Encounter (Signed)
Scheduled at [facility] for 06/06/20.

## 2020-06-02 NOTE — Telephone Encounter (Signed)
Medicare/mutual of [place] no auth. I message Bonnita Nasuti at [facility] about scheduling the patient there because that is where he has his other images at before.

## 2020-06-02 NOTE — Telephone Encounter (Signed)
30 days cardiac monitoring

## 2020-06-06 ENCOUNTER — Ambulatory Visit (INDEPENDENT_AMBULATORY_CARE_PROVIDER_SITE_OTHER): Payer: Medicare Other

## 2020-06-06 ENCOUNTER — Other Ambulatory Visit: Payer: Self-pay

## 2020-06-06 DIAGNOSIS — I639 Cerebral infarction, unspecified: Secondary | ICD-10-CM | POA: Diagnosis not present

## 2020-06-06 DIAGNOSIS — G459 Transient cerebral ischemic attack, unspecified: Secondary | ICD-10-CM | POA: Diagnosis not present

## 2020-06-06 DIAGNOSIS — R200 Anesthesia of skin: Secondary | ICD-10-CM | POA: Diagnosis not present

## 2020-06-08 ENCOUNTER — Telehealth: Payer: Self-pay | Admitting: *Deleted

## 2020-06-08 ENCOUNTER — Telehealth: Payer: Self-pay | Admitting: Neurology

## 2020-06-08 NOTE — Telephone Encounter (Signed)
  IMPRESSION: 1. No acute intracranial abnormality or significant interval change. 2. Remote focal right frontal lobe cortical infarct acute on the study from 1 year ago. Acute infarct.  Please call patient, MRI of the brain showed no acute abnormality, evidence of remote right frontal stroke, that was acute back in April 2021

## 2020-06-08 NOTE — Telephone Encounter (Signed)
-----   Message from Marcial Pacas, MD sent at 06/08/2020  1:59 PM EDT ----- Please call pt for normal MRI brain.

## 2020-06-08 NOTE — Telephone Encounter (Signed)
I spoke to patient and provided him with the MRI brain results. He verbalized understanding.

## 2020-06-08 NOTE — Telephone Encounter (Signed)
See other telephone note in Epic.

## 2020-06-09 NOTE — Telephone Encounter (Signed)
Monitor was mailed to patient. William Wheeler at [facility]

## 2020-06-18 ENCOUNTER — Ambulatory Visit (INDEPENDENT_AMBULATORY_CARE_PROVIDER_SITE_OTHER): Payer: Medicare Other

## 2020-06-18 DIAGNOSIS — I4891 Unspecified atrial fibrillation: Secondary | ICD-10-CM | POA: Diagnosis not present

## 2020-06-18 DIAGNOSIS — G459 Transient cerebral ischemic attack, unspecified: Secondary | ICD-10-CM | POA: Diagnosis not present

## 2020-07-21 ENCOUNTER — Other Ambulatory Visit: Payer: Self-pay | Admitting: *Deleted

## 2020-07-21 DIAGNOSIS — G459 Transient cerebral ischemic attack, unspecified: Secondary | ICD-10-CM

## 2020-07-21 DIAGNOSIS — I4891 Unspecified atrial fibrillation: Secondary | ICD-10-CM

## 2020-09-03 ENCOUNTER — Ambulatory Visit: Payer: Medicare Other | Admitting: Neurology

## 2020-09-30 DIAGNOSIS — Z23 Encounter for immunization: Secondary | ICD-10-CM | POA: Diagnosis not present

## 2020-10-03 IMAGING — MR MR HEAD WO/W CM
14 series · 46 of 48 positions shown · IV contrast (gadavist)
Comparison: None.

CLINICAL DATA: Neuro deficits, subacute. Left arm numbness for 2
months. Confusion. Diffuse muscle cramping.

EXAM:
MRI HEAD WITHOUT AND WITH CONTRAST
TECHNIQUE: Multiplanar, multiecho pulse sequences of the brain and surrounding
structures were obtained without and with intravenous contrast.
CONTRAST:  10mL GADAVIST GADOBUTROL 1 MMOL/ML IV SOLN

[Series 4: DWI · axial · 3.0mm · 1.20mm/px · z∈[-67,+92]mm · 4 of 55 slices shown (1 of 4)]
[im 1/55]
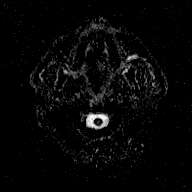
[im 19/55]
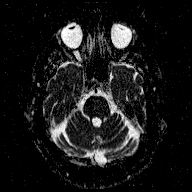
[im 37/55]
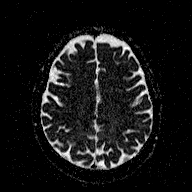
[im 55/55]
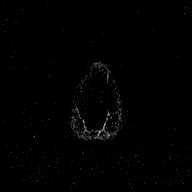

[Series 6: DWI · coronal · 3.0mm · 1.15mm/px · 4 of 50 slices shown (2 of 4)]
[im 1/50]
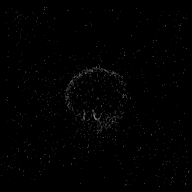
[im 17/50]
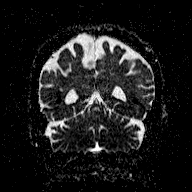
[im 33/50]
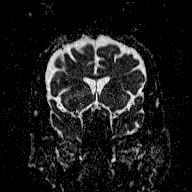
[im 50/50]
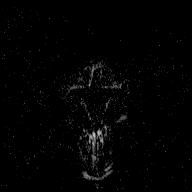

[Series 7: T1 · sagittal · 5.0mm · 0.47mm/px · 1 of 23 slices shown (1 of 2)]
[im 1/23]
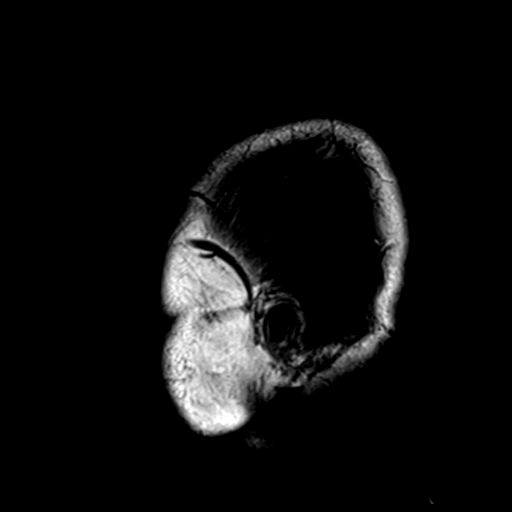

[Series 8: T2 · axial · 5.0mm · 0.72mm/px · 1 of 24 slices shown (1 of 2)]
[im 1/24]
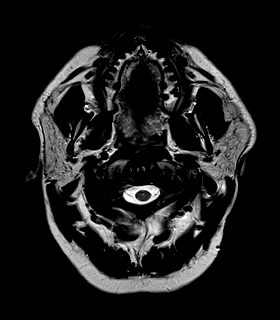

[Series 9: FLAIR · axial · 3.0mm · 0.45mm/px · z∈[-66,+93]mm · 3 of 55 slices shown (1 of 2)]
[im 1/55]
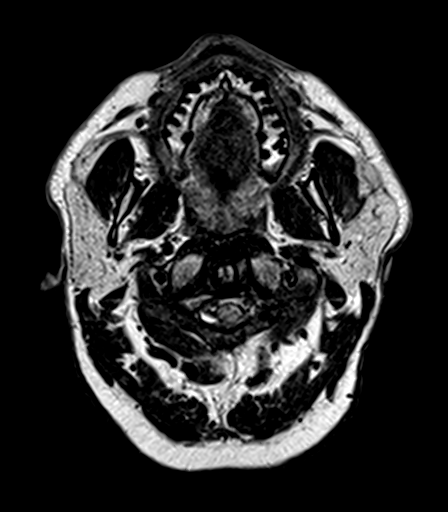
[im 28/55]
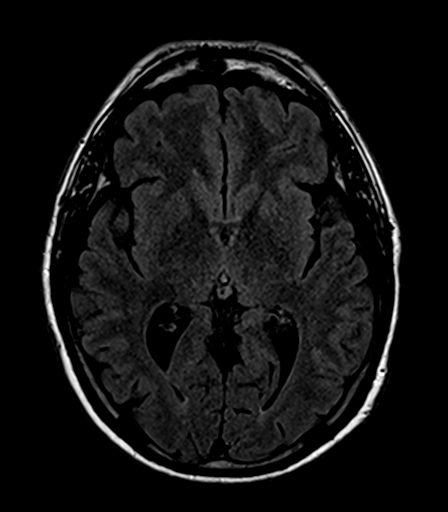
[im 55/55]
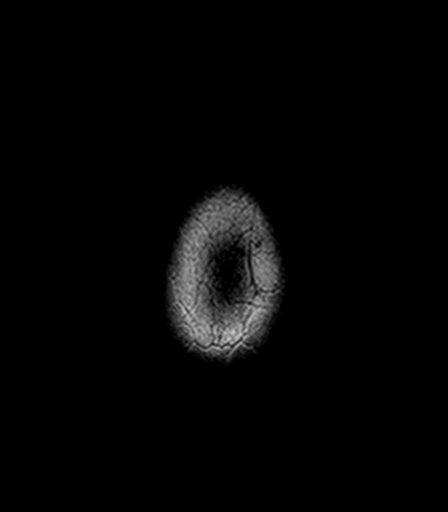

[Series 10: T2 · axial · 5.0mm · 0.72mm/px · 1 of 24 slices shown (2 of 2)]
[im 1/24]
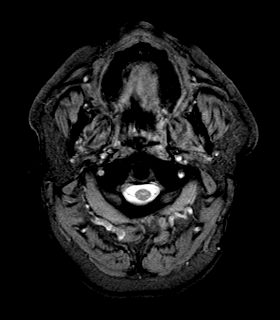

[Series 11: T1 · axial · 1.0mm · 1.00mm/px · z∈[-62,+94]mm · 8 of 160 slices shown (2 of 2)]
[im 1/160]
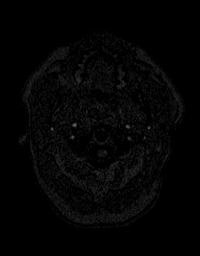
[im 18/160]
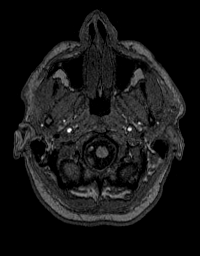
[im 54/160]
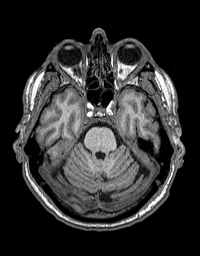
[im 71/160]
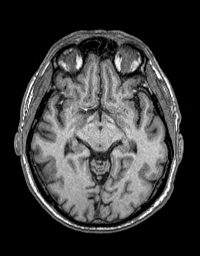
[im 89/160]
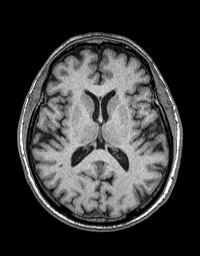
[im 107/160]
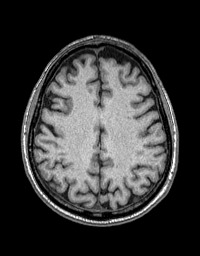
[im 142/160]
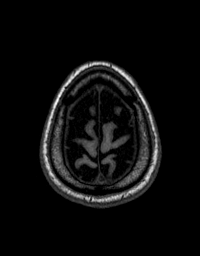
[im 160/160]
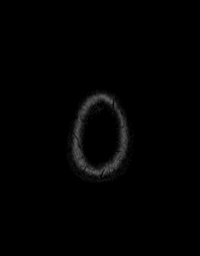

[Series 12: FLAIR · sagittal · 5.0mm · 0.45mm/px · 1 of 23 slices shown (2 of 2)]
[im 1/23]
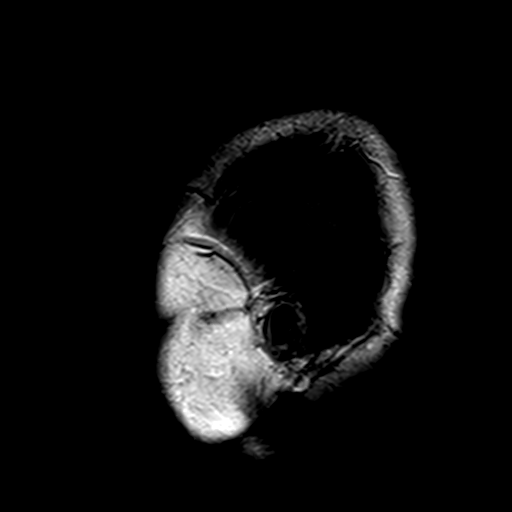

[Series 13: T2 post-contrast · coronal · 3.0mm · 0.43mm/px · 3 of 50 slices shown]
[im 1/50]
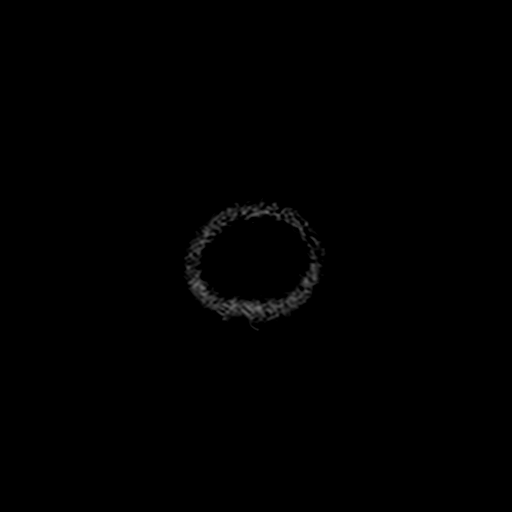
[im 25/50]
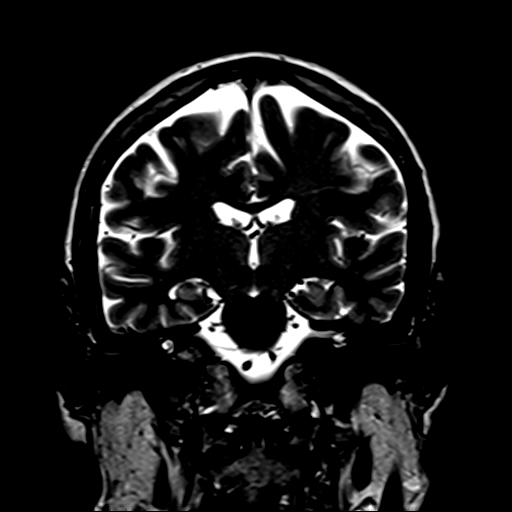
[im 50/50]
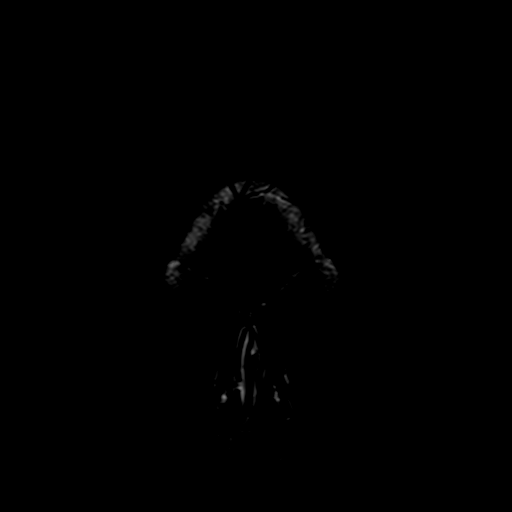

[Series 14: T1 post-contrast · axial · 1.0mm · 1.00mm/px · z∈[-62,+94]mm · 10 of 160 slices shown (1 of 3)]
[im 1/160]
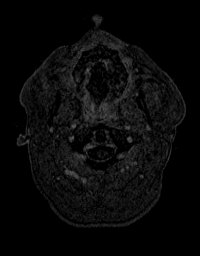
[im 18/160]
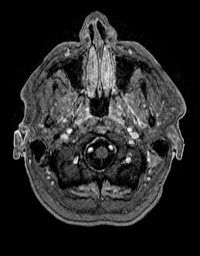
[im 36/160]
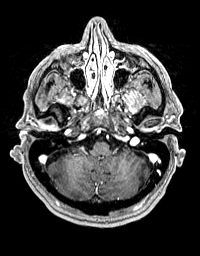
[im 54/160]
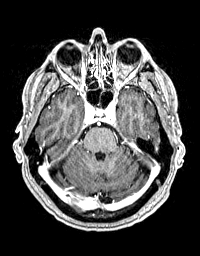
[im 71/160]
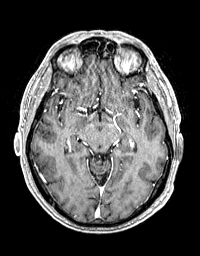
[im 89/160]
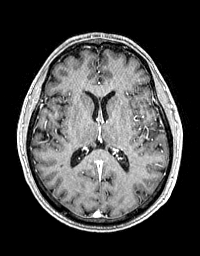
[im 107/160]
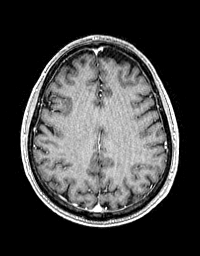
[im 124/160]
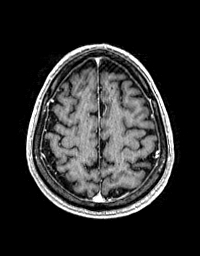
[im 142/160]
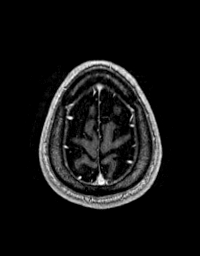
[im 160/160]
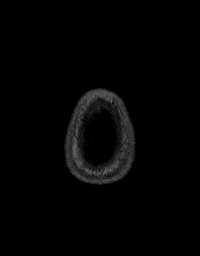

[Series 15: T1 post-contrast · coronal · 3.0mm · 0.43mm/px · 3 of 50 slices shown (2 of 3)]
[im 1/50]
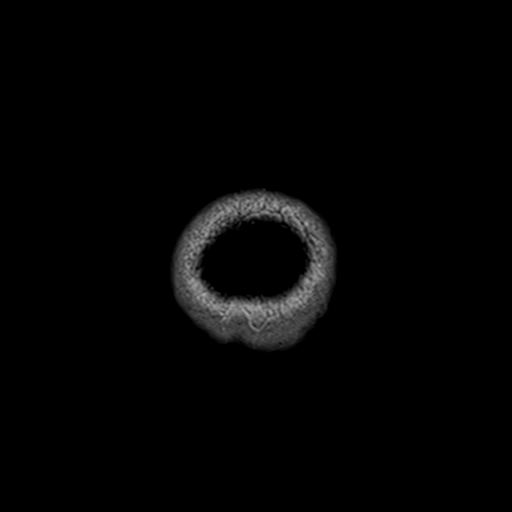
[im 25/50]
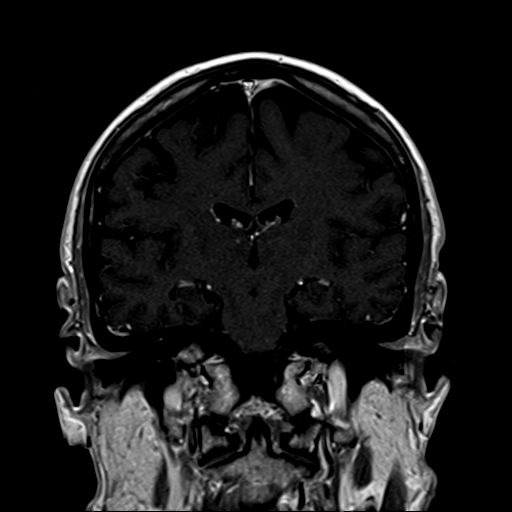
[im 50/50]
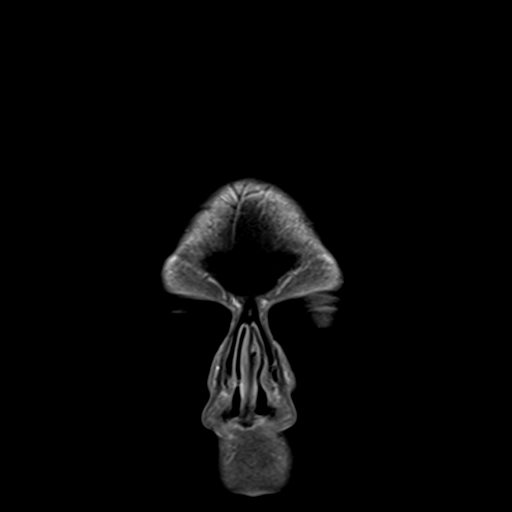

[Series 16: T1 post-contrast · sagittal · 5.0mm · 0.45mm/px · 1 of 23 slices shown (3 of 3)]
[im 1/23]
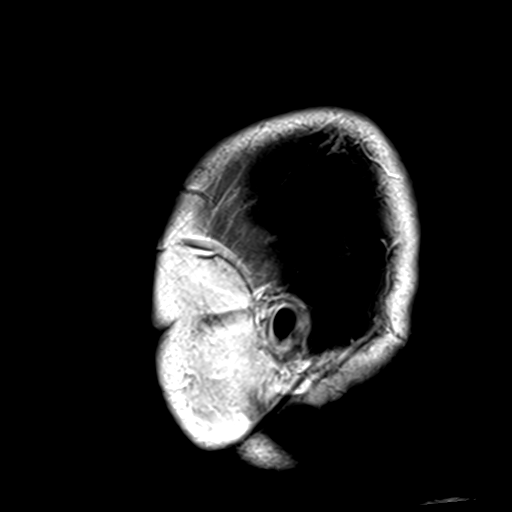

[Series 100: DWI · axial · 3.0mm · 1.20mm/px · z∈[-67,+92]mm · 3 of 55 slices shown (3 of 4)]
[im 1/55]
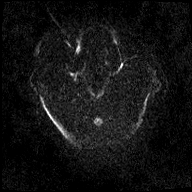
[im 28/55]
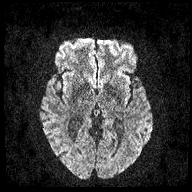
[im 55/55]
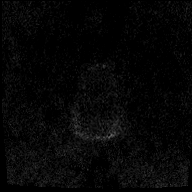

[Series 101: DWI · coronal · 3.0mm · 1.15mm/px · 3 of 50 slices shown (4 of 4)]
[im 1/50]
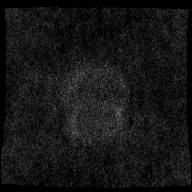
[im 25/50]
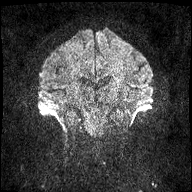
[im 50/50]
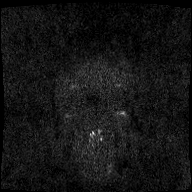

[46 of 48 positions shown; findings below may reference images not displayed]

FINDINGS: Brain: There is an 8 mm focus of hyperintense trace diffusion
weighted signal, normal ADC, and mild T2 FLAIR hyperintensity
involving cortex in the posterior right frontal lobe. There is no
abnormal enhancement. Elsewhere, the brain is normal in signal, and
there is no restricted diffusion to indicate an acute infarct. There
is no evidence of intracranial hemorrhage, mass, midline shift, or
extra-axial fluid collection. The ventricles and sulci are normal.

Vascular: Major intracranial vascular flow voids are preserved.

Skull and upper cervical spine: Unremarkable bone marrow signal.

Sinuses/Orbits: Unremarkable orbits. Minimal mucosal thickening in
the paranasal sinuses. Clear mastoid air cells.

Other: None.
IMPRESSION: 1. 8 mm focus of cortical signal abnormality in the posterior right
frontal lobe favoring a subacute infarct with this history.
2. Otherwise unremarkable appearance of the brain.

## 2020-10-09 ENCOUNTER — Ambulatory Visit (INDEPENDENT_AMBULATORY_CARE_PROVIDER_SITE_OTHER): Payer: Medicare Other | Admitting: Osteopathic Medicine

## 2020-10-09 ENCOUNTER — Telehealth: Payer: Self-pay | Admitting: Osteopathic Medicine

## 2020-10-09 DIAGNOSIS — Z20822 Contact with and (suspected) exposure to covid-19: Secondary | ICD-10-CM

## 2020-10-09 NOTE — Progress Notes (Signed)
COVID exposure, called to cancel, will not charge for visit!

## 2020-10-09 NOTE — Telephone Encounter (Signed)
Patient called at 8:03am and left a voicemail that he was exposed to covid. Wanted to cancel appointment and will call back to reschedule.

## 2020-10-09 NOTE — Telephone Encounter (Signed)
Thanks, will enter "no charge" for visit!

## 2021-02-04 ENCOUNTER — Encounter: Payer: Self-pay | Admitting: Family Medicine

## 2021-02-04 ENCOUNTER — Other Ambulatory Visit: Payer: Self-pay

## 2021-02-04 ENCOUNTER — Ambulatory Visit (INDEPENDENT_AMBULATORY_CARE_PROVIDER_SITE_OTHER): Payer: Medicare Other | Admitting: Family Medicine

## 2021-02-04 VITALS — BP 133/78 | HR 55 | Temp 97.8°F | Wt 223.0 lb

## 2021-02-04 DIAGNOSIS — I639 Cerebral infarction, unspecified: Secondary | ICD-10-CM

## 2021-02-04 DIAGNOSIS — M545 Low back pain, unspecified: Secondary | ICD-10-CM | POA: Diagnosis not present

## 2021-02-04 MED ORDER — PREDNISONE 20 MG PO TABS: 40.0000 mg | ORAL_TABLET | Freq: Every day | ORAL | 0 refills | Status: AC

## 2021-02-04 MED ORDER — TRAMADOL HCL 50 MG PO TABS: 50.0000 mg | ORAL_TABLET | Freq: Three times a day (TID) | ORAL | 0 refills | Status: AC | PRN

## 2021-02-04 NOTE — Patient Instructions (Signed)
Prednisone burst to decrease inflammation Tramadol for 5 days as needed for pain You should be avoiding NSAIDs (ibuprofen, Aleve, etc.) since you are on Plavix Exercises/stretches provided Referral for Physical Therapy placed If not improving in 4 weeks or if worse before then, schedule an appointment with Dr. Dianah Field, Sports Medicine, in our office.

## 2021-02-04 NOTE — Progress Notes (Signed)
Acute Office Visit  Subjective:    Patient ID: William Wheeler, male    DOB: 08/09/1950, 70 y.o.   MRN: [id number]  Chief Complaint  Patient presents with   Back Pain    X - 16 days (old injury)    HPI Patient is in today for back pain.   Patient states that for the past 2 weeks he has been having some left lower back/SI joint discomfort.  States he does have a history of this from about 25 years ago and every 2 to 3 years he will have a flareup that usually resolves with rest, ibuprofen, heat/ice.  States he did have to have an injection many many years ago.  Reports that usually his prior provider gave him ibuprofen and oxycodone to manage his pain during these flares.  He reports that his pain tends to improve slightly once he gets up and moving around, but the initial change of position/bending is very painful.  He does have trouble lying down and getting comfortable at night due to the discomfort.  States that exercise does tend to help.  He denies any radiation into his legs, saddle paresthesia, incontinence, numbness, tingling, weakness, loss of function/motion.   Past Medical History:  Diagnosis Date   Alcohol abuse    Alcohol addiction ([place])    Colon polyps    Diverticulitis    Hypercholesteremia    Hypertension    Stroke [facility])     Past Surgical History:  Procedure Laterality Date   broken bone repair     Nose   HERNIA REPAIR      Family History  Problem Relation Age of Onset   Leukemia Mother    Non-Hodgkin's lymphoma Mother    Other Father        unsure of medical history    Social History   Socioeconomic History   Marital status: Married    Spouse name: Not on file   Number of children: 2   Years of education: some college   Highest education level: Not on file  Occupational History   Occupation: real Sport and exercise psychologist  Tobacco Use   Smoking status: Former    Packs/day: 2.50    Years: 29.00    Pack years: 72.50    Types: Cigarettes    Quit date:  2001    Years since quitting: 21.9   Smokeless tobacco: Never  Vaping Use   Vaping Use: Never used  Substance and Sexual Activity   Alcohol use: Not Currently   Drug use: Not Currently    Types: Marijuana   Sexual activity: Not Currently    Partners: Female  Other Topics Concern   Not on file  Social History Narrative   Lives at home with his wife.   Right-handed.   3-4 cups caffeine per day.   Two stepchildren.    Social Determinants of Health   Financial Resource Strain: Not on file  Food Insecurity: Not on file  Transportation Needs: Not on file  Physical Activity: Not on file  Stress: Not on file  Social Connections: Not on file  Intimate Partner Violence: Not on file    Outpatient Medications Prior to Visit  Medication Sig Dispense Refill   acetaminophen (TYLENOL) 325 MG tablet Take 650 mg by mouth every 6 (six) hours as needed.     atorvastatin (LIPITOR) 40 MG tablet Take 1 tablet (40 mg total) by mouth daily. 90 tablet 3   clopidogrel (PLAVIX) 75 MG tablet Take 1  tablet (75 mg total) by mouth daily. 90 tablet 3   ibuprofen (ADVIL) 200 MG tablet Take 200 mg by mouth every 6 (six) hours as needed.     losartan-hydrochlorothiazide (HYZAAR) 50-12.5 MG tablet Take 1 tablet by mouth daily. 90 tablet 3   No facility-administered medications prior to visit.    No Known Allergies  Review of Systems All review of systems negative except what is listed in the HPI     Objective:    Physical Exam Vitals reviewed.  Constitutional:      Appearance: Normal appearance. He is normal weight.  HENT:     Head: Normocephalic and atraumatic.  Musculoskeletal:        General: Tenderness present. No swelling. Normal range of motion.     Right lower leg: No edema.     Left lower leg: No edema.     Comments: Left SI joint tenderness to deep palpation  Skin:    General: Skin is warm and dry.     Findings: No erythema or rash.  Neurological:     General: No focal deficit  present.     Mental Status: He is alert and oriented to person, place, and time. Mental status is at baseline.  Psychiatric:        Mood and Affect: Mood normal.        Behavior: Behavior normal.        Thought Content: Thought content normal.        Judgment: Judgment normal.    BP 133/78 (BP Location: Left Arm, Patient Position: Sitting, Cuff Size: Normal)   Pulse (!) 55   Temp 97.8 F (36.6 C) (Oral)   Wt 223 lb 0.6 oz (101.2 kg)   SpO2 97%   BMI 28.64 kg/m  Wt Readings from Last 3 Encounters:  02/04/21 223 lb 0.6 oz (101.2 kg)  06/02/20 216 lb 8 oz (98.2 kg)  05/26/20 215 lb (97.5 kg)    Health Maintenance Due  Topic Date Due   Hepatitis C Screening  Never done    There are no preventive care reminders to display for this patient.   Lab Results  Component Value Date   TSH 1.38 05/16/2019   Lab Results  Component Value Date   WBC 9.0 05/16/2019   HGB 14.5 05/16/2019   HCT 43.1 05/16/2019   MCV 86.5 05/16/2019   PLT 267 05/16/2019   Lab Results  Component Value Date   NA 141 10/08/2019   K 4.5 10/08/2019   CO2 26 10/08/2019   GLUCOSE 100 (H) 10/08/2019   BUN 19 10/08/2019   CREATININE 1.15 10/08/2019   BILITOT 0.5 10/08/2019   AST 17 10/08/2019   ALT 16 10/08/2019   PROT 6.9 10/08/2019   CALCIUM 9.9 10/08/2019   Lab Results  Component Value Date   CHOL 129 10/08/2019   Lab Results  Component Value Date   HDL 47 10/08/2019   Lab Results  Component Value Date   LDLCALC 68 10/08/2019   Lab Results  Component Value Date   TRIG 61 10/08/2019   Lab Results  Component Value Date   CHOLHDL 2.7 10/08/2019   No results found for: HGBA1C     Assessment & Plan:   1. Acute left-sided low back pain without sciatica Discussed case with Dr. Darene Lamer, sports medicine.  Given his use of Plavix prefer to avoid NSAIDs.  Will give a short burst of prednisone and 5 days of PRN tramadol.  Handout for SI  joint stretches provided.  Referral to physical therapy  placed.  Continue other supportive measures at home including avoiding triggering movements, ice/heat, stretching.  Patient aware of signs/symptoms requiring further/urgent evaluation.  - predniSONE (DELTASONE) 20 MG tablet; Take 2 tablets (40 mg total) by mouth daily with breakfast for 5 days.  Dispense: 10 tablet; Refill: 0 - traMADol (ULTRAM) 50 MG tablet; Take 1 tablet (50 mg total) by mouth every 8 (eight) hours as needed for up to 5 days.  Dispense: 15 tablet; Refill: 0 - Ambulatory referral to Physical Therapy  If no improvement over the next 4 to 6 weeks, follow-up with Dr. Darene Lamer to see if injection would be a possibility. Please contact office for sooner follow-up if symptoms worsen. Seek emergency care if symptoms become severe.   Purcell Nails Olevia Bowens, DNP, FNP-C

## 2021-02-11 ENCOUNTER — Ambulatory Visit: Payer: Medicare Other | Admitting: Rehabilitative and Restorative Service Providers"

## 2021-02-12 ENCOUNTER — Telehealth: Payer: Self-pay

## 2021-02-12 DIAGNOSIS — I10 Essential (primary) hypertension: Secondary | ICD-10-CM | POA: Diagnosis not present

## 2021-02-12 DIAGNOSIS — Z79899 Other long term (current) drug therapy: Secondary | ICD-10-CM | POA: Diagnosis not present

## 2021-02-12 DIAGNOSIS — R7989 Other specified abnormal findings of blood chemistry: Secondary | ICD-10-CM | POA: Diagnosis not present

## 2021-02-12 DIAGNOSIS — Z7902 Long term (current) use of antithrombotics/antiplatelets: Secondary | ICD-10-CM | POA: Diagnosis not present

## 2021-02-12 DIAGNOSIS — H538 Other visual disturbances: Secondary | ICD-10-CM | POA: Diagnosis not present

## 2021-02-12 NOTE — Telephone Encounter (Signed)
Noted! Thank you

## 2021-02-12 NOTE — Telephone Encounter (Signed)
William Wheeler called and states he woke this morning with blurred vision. He has a history of TIA and CVA. I advised him to have someone take him to the ED ASAP.

## 2021-03-11 ENCOUNTER — Other Ambulatory Visit: Payer: Self-pay

## 2021-03-11 ENCOUNTER — Ambulatory Visit (INDEPENDENT_AMBULATORY_CARE_PROVIDER_SITE_OTHER): Payer: Medicare Other

## 2021-03-11 ENCOUNTER — Ambulatory Visit (INDEPENDENT_AMBULATORY_CARE_PROVIDER_SITE_OTHER): Payer: Medicare Other | Admitting: Sports Medicine

## 2021-03-11 DIAGNOSIS — M545 Low back pain, unspecified: Secondary | ICD-10-CM

## 2021-03-11 DIAGNOSIS — G8929 Other chronic pain: Secondary | ICD-10-CM

## 2021-03-11 NOTE — Progress Notes (Signed)
° ° °  Procedures performed today:    None.  Independent interpretation of notes and tests performed by another provider:   None.  Brief History, Exam, Impression, and Recommendations:    Chronic low back pain This is a pleasant 71 year old male, he has a long history of low back pain with nocturnal cramping. Seen by Caleen Jobs, DNP, treated with prednisone, physical therapy, he has done a little better, still has axial pain in the left low back near the sacroiliac joint, nocturnal cramping down both legs. He does have gabapentin at home, he will restart this nightly. Continue physical therapy, I would like lumbar spine and SI joint x-rays, if insufficient improvement over the next month we will proceed with either an MRI if the symptoms declare themselves is predominately lumbar spinal stenosis, versus left SI joint injection.    ___________________________________________ Gwen Her. Dianah Field, M.D., ABFM., CAQSM. Primary Care and [place] [employer] of [place] [employer] [facility]

## 2021-03-11 NOTE — Assessment & Plan Note (Signed)
This is a pleasant 71 year old male, he has a long history of low back pain with nocturnal cramping. Seen by Caleen Jobs, DNP, treated with prednisone, physical therapy, he has done a little better, still has axial pain in the left low back near the sacroiliac joint, nocturnal cramping down both legs. He does have gabapentin at home, he will restart this nightly. Continue physical therapy, I would like lumbar spine and SI joint x-rays, if insufficient improvement over the next month we will proceed with either an MRI if the symptoms declare themselves is predominately lumbar spinal stenosis, versus left SI joint injection.

## 2021-03-17 DIAGNOSIS — H43821 Vitreomacular adhesion, right eye: Secondary | ICD-10-CM | POA: Diagnosis not present

## 2021-03-17 DIAGNOSIS — H2513 Age-related nuclear cataract, bilateral: Secondary | ICD-10-CM | POA: Diagnosis not present

## 2021-03-17 DIAGNOSIS — H524 Presbyopia: Secondary | ICD-10-CM | POA: Diagnosis not present

## 2021-03-17 DIAGNOSIS — H43812 Vitreous degeneration, left eye: Secondary | ICD-10-CM | POA: Diagnosis not present

## 2021-04-01 ENCOUNTER — Ambulatory Visit (INDEPENDENT_AMBULATORY_CARE_PROVIDER_SITE_OTHER): Payer: Medicare Other | Admitting: Family Medicine

## 2021-04-01 ENCOUNTER — Encounter: Payer: Self-pay | Admitting: Family Medicine

## 2021-04-01 ENCOUNTER — Other Ambulatory Visit: Payer: Self-pay

## 2021-04-01 VITALS — BP 104/67 | HR 67 | Ht 74.0 in | Wt 225.0 lb

## 2021-04-01 DIAGNOSIS — I639 Cerebral infarction, unspecified: Secondary | ICD-10-CM

## 2021-04-01 DIAGNOSIS — I10 Essential (primary) hypertension: Secondary | ICD-10-CM | POA: Diagnosis not present

## 2021-04-01 DIAGNOSIS — M2550 Pain in unspecified joint: Secondary | ICD-10-CM | POA: Diagnosis not present

## 2021-04-01 DIAGNOSIS — L989 Disorder of the skin and subcutaneous tissue, unspecified: Secondary | ICD-10-CM | POA: Insufficient documentation

## 2021-04-01 DIAGNOSIS — Z8719 Personal history of other diseases of the digestive system: Secondary | ICD-10-CM

## 2021-04-01 MED ORDER — AMOXICILLIN-POT CLAVULANATE 875-125 MG PO TABS: 1.0000 | ORAL_TABLET | Freq: Two times a day (BID) | ORAL | 0 refills | Status: DC

## 2021-04-01 NOTE — Assessment & Plan Note (Signed)
Referral placed to dermatology

## 2021-04-01 NOTE — Progress Notes (Signed)
William Wheeler - 71 y.o. male MRN [id number]  Date of birth: 1950-05-28  Subjective Chief Complaint  Patient presents with   Hospitalization Follow-up    HPI Rush Landmark is a 71 year old male here today for follow-up visit.  He is transferring care from Dr. Sheppard Coil.  To the hospital last month for blurred vision.  Exam and work-up without any abnormalities at that time.  Has been consuming a small amount of delta 8 THC Gummies and has been under increased stress due to his wife being out of the country.  He has not had any further symptoms since that time.  Has had some left lower quadrant pain recently.  Feels similar to previous episodes of diverticulitis.  He did have some leftover Augmentin at home which she started and symptoms improved.  He does feel like symptoms have worsened slightly today again.  He has had some slight changes to his bowels.  He denies fever, chills.  He has noted some areas on his face that have been difficult to heal and bleed easily.  Significant sun exposure throughout the years.  He is requesting referral to dermatology.  Some chronic tinnitus which she has seen ENT for in the past.  No dizziness at this time.  Blood pressures remain well controlled with combination of losartan and hydrochlorothiazide.  Tolerating well without side  effects.  He also had some intermittent swelling of his right ankle with some pain in his right great toe.  No prior injury.  ROS:  A comprehensive ROS was completed and negative except as noted per HPI   No Known Allergies  Past Medical History:  Diagnosis Date   Alcohol abuse    Alcohol addiction ([place])    Colon polyps    Diverticulitis    Hypercholesteremia    Hypertension    Stroke [facility])     Past Surgical History:  Procedure Laterality Date   broken bone repair     Nose   HERNIA REPAIR      Social History   Socioeconomic History   Marital status: Married    Spouse name: Not on file   Number of children: 2    Years of education: some college   Highest education level: Not on file  Occupational History   Occupation: real Sport and exercise psychologist  Tobacco Use   Smoking status: Former    Packs/day: 2.50    Years: 29.00    Pack years: 72.50    Types: Cigarettes    Quit date: 2001    Years since quitting: 22.0   Smokeless tobacco: Never  Vaping Use   Vaping Use: Never used  Substance and Sexual Activity   Alcohol use: Not Currently   Drug use: Not Currently    Types: Marijuana   Sexual activity: Not Currently    Partners: Female  Other Topics Concern   Not on file  Social History Narrative   Lives at home with his wife.   Right-handed.   3-4 cups caffeine per day.   Two stepchildren.    Social Determinants of Health   Financial Resource Strain: Not on file  Food Insecurity: Not on file  Transportation Needs: Not on file  Physical Activity: Not on file  Stress: Not on file  Social Connections: Not on file    Family History  Problem Relation Age of Onset   Leukemia Mother    Non-Hodgkin's lymphoma Mother    Other Father        unsure of medical history  Health Maintenance  Topic Date Due   Hepatitis C Screening  Never done   COVID-19 Vaccine (5 - Booster for Pfizer series) 11/25/2020   Zoster Vaccines- Shingrix (2 of 2) 05/05/2021 (Originally 10/12/2019)   TETANUS/TDAP  05/26/2021 (Originally 02/11/1972)   INFLUENZA VACCINE  06/04/2021 (Originally 10/05/2020)   Pneumonia Vaccine 15+ Years old (1 - PCV) 02/04/2022 (Originally 02/11/1959)   Fecal DNA (Cologuard)  06/02/2023   HPV VACCINES  Aged Out     ----------------------------------------------------------------------------------------------------------------------------------------------------------------------------------------------------------------- Physical Exam BP 104/67 (BP Location: Left Arm, Patient Position: Sitting, Cuff Size: Normal)    Pulse 67    Ht 6\' 2"  (1.88 m)    Wt 225 lb (102.1 kg)    SpO2 99%    BMI 28.89  kg/m   Physical Exam Constitutional:      Appearance: Normal appearance.  Eyes:     General: No scleral icterus. Cardiovascular:     Rate and Rhythm: Normal rate and regular rhythm.  Pulmonary:     Effort: Pulmonary effort is normal.     Breath sounds: Normal breath sounds.  Abdominal:     General: Abdomen is flat. There is no distension.     Palpations: Abdomen is soft.     Tenderness: There is no abdominal tenderness.  Musculoskeletal:     Cervical back: Neck supple.  Neurological:     General: No focal deficit present.     Mental Status: He is alert.  Psychiatric:        Mood and Affect: Mood normal.        Behavior: Behavior normal.    ------------------------------------------------------------------------------------------------------------------------------------------------------------------------------------------------------------------- Assessment and Plan  Essential hypertension Blood pressure remains well controlled with current medications.  Continue medication at current strength.  Updating labs today.  History of colonic diverticulitis Increased pain recently, similar to previous diverticulitis episodes.  Adding Augmentin back on.  Instructed to let me know if this is worsening.  Arthralgia Checking uric acid levels with current labs.  Non-healing skin lesion Referral placed to dermatology.   Meds ordered this encounter  Medications   amoxicillin-clavulanate (AUGMENTIN) 875-125 MG tablet    Sig: Take 1 tablet by mouth 2 (two) times daily.    Dispense:  20 tablet    Refill:  0    Return in about 6 months (around 09/29/2021) for HTN.    This visit occurred during the SARS-CoV-2 public health emergency.  Safety protocols were in place, including screening questions prior to the visit, additional usage of staff PPE, and extensive cleaning of exam room while observing appropriate contact time as indicated for disinfecting solutions.

## 2021-04-01 NOTE — Assessment & Plan Note (Signed)
Blood pressure remains well controlled with current medications.  Continue medication at current strength.  Updating labs today.

## 2021-04-01 NOTE — Patient Instructions (Signed)
Nice to see you today! Have labs completed.  Start augmentin.  Let me know if symptoms do not resolve.  You should hear from dermatology for appointment.

## 2021-04-01 NOTE — Assessment & Plan Note (Signed)
Increased pain recently, similar to previous diverticulitis episodes.  Adding Augmentin back on.  Instructed to let me know if this is worsening.

## 2021-04-01 NOTE — Assessment & Plan Note (Signed)
Checking uric acid levels with current labs.

## 2021-04-02 DIAGNOSIS — I639 Cerebral infarction, unspecified: Secondary | ICD-10-CM | POA: Diagnosis not present

## 2021-04-02 DIAGNOSIS — I10 Essential (primary) hypertension: Secondary | ICD-10-CM | POA: Diagnosis not present

## 2021-04-02 DIAGNOSIS — M2550 Pain in unspecified joint: Secondary | ICD-10-CM | POA: Diagnosis not present

## 2021-04-03 LAB — COMPLETE METABOLIC PANEL WITH GFR
AG Ratio: 1.6 (calc) (ref 1.0–2.5)
ALT: 10 U/L (ref 9–46)
AST: 13 U/L (ref 10–35)
Albumin: 4.1 g/dL (ref 3.6–5.1)
Alkaline phosphatase (APISO): 35 U/L (ref 35–144)
BUN: 25 mg/dL (ref 7–25)
CO2: 27 mmol/L (ref 20–32)
Calcium: 9.7 mg/dL (ref 8.6–10.3)
Chloride: 108 mmol/L (ref 98–110)
Creat: 1.05 mg/dL (ref 0.70–1.35)
Globulin: 2.6 g/dL (calc) (ref 1.9–3.7)
Glucose, Bld: 93 mg/dL (ref 65–99)
Potassium: 4.2 mmol/L (ref 3.5–5.3)
Sodium: 142 mmol/L (ref 135–146)
Total Bilirubin: 0.4 mg/dL (ref 0.2–1.2)
Total Protein: 6.7 g/dL (ref 6.1–8.1)
eGFR: 77 mL/min/{1.73_m2} (ref >=60)

## 2021-04-03 LAB — CBC WITH DIFFERENTIAL/PLATELET
Absolute Monocytes: 689 cells/uL (ref 200–950)
Basophils Absolute: 73 cells/uL (ref 0–200)
Basophils Relative: 0.9 %
Eosinophils Absolute: 219 cells/uL (ref 15–500)
Eosinophils Relative: 2.7 %
HCT: 44 % (ref 38.5–50.0)
Hemoglobin: 14.6 g/dL (ref 13.2–17.1)
Lymphs Abs: 2074 cells/uL (ref 850–3900)
MCH: 29 pg (ref 27.0–33.0)
MCHC: 33.2 g/dL (ref 32.0–36.0)
MCV: 87.5 fL (ref 80.0–100.0)
MPV: 10.5 fL (ref 7.5–12.5)
Monocytes Relative: 8.5 %
Neutro Abs: 5046 cells/uL (ref 1500–7800)
Neutrophils Relative %: 62.3 %
Platelets: 262 10*3/uL (ref 140–400)
RBC: 5.03 10*6/uL (ref 4.20–5.80)
RDW: 13 % (ref 11.0–15.0)
Total Lymphocyte: 25.6 %
WBC: 8.1 10*3/uL (ref 3.8–10.8)

## 2021-04-03 LAB — LIPID PANEL W/REFLEX DIRECT LDL
Cholesterol: 142 mg/dL (ref <200)
HDL: 45 mg/dL (ref >=40)
LDL Cholesterol (Calc): 83 mg/dL (calc)
Non-HDL Cholesterol (Calc): 97 mg/dL (calc) (ref <130)
Total CHOL/HDL Ratio: 3.2 (calc) (ref <5.0)
Triglycerides: 62 mg/dL (ref <150)

## 2021-04-03 LAB — URIC ACID: Uric Acid, Serum: 7.4 mg/dL (ref 4.0–8.0)

## 2021-04-08 ENCOUNTER — Ambulatory Visit: Payer: Medicare Other | Admitting: Sports Medicine

## 2021-04-12 DIAGNOSIS — L570 Actinic keratosis: Secondary | ICD-10-CM | POA: Diagnosis not present

## 2021-04-12 DIAGNOSIS — L579 Skin changes due to chronic exposure to nonionizing radiation, unspecified: Secondary | ICD-10-CM | POA: Diagnosis not present

## 2021-04-12 DIAGNOSIS — L814 Other melanin hyperpigmentation: Secondary | ICD-10-CM | POA: Diagnosis not present

## 2021-04-12 DIAGNOSIS — D485 Neoplasm of uncertain behavior of skin: Secondary | ICD-10-CM | POA: Diagnosis not present

## 2021-04-12 DIAGNOSIS — L821 Other seborrheic keratosis: Secondary | ICD-10-CM | POA: Diagnosis not present

## 2021-04-12 DIAGNOSIS — C44319 Basal cell carcinoma of skin of other parts of face: Secondary | ICD-10-CM | POA: Diagnosis not present

## 2021-05-13 DIAGNOSIS — H524 Presbyopia: Secondary | ICD-10-CM | POA: Diagnosis not present

## 2021-05-13 DIAGNOSIS — H0101B Ulcerative blepharitis left eye, upper and lower eyelids: Secondary | ICD-10-CM | POA: Diagnosis not present

## 2021-05-13 DIAGNOSIS — H0101A Ulcerative blepharitis right eye, upper and lower eyelids: Secondary | ICD-10-CM | POA: Diagnosis not present

## 2021-05-13 DIAGNOSIS — H04123 Dry eye syndrome of bilateral lacrimal glands: Secondary | ICD-10-CM | POA: Diagnosis not present

## 2021-05-20 DIAGNOSIS — L814 Other melanin hyperpigmentation: Secondary | ICD-10-CM | POA: Diagnosis not present

## 2021-05-20 DIAGNOSIS — Z85828 Personal history of other malignant neoplasm of skin: Secondary | ICD-10-CM | POA: Diagnosis not present

## 2021-05-20 DIAGNOSIS — L821 Other seborrheic keratosis: Secondary | ICD-10-CM | POA: Diagnosis not present

## 2021-05-20 DIAGNOSIS — L579 Skin changes due to chronic exposure to nonionizing radiation, unspecified: Secondary | ICD-10-CM | POA: Diagnosis not present

## 2021-05-20 DIAGNOSIS — D235 Other benign neoplasm of skin of trunk: Secondary | ICD-10-CM | POA: Diagnosis not present

## 2021-05-20 DIAGNOSIS — L570 Actinic keratosis: Secondary | ICD-10-CM | POA: Diagnosis not present

## 2021-06-02 ENCOUNTER — Telehealth: Payer: Self-pay | Admitting: Family Medicine

## 2021-06-02 NOTE — Telephone Encounter (Signed)
Pt called and said he is having a severe diverticulitis flare up in his abdomen. He wants to know if you can call in a antibiotic. He also would like a referral to a Gastro.  ?

## 2021-06-02 NOTE — Telephone Encounter (Signed)
Needs appt

## 2021-06-03 ENCOUNTER — Encounter: Payer: Self-pay | Admitting: Family Medicine

## 2021-06-03 NOTE — Telephone Encounter (Signed)
Please contact pt for an appointment. Pt left a new vm with the same information as yesterday. Dr. Zigmund Daniel requests appts for medications. Thanks ?

## 2021-06-04 ENCOUNTER — Other Ambulatory Visit: Payer: Self-pay | Admitting: Osteopathic Medicine

## 2021-06-04 ENCOUNTER — Encounter: Payer: Self-pay | Admitting: Emergency Medicine

## 2021-06-04 ENCOUNTER — Emergency Department (INDEPENDENT_AMBULATORY_CARE_PROVIDER_SITE_OTHER)
Admission: EM | Admit: 2021-06-04 | Discharge: 2021-06-04 | Disposition: A | Discharge status: Home / Self Care | Payer: Medicare Other | Source: Home / Self Care | Attending: Family Medicine | Admitting: Family Medicine

## 2021-06-04 DIAGNOSIS — R1032 Left lower quadrant pain: Secondary | ICD-10-CM | POA: Diagnosis not present

## 2021-06-04 DIAGNOSIS — K5732 Diverticulitis of large intestine without perforation or abscess without bleeding: Secondary | ICD-10-CM

## 2021-06-04 MED ORDER — AMOXICILLIN-POT CLAVULANATE 875-125 MG PO TABS: ORAL_TABLET | ORAL | 0 refills | Status: DC

## 2021-06-04 NOTE — Telephone Encounter (Signed)
Pt went to urgent care. He will contact Dr. Zigmund Daniel later for another appt.  ?

## 2021-06-04 NOTE — ED Triage Notes (Signed)
Hx of diverticulitis  ?Flare up 2 days ago after eating a salad ?Worse one in years ?Mashed Potato diet last 2 days  ? ? ?

## 2021-06-04 NOTE — Discharge Instructions (Signed)
Begin clear liquids for about 24 hours.  When improved, advance to a [employer] (Bananas, Rice, Applesauce, Toast). Then gradually resume a regular diet when tolerated.    ?May take Tylenol as needed for pain. ? ?If symptoms become significantly worse during the night or over the weekend, proceed to the local emergency room. ? ? ?

## 2021-06-04 NOTE — ED Provider Notes (Signed)
?[facility] ? ? ? ?CSN: [id number] ?Arrival date & time: 06/04/21  8185 ? ? ?  ? ?History   ?Chief Complaint ?Chief Complaint  ?Patient presents with  ? Abdominal Pain  ?  lower  ? ? ?HPI ?William Wheeler is a 71 y.o. male.  ? ?Patient complains of onset of left lower quadrant abdominal pain two days ago after eating a salad.  He has a history of recurring diverticulitis and states that his present pain is similar.  He has been constipated during the past several days and last night developed fever/chills.  He denies nausea/vomiting.  He states that he had a colonoscopy about 20 years ago but has not had follow-up. ?Review of past records reveals a CT abdomen 03/13/2001 that showed diverticulitis in the descending colon. ? ?The history is provided by the patient.  ?Abdominal Pain ?Pain location:  LLQ ?Pain quality: cramping, dull and squeezing   ?Pain radiates to:  Does not radiate ?Pain severity:  Mild ?Onset quality:  Gradual ?Duration:  5 days ?Timing:  Constant ?Progression:  Worsening ?Chronicity:  Recurrent ?Context: diet changes   ?Context: not recent illness and not suspicious food intake   ?Relieved by:  Nothing ?Worsened by:  Palpation ?Ineffective treatments:  None tried ?Associated symptoms: chills, constipation and fever   ?Associated symptoms: no dysuria, no hematochezia, no hematuria, no melena and no nausea   ? ?Past Medical History:  ?Diagnosis Date  ? Alcohol abuse   ? Alcohol addiction ([place])   ? Colon polyps   ? Diverticulitis   ? Diverticulitis   ? Hypercholesteremia   ? Hypertension   ? Stroke [facility])   ? ? ?Patient Active Problem List  ? Diagnosis Date Noted  ? Non-healing skin lesion 04/01/2021  ? Arthralgia 04/01/2021  ? Chronic low back pain 03/11/2021  ? TIA (transient ischemic attack) 06/02/2020  ? Inguinal hernia 12/05/2019  ? History of CVA (cerebrovascular accident) without residual deficits 08/13/2019  ? Ischemic cerebrovascular accident (CVA) of frontal lobe ([place]) 07/02/2019  ?  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 Cervical stenosis of spinal canal 11/16/2018  ? 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 Herpes zoster without complication 63/14/9702  ? Essential hypertension 11/01/2018  ? Alcoholism in remission ([place]) 11/01/2018  ? 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 11/01/2018  ? Former very heavy cigarette smoker (more than 40 per day) 11/01/2018  ? History of colon polyps 11/01/2018  ? History of colonic diverticulitis 11/01/2018  ? Paresthesia of left arm 11/01/2018  ? Nocturia 11/01/2018  ? ? ?Past Surgical History:  ?Procedure Laterality Date  ? broken bone repair    ? Nose  ? HERNIA REPAIR    ? ? ? ? ? ?Home Medications   ? ?Prior to Admission medications   ?Medication Sig Start Date End Date Taking? Authorizing Provider  ?amoxicillin-clavulanate (AUGMENTIN) 875-125 MG tablet Take one tab PO Q8hr 06/04/21  Yes Anaijah Augsburger, Ishmael Holter, MD  ?acetaminophen (TYLENOL) 325 MG tablet Take 650 mg by mouth every 6 (six) hours as needed.    [provider]  ?atorvastatin (LIPITOR) 40 MG tablet Take 1 tablet (40 mg total) by mouth daily. 05/26/20   Emeterio Reeve, DO  ?clopidogrel (PLAVIX) 75 MG tablet Take 1 tablet (75 mg total) by mouth daily. 05/26/20   Emeterio Reeve, DO  ?ibuprofen (ADVIL) 200 MG tablet Take 200 mg by mouth every 6 (six) hours as needed.    [provider]  ?losartan-hydrochlorothiazide (HYZAAR) 50-12.5 MG tablet Take  1 tablet by mouth daily. 05/26/20   Emeterio Reeve, DO  ? ? ?Family History ?Family History  ?Problem Relation Age of Onset  ? Leukemia Mother   ? Non-Hodgkin's lymphoma Mother   ? Other Father   ?     unsure of medical history  ? ? ?Social History ?Social History  ? ?Tobacco Use  ? Smoking status: Former  ?  Packs/day: 2.50  ?  Years: 29.00  ?  Pack years: 72.50  ?  Types: Cigarettes  ?  Quit date: 2001  ?  Years since quitting: 22.2  ? Smokeless tobacco: Never  ?Vaping Use  ? Vaping Use: Never used  ?Substance Use Topics  ? Alcohol use:  Not Currently  ? Drug use: Not Currently  ?  Types: Marijuana  ? ? ? ?Allergies   ?Patient has no known allergies. ? ? ?Review of Systems ?Review of Systems  ?Constitutional:  Positive for chills and fever.  ?Gastrointestinal:  Positive for abdominal pain and constipation. Negative for hematochezia, melena and nausea.  ?Genitourinary:  Negative for dysuria and hematuria.  ?All other systems reviewed and are negative. ? ? ?Physical Exam ?Triage Vital Signs ?ED Triage Vitals  ?Enc Vitals Group  ?   BP 06/04/21 0937 (!) 135/93  ?   Pulse Rate 06/04/21 0937 (!) 58  ?   Resp 06/04/21 0937 15  ?   Temp 06/04/21 0937 98.4 ?F (36.9 ?C)  ?   Temp Source 06/04/21 0937 Oral  ?   SpO2 06/04/21 0937 97 %  ?   Weight 06/04/21 0938 225 lb (102.1 kg)  ?   Height 06/04/21 0938 '6\' 2"'$  (1.88 m)  ?   Head Circumference --   ?   Peak Flow --   ?   Pain Score 06/04/21 0938 4  ?   Pain Loc --   ?   Pain Edu? --   ?   Excl. in [place]? --   ? ?No data found. ? ?Updated Vital Signs ?BP (!) 135/93 (BP Location: Right Arm)   Pulse (!) 58   Temp 98.4 ?F (36.9 ?C) (Oral)   Resp 15   Ht '6\' 2"'$  (1.88 m)   Wt 102.1 kg   SpO2 97%   BMI 28.89 kg/m?  ? ?Visual Acuity ?Right Eye Distance:   ?Left Eye Distance:   ?Bilateral Distance:   ? ?Right Eye Near:   ?Left Eye Near:    ?Bilateral Near:    ? ?Physical Exam ?Vitals reviewed.  ?Constitutional:   ?   General: He is not in acute distress. ?   Appearance: He is not ill-appearing.  ?HENT:  ?   Head: Normocephalic.  ?Eyes:  ?   Extraocular Movements: Extraocular movements intact.  ?Cardiovascular:  ?   Rate and Rhythm: Normal rate and regular rhythm.  ?   Heart sounds: Normal heart sounds.  ?Pulmonary:  ?   Breath sounds: Normal breath sounds.  ?Abdominal:  ?   General: Abdomen is flat. Bowel sounds are decreased. There is no distension.  ?   Palpations: Abdomen is soft. There is no hepatomegaly or splenomegaly.  ?   Tenderness: There is abdominal tenderness in the left lower quadrant. There is no right  CVA tenderness, left CVA tenderness or guarding.  ?   Hernia: There is no hernia in the umbilical area or ventral area.  ? ? ?Skin: ?   General: Skin is warm and dry.  ?   Findings: No rash.  ?  Neurological:  ?   Mental Status: He is alert and oriented to person, place, and time.  ? ? ? ?UC Treatments / Results  ?Labs ?(all labs ordered are listed, but only abnormal results are displayed) ?Labs Reviewed - No data to display ? ?EKG ? ? ?Radiology ?No results found. ? ?Procedures ?Procedures (including critical care time) ? ?Medications Ordered in UC ?Medications - No data to display ? ?Initial Impression / Assessment and Plan / UC Course  ?I have reviewed the triage vital signs and the nursing notes. ? ?Pertinent labs & imaging results that were available during my care of the patient were reviewed by me and considered in my medical decision making (see chart for details). ? ?  ?Begin Augmentin. ?Followup with Family Doctor if not improved in about 5 days. ?Recommend follow-up by GI. ? ?Final Clinical Impressions(s) / UC Diagnoses  ? ?Final diagnoses:  ?Diverticulitis of colon  ?Left lower quadrant abdominal pain  ? ? ? ?Discharge Instructions   ? ?  ?Begin clear liquids for about 24 hours.  When improved, advance to a [employer] (Bananas, Rice, Applesauce, Toast). Then gradually resume a regular diet when tolerated.    ?May take Tylenol as needed for pain. ? ?If symptoms become significantly worse during the night or over the weekend, proceed to the local emergency room. ? ? ? ? ? ?ED Prescriptions   ? ? Medication Sig Dispense Auth. Provider  ? amoxicillin-clavulanate (AUGMENTIN) 875-125 MG tablet Take one tab PO Q8hr 21 tablet Kandra Nicolas, MD  ? ?  ? ? ?  ?Kandra Nicolas, MD ?06/06/21 1155 ? ?

## 2021-06-05 ENCOUNTER — Other Ambulatory Visit: Payer: Self-pay | Admitting: Osteopathic Medicine

## 2021-06-24 DIAGNOSIS — H0101B Ulcerative blepharitis left eye, upper and lower eyelids: Secondary | ICD-10-CM | POA: Diagnosis not present

## 2021-06-24 DIAGNOSIS — H0101A Ulcerative blepharitis right eye, upper and lower eyelids: Secondary | ICD-10-CM | POA: Diagnosis not present

## 2021-06-24 DIAGNOSIS — H04123 Dry eye syndrome of bilateral lacrimal glands: Secondary | ICD-10-CM | POA: Diagnosis not present

## 2021-08-04 DIAGNOSIS — C44319 Basal cell carcinoma of skin of other parts of face: Secondary | ICD-10-CM | POA: Diagnosis not present

## 2021-08-04 DIAGNOSIS — D485 Neoplasm of uncertain behavior of skin: Secondary | ICD-10-CM | POA: Diagnosis not present

## 2021-09-27 ENCOUNTER — Encounter: Payer: Self-pay | Admitting: Family Medicine

## 2021-09-30 ENCOUNTER — Ambulatory Visit: Payer: Medicare Other | Admitting: Family Medicine

## 2021-10-05 HISTORY — PX: OTHER SURGICAL HISTORY: SHX169

## 2021-10-27 ENCOUNTER — Encounter: Payer: Self-pay | Admitting: General Practice

## 2021-11-11 DIAGNOSIS — C44319 Basal cell carcinoma of skin of other parts of face: Secondary | ICD-10-CM | POA: Diagnosis not present

## 2021-11-18 DIAGNOSIS — A499 Bacterial infection, unspecified: Secondary | ICD-10-CM | POA: Diagnosis not present

## 2022-02-03 ENCOUNTER — Ambulatory Visit (INDEPENDENT_AMBULATORY_CARE_PROVIDER_SITE_OTHER): Payer: Medicare Other | Admitting: Family Medicine

## 2022-02-03 ENCOUNTER — Encounter: Payer: Self-pay | Admitting: Family Medicine

## 2022-02-03 VITALS — BP 134/80 | HR 59 | Ht 74.0 in | Wt 234.0 lb

## 2022-02-03 DIAGNOSIS — L989 Disorder of the skin and subcutaneous tissue, unspecified: Secondary | ICD-10-CM

## 2022-02-03 DIAGNOSIS — H9202 Otalgia, left ear: Secondary | ICD-10-CM

## 2022-02-03 DIAGNOSIS — R252 Cramp and spasm: Secondary | ICD-10-CM

## 2022-02-03 DIAGNOSIS — R7309 Other abnormal glucose: Secondary | ICD-10-CM | POA: Diagnosis not present

## 2022-02-03 DIAGNOSIS — R234 Changes in skin texture: Secondary | ICD-10-CM | POA: Diagnosis not present

## 2022-02-03 DIAGNOSIS — R202 Paresthesia of skin: Secondary | ICD-10-CM | POA: Diagnosis not present

## 2022-02-03 DIAGNOSIS — L600 Ingrowing nail: Secondary | ICD-10-CM | POA: Diagnosis not present

## 2022-02-03 DIAGNOSIS — R1312 Dysphagia, oropharyngeal phase: Secondary | ICD-10-CM | POA: Diagnosis not present

## 2022-02-03 MED ORDER — FLONASE SENSIMIST 27.5 MCG/SPRAY NA SUSP: 2.0000 | Freq: Every day | NASAL | 3 refills | Status: DC

## 2022-02-03 MED ORDER — VALACYCLOVIR HCL 1 G PO TABS: 1000.0000 mg | ORAL_TABLET | Freq: Three times a day (TID) | ORAL | 0 refills | Status: AC

## 2022-02-03 NOTE — Patient Instructions (Addendum)
Try adding flonase sensimist for a couple of weeks.  We'll be in touch with lab results.  You will be contacted for referrals.    If pain worsens in arm or spreads to back/neck or you develop rash go ahead and start valtrex.

## 2022-02-04 ENCOUNTER — Telehealth (HOSPITAL_COMMUNITY): Payer: Self-pay

## 2022-02-04 NOTE — Telephone Encounter (Signed)
Attempted to contact patient to schedule Modified Barium Swallow - left voicemail. 

## 2022-02-06 DIAGNOSIS — H9202 Otalgia, left ear: Secondary | ICD-10-CM | POA: Insufficient documentation

## 2022-02-06 DIAGNOSIS — L989 Disorder of the skin and subcutaneous tissue, unspecified: Secondary | ICD-10-CM | POA: Insufficient documentation

## 2022-02-06 DIAGNOSIS — R252 Cramp and spasm: Secondary | ICD-10-CM | POA: Insufficient documentation

## 2022-02-06 DIAGNOSIS — R234 Changes in skin texture: Secondary | ICD-10-CM | POA: Insufficient documentation

## 2022-02-06 DIAGNOSIS — R1312 Dysphagia, oropharyngeal phase: Secondary | ICD-10-CM | POA: Insufficient documentation

## 2022-02-06 NOTE — Progress Notes (Signed)
William Wheeler - 71 y.o. male MRN [id number]  Date of birth: 1950-10-19  Subjective Chief Complaint  Patient presents with   Ear Pain   Dysphagia   Memory Loss   Herpes Zoster   Shortness of Breath    HPI William Wheeler is a 71 year old male here today with a few concerns.  He has complaint of left ear pain.  Pain began a couple weeks ago.  Feels similar to previous ear infection he had.  Denies any drainage from the ear, hearing change, fever or chills.  Having some left shoulder pain.  This is similar to shingles he has had in the past.  He has not noted any rash.  Pain is burning sensation as well as slight tingling.  He is concerned about choking sensation when eating.  This happened 3-4 times over the past few months.  This can happen with both liquids as well as solid foods.  Denies reflux symptoms.  Denies sensation of food getting stuck after swallowing.  No regurgitation.  He reports having cramping sensation in his feet and legs.  Additionally has some numbness and tingling.  Prior history of alcoholism.  He has done well abstaining.  He has not had nutritional status checked in quite some time.  ROS:  A comprehensive ROS was completed and negative except as noted per HPI  No Known Allergies  Past Medical History:  Diagnosis Date   Alcohol abuse    Alcohol addiction ([place])    Colon polyps    Diverticulitis    Diverticulitis    Hypercholesteremia    Hypertension    Stroke [facility])     Past Surgical History:  Procedure Laterality Date   broken bone repair     Nose   HERNIA REPAIR      Social History   Socioeconomic History   Marital status: Married    Spouse name: Not on file   Number of children: 2   Years of education: some college   Highest education level: Not on file  Occupational History   Occupation: real Sport and exercise psychologist  Tobacco Use   Smoking status: Former    Packs/day: 2.50    Years: 29.00    Total pack years: 72.50    Types: Cigarettes    Quit date:  2001    Years since quitting: 22.9   Smokeless tobacco: Never  Vaping Use   Vaping Use: Never used  Substance and Sexual Activity   Alcohol use: Not Currently   Drug use: Not Currently    Types: Marijuana   Sexual activity: Not Currently    Partners: Female  Other Topics Concern   Not on file  Social History Narrative   Lives at home with his wife.   Right-handed.   3-4 cups caffeine per day.   Two stepchildren.    Social Determinants of Health   Financial Resource Strain: Not on file  Food Insecurity: Not on file  Transportation Needs: Not on file  Physical Activity: Not on file  Stress: Not on file  Social Connections: Not on file    Family History  Problem Relation Age of Onset   Leukemia Mother    Non-Hodgkin's lymphoma Mother    Other Father        unsure of medical history    Health Maintenance  Topic Date Due   Pneumonia Vaccine 46+ Years old (1 - PCV) Never done   DTaP/Tdap/Td (1 - Tdap) Never done   COVID-19 Vaccine (5 - 2023-24 season)  04/07/2022 (Originally 11/05/2021)   Medicare Annual Wellness (AWV)  04/07/2022 (Originally Jan 09, 1951)   Zoster Vaccines- Shingrix (2 of 2) 05/05/2022 (Originally 10/12/2019)   INFLUENZA VACCINE  06/05/2022 (Originally 10/05/2021)   Hepatitis C Screening  02/04/2023 (Originally 02/11/1971)   Fecal DNA (Cologuard)  06/02/2023   HPV VACCINES  Aged Out     ----------------------------------------------------------------------------------------------------------------------------------------------------------------------------------------------------------------- Physical Exam BP 134/80 (BP Location: Left Arm, Patient Position: Sitting, Cuff Size: Large)   Pulse (!) 59   Ht '6\' 2"'$  (1.88 m)   Wt 234 lb (106.1 kg)   SpO2 98%   BMI 30.04 kg/m   Physical Exam Constitutional:      Appearance: He is well-developed.  HENT:     Head: Normocephalic and atraumatic.     Right Ear: Tympanic membrane normal.     Left Ear: Tympanic  membrane normal.  Cardiovascular:     Rate and Rhythm: Normal rate and regular rhythm.  Pulmonary:     Effort: Pulmonary effort is normal.     Breath sounds: Normal breath sounds.  Musculoskeletal:     Cervical back: Neck supple.  Neurological:     General: No focal deficit present.     Mental Status: He is alert.     ------------------------------------------------------------------------------------------------------------------------------------------------------------------------------------------------------------------- Assessment and Plan  Oropharyngeal dysphagia Does report symptoms.  Modified barium swallow ordered for further evaluation.  Cracked skin on feet He is requesting referral to podiatry.  Orders entered.  Paresthesia of left arm He is having some paresthesias as well as burning sensation of the left arm and shoulder.  This feels similar to previous shingles.  No rash at this time.  We will go ahead and add Valtrex.  He will let me know if not improving or worsening.  Muscle cramping Checking labs today. Orders Placed This Encounter  Procedures   CBC with Differential   COMPLETE METABOLIC PANEL WITH GFR   Magnesium   B12   Folate   Vitamin B1   HgB A1c   Ambulatory referral to Dermatology    Referral Priority:   Routine    Referral Type:   Consultation    Referral Reason:   Specialty Services Required    Requested Specialty:   Dermatology    Number of Visits Requested:   1   Ambulatory referral to Podiatry    Referral Priority:   Routine    Referral Type:   Consultation    Referral Reason:   Specialty Services Required    Requested Specialty:   Podiatry    Number of Visits Requested:   1   SLP modified barium swallow    Standing Status:   Future    Standing Expiration Date:   02/04/2023    Order Specific Question:   Where should this test be performed:    Answer:   [facility]    Order Specific Question:   Please indicate reason for Referral:     Answer:   Concerned about Dysphagia/Aspiration    Order Specific Question:   Patients current diet consistency:    Answer:   Regular    Order Specific Question:   Patients current liquid consistency:    Answer:   Thin (All Liquid Allowed)    Order Specific Question:   Existing signs/symptoms of possible Aspiration/Dysphagia:    Answer:   Cough with Meals/Meds/Other P.O.s     Non-healing skin lesion Would like to continue to have regular skin checks.  Requesting referral to dermatology in [place].  Left ear pain Serous otitis  media.  No sign of bacterial infection at this time.  Recommend adding Flonase and antihistamine daily.   Meds ordered this encounter  Medications   valACYclovir (VALTREX) 1000 MG tablet    Sig: Take 1 tablet (1,000 mg total) by mouth 3 (three) times daily for 7 days.    Dispense:  21 tablet    Refill:  0   fluticasone (FLONASE SENSIMIST) 27.5 MCG/SPRAY nasal spray    Sig: Place 2 sprays into the nose daily.    Dispense:  10 g    Refill:  3    No follow-ups on file.    This visit occurred during the SARS-CoV-2 public health emergency.  Safety protocols were in place, including screening questions prior to the visit, additional usage of staff PPE, and extensive cleaning of exam room while observing appropriate contact time as indicated for disinfecting solutions.

## 2022-02-06 NOTE — Assessment & Plan Note (Signed)
Serous otitis media.  No sign of bacterial infection at this time.  Recommend adding Flonase and antihistamine daily.

## 2022-02-06 NOTE — Assessment & Plan Note (Signed)
He is having some paresthesias as well as burning sensation of the left arm and shoulder.  This feels similar to previous shingles.  No rash at this time.  We will go ahead and add Valtrex.  He will let me know if not improving or worsening.

## 2022-02-06 NOTE — Assessment & Plan Note (Signed)
Would like to continue to have regular skin checks.  Requesting referral to dermatology in [place].

## 2022-02-06 NOTE — Assessment & Plan Note (Signed)
Does report symptoms.  Modified barium swallow ordered for further evaluation.

## 2022-02-06 NOTE — Assessment & Plan Note (Signed)
He is requesting referral to podiatry.  Orders entered.

## 2022-02-06 NOTE — Assessment & Plan Note (Signed)
Checking labs today. Orders Placed This Encounter  Procedures   CBC with Differential   COMPLETE METABOLIC PANEL WITH GFR   Magnesium   B12   Folate   Vitamin B1   HgB A1c   Ambulatory referral to Dermatology    Referral Priority:   Routine    Referral Type:   Consultation    Referral Reason:   Specialty Services Required    Requested Specialty:   Dermatology    Number of Visits Requested:   1   Ambulatory referral to Podiatry    Referral Priority:   Routine    Referral Type:   Consultation    Referral Reason:   Specialty Services Required    Requested Specialty:   Podiatry    Number of Visits Requested:   1   SLP modified barium swallow    Standing Status:   Future    Standing Expiration Date:   02/04/2023    Order Specific Question:   Where should this test be performed:    Answer:   [facility]    Order Specific Question:   Please indicate reason for Referral:    Answer:   Concerned about Dysphagia/Aspiration    Order Specific Question:   Patients current diet consistency:    Answer:   Regular    Order Specific Question:   Patients current liquid consistency:    Answer:   Thin (All Liquid Allowed)    Order Specific Question:   Existing signs/symptoms of possible Aspiration/Dysphagia:    Answer:   Cough with Meals/Meds/Other P.O.s

## 2022-02-09 ENCOUNTER — Ambulatory Visit (INDEPENDENT_AMBULATORY_CARE_PROVIDER_SITE_OTHER): Payer: Medicare Other | Admitting: Family Medicine

## 2022-02-09 DIAGNOSIS — Z0000 Encounter for general adult medical examination without abnormal findings: Secondary | ICD-10-CM | POA: Diagnosis not present

## 2022-02-09 LAB — CBC WITH DIFFERENTIAL/PLATELET
Absolute Monocytes: 768 cells/uL (ref 200–950)
Basophils Absolute: 67 cells/uL (ref 0–200)
Basophils Relative: 0.7 %
Eosinophils Absolute: 182 cells/uL (ref 15–500)
Eosinophils Relative: 1.9 %
HCT: 42.2 % (ref 38.5–50.0)
Hemoglobin: 14.2 g/dL (ref 13.2–17.1)
Lymphs Abs: 2688 cells/uL (ref 850–3900)
MCH: 29.3 pg (ref 27.0–33.0)
MCHC: 33.6 g/dL (ref 32.0–36.0)
MCV: 87.2 fL (ref 80.0–100.0)
MPV: 10.8 fL (ref 7.5–12.5)
Monocytes Relative: 8 %
Neutro Abs: 5894 cells/uL (ref 1500–7800)
Neutrophils Relative %: 61.4 %
Platelets: 257 10*3/uL (ref 140–400)
RBC: 4.84 10*6/uL (ref 4.20–5.80)
RDW: 13.2 % (ref 11.0–15.0)
Total Lymphocyte: 28 %
WBC: 9.6 10*3/uL (ref 3.8–10.8)

## 2022-02-09 LAB — COMPLETE METABOLIC PANEL WITH GFR
AG Ratio: 1.6 (calc) (ref 1.0–2.5)
ALT: 23 U/L (ref 9–46)
AST: 21 U/L (ref 10–35)
Albumin: 4.4 g/dL (ref 3.6–5.1)
Alkaline phosphatase (APISO): 38 U/L (ref 35–144)
BUN: 22 mg/dL (ref 7–25)
CO2: 26 mmol/L (ref 20–32)
Calcium: 10 mg/dL (ref 8.6–10.3)
Chloride: 104 mmol/L (ref 98–110)
Creat: 1.07 mg/dL (ref 0.70–1.35)
Globulin: 2.8 g/dL (calc) (ref 1.9–3.7)
Glucose, Bld: 81 mg/dL (ref 65–99)
Potassium: 3.9 mmol/L (ref 3.5–5.3)
Sodium: 140 mmol/L (ref 135–146)
Total Bilirubin: 0.4 mg/dL (ref 0.2–1.2)
Total Protein: 7.2 g/dL (ref 6.1–8.1)
eGFR: 76 mL/min/{1.73_m2} (ref >=60)

## 2022-02-09 LAB — FOLATE: Folate: 15.4 ng/mL

## 2022-02-09 LAB — VITAMIN B12: Vitamin B-12: 284 pg/mL (ref 200–1100)

## 2022-02-09 LAB — MAGNESIUM: Magnesium: 2.1 mg/dL (ref 1.5–2.5)

## 2022-02-09 LAB — HEMOGLOBIN A1C
Hgb A1c MFr Bld: 5.8 % of total Hgb — ABNORMAL HIGH (ref <5.7)
Mean Plasma Glucose: 120 mg/dL
eAG (mmol/L): 6.6 mmol/L

## 2022-02-09 LAB — VITAMIN B1: Vitamin B1 (Thiamine): 16 nmol/L (ref 8–30)

## 2022-02-09 NOTE — Patient Instructions (Addendum)
[place] Maintenance Summary and Written Plan of Care  [name] ,  Thank you for allowing me to perform your Medicare Annual Wellness Visit and for your ongoing commitment to your health.   Health Maintenance & Immunization History Health Maintenance  Topic Date Due  . DTaP/Tdap/Td (1 - Tdap) Never done  . COVID-19 Vaccine (5 - 2023-24 season) 04/07/2022 (Originally 11/05/2021)  . Zoster Vaccines- Shingrix (2 of 2) 05/05/2022 (Originally 10/12/2019)  . INFLUENZA VACCINE  06/05/2022 (Originally 10/05/2021)  . Hepatitis C Screening  02/04/2023 (Originally 02/11/1971)  . Pneumonia Vaccine 56+ Years old (1 - PCV) 02/10/2023 (Originally 02/11/1959)  . Medicare Annual Wellness (AWV)  02/10/2023  . Fecal DNA (Cologuard)  06/02/2023  . HPV VACCINES  Aged Out   Immunization History  Administered Date(s) Administered  . Fluad Quad(high Dose 65+) 11/15/2018  . Influenza-Unspecified 01/06/2020  . PFIZER(Purple Top)SARS-COV-2 Vaccination 04/27/2019, 05/21/2019, 12/07/2019, 09/30/2020  . Zoster Recombinat (Shingrix) 08/17/2019    These are the patient goals that we discussed:  Goals Addressed              This Visit's Progress   .  Patient Stated (pt-stated)        Patient would like to loose 20 lbs.        This is a list of Health Maintenance Items that are overdue or due now: Health Maintenance Due  Topic Date Due  . DTaP/Tdap/Td (1 - Tdap) Never done  Pneumococcal vaccine  Influenza vaccine Td vaccine Shingles vaccine - 2nd dose  Orders/Referrals Placed Today: No orders of the defined types were placed in this encounter.  (Contact our referral department at [phone] if you have not spoken with someone about your referral appointment within the next 5 days)    Follow-up Plan Follow-up with William Nutting, DO as planned Schedule influenza, td vaccine, pneumonia vaccine and shingles 2nd dose at the pharmacy. Medicare wellness visit in one  year. Patient will access AVS on my chart.      Health Maintenance, Male Adopting a healthy lifestyle and getting preventive care are important in promoting health and wellness. Ask your health care provider about: The right schedule for you to have regular tests and exams. Things you can do on your own to prevent diseases and keep yourself healthy. What should I know about diet, weight, and exercise? Eat a healthy diet  Eat a diet that includes plenty of vegetables, fruits, low-fat dairy products, and lean protein. Do not eat a lot of foods that are high in solid fats, added sugars, or sodium. Maintain a healthy weight Body mass index (BMI) is a measurement that can be used to identify possible weight problems. It estimates body fat based on height and weight. Your health care provider can help determine your BMI and help you achieve or maintain a healthy weight. Get regular exercise Get regular exercise. This is one of the most important things you can do for your health. Most adults should: Exercise for at least 150 minutes each week. The exercise should increase your heart rate and make you sweat (moderate-intensity exercise). Do strengthening exercises at least twice a week. This is in addition to the moderate-intensity exercise. Spend less time sitting. Even light physical activity can be beneficial. Watch cholesterol and blood lipids Have your blood tested for lipids and cholesterol at 69 years of age, then have this test every 5 years. You may need to have your cholesterol levels checked more often if: Your lipid  or cholesterol levels are high. You are older than 69 years of age. You are at high risk for heart disease. What should I know about cancer screening? Many types of cancers can be detected early and may often be prevented. Depending on your health history and family history, you may need to have cancer screening at various ages. This may include screening  for: Colorectal cancer. Prostate cancer. Skin cancer. Lung cancer. What should I know about heart disease, diabetes, and high blood pressure? Blood pressure and heart disease High blood pressure causes heart disease and increases the risk of stroke. This is more likely to develop in people who have high blood pressure readings or are overweight. Talk with your health care provider about your target blood pressure readings. Have your blood pressure checked: Every 3-5 years if you are 12-58 years of age. Every year if you are 73 years old or older. If you are between the ages of 55 and 52 and are a current or former smoker, ask your health care provider if you should have a one-time screening for abdominal aortic aneurysm (AAA). Diabetes Have regular diabetes screenings. This checks your fasting blood sugar level. Have the screening done: Once every three years after age 43 if you are at a normal weight and have a low risk for diabetes. More often and at a younger age if you are overweight or have a high risk for diabetes. What should I know about preventing infection? Hepatitis B If you have a higher risk for hepatitis B, you should be screened for this virus. Talk with your health care provider to find out if you are at risk for hepatitis B infection. Hepatitis C Blood testing is recommended for: Everyone born from [birth year] through 1963. Anyone with known risk factors for hepatitis C. Sexually transmitted infections (STIs) You should be screened each year for STIs, including gonorrhea and chlamydia, if: You are sexually active and are younger than 69 years of age. You are older than 69 years of age and your health care provider tells you that you are at risk for this type of infection. Your sexual activity has changed since you were last screened, and you are at increased risk for chlamydia or gonorrhea. Ask your health care provider if you are at risk. Ask your health care provider about  whether you are at high risk for HIV. Your health care provider may recommend a prescription medicine to help prevent HIV infection. If you choose to take medicine to prevent HIV, you should first get tested for HIV. You should then be tested every 3 months for as long as you are taking the medicine. Follow these instructions at home: Alcohol use Do not drink alcohol if your health care provider tells you not to drink. If you drink alcohol: Limit how much you have to 0-2 drinks a day. Know how much alcohol is in your drink. In the [place] one drink equals one 12 oz bottle of beer (355 mL), one 5 oz glass of wine (148 mL), or one 1 oz glass of hard liquor (44 mL). Lifestyle Do not use any products that contain nicotine or tobacco. These products include cigarettes, chewing tobacco, and vaping devices, such as e-cigarettes. If you need help quitting, ask your health care provider. Do not use street drugs. Do not share needles. Ask your health care provider for help if you need support or information about quitting drugs. General instructions Schedule regular health, dental, and eye exams. Stay current with  your vaccines. Tell your health care provider if: You often feel depressed. You have ever been abused or do not feel safe at home. Summary Adopting a healthy lifestyle and getting preventive care are important in promoting health and wellness. Follow your health care provider's instructions about healthy diet, exercising, and getting tested or screened for diseases. Follow your health care provider's instructions on monitoring your cholesterol and blood pressure. This information is not intended to replace advice given to you by your health care provider. Make sure you discuss any questions you have with your health care provider. Document Revised: 07/13/2020 Document Reviewed: 07/13/2020 Elsevier Patient Education  [place].

## 2022-02-09 NOTE — Progress Notes (Signed)
MEDICARE ANNUAL WELLNESS VISIT  02/09/2022  Telephone Visit Disclaimer This Medicare AWV was conducted by telephone due to national recommendations for restrictions regarding the COVID-19 Pandemic (e.g. social distancing).  I verified, using two identifiers, that I am speaking with William Wheeler or their authorized healthcare agent. I discussed the limitations, risks, security, and privacy concerns of performing an evaluation and management service by telephone and the potential availability of an in-person appointment in the future. The patient expressed understanding and agreed to proceed.  Location of Patient: Home Location of Provider (nurse):  In the office  Subjective:    William Wheeler is a 71 y.o. male patient of Luetta Nutting, DO who had a Medicare Annual Wellness Visit today via telephone. William Wheeler is Working full time and lives with their family. he has 2 children. he reports that he is socially active and does interact with friends/family regularly. he is minimally physically active and enjoys working and photography.  Patient Care Team: Luetta Nutting, DO as PCP - General (Family Medicine)     02/09/2022    9:36 AM  Advanced Directives  Does Patient Have a Medical Advance Directive? No  Would patient like information on creating a medical advance directive? No - Patient declined    Hospital Utilization Over the Past 12 Months: # of hospitalizations or ER visits: 1 # of surgeries: 1  Review of Systems    Patient reports that his overall health is unchanged compared to last year.  History obtained from chart review and the patient  Patient Reported Readings (BP, Pulse, CBG, Weight, etc) none  Pain Assessment Pain : No/denies pain     Current Medications & Allergies (verified) Allergies as of 02/09/2022   No Known Allergies      Medication List        Accurate as of February 09, 2022  9:49 AM. If you have any questions, ask your nurse or doctor.           acetaminophen 325 MG tablet Commonly known as: TYLENOL Take 650 mg by mouth every 6 (six) hours as needed.   atorvastatin 40 MG tablet Commonly known as: LIPITOR TAKE 1 TABLET BY MOUTH EVERY DAY   clopidogrel 75 MG tablet Commonly known as: PLAVIX TAKE 1 TABLET BY MOUTH EVERY DAY   Flonase Sensimist 27.5 MCG/SPRAY nasal spray Generic drug: fluticasone Place 2 sprays into the nose daily.   losartan-hydrochlorothiazide 50-12.5 MG tablet Commonly known as: HYZAAR TAKE 1 TABLET BY MOUTH EVERY DAY   valACYclovir 1000 MG tablet Commonly known as: VALTREX Take 1 tablet (1,000 mg total) by mouth 3 (three) times daily for 7 days.        History (reviewed): Past Medical History:  Diagnosis Date   Alcohol abuse    Alcohol addiction ([place])    Colon polyps    Diverticulitis    Diverticulitis    Hypercholesteremia    Hypertension    Skin cancer    Stroke [facility])    Past Surgical History:  Procedure Laterality Date   broken bone repair     Nose   HERNIA REPAIR     skin cancer removed  10/2021   forehead   Family History  Problem Relation Age of Onset   Leukemia Mother    Non-Hodgkin's lymphoma Mother    Other Father        unsure of medical history   Social History   Socioeconomic History   Marital status: Married    Spouse name:  Wallis Mart   Number of children: 2   Years of education: some college   Highest education level: Some college, no degree  Occupational History   Occupation: Engineer, site  Tobacco Use   Smoking status: Former    Packs/day: 2.50    Years: 29.00    Total pack years: 72.50    Types: Cigarettes    Quit date: 2001    Years since quitting: 22.9   Smokeless tobacco: Never  Vaping Use   Vaping Use: Never used  Substance and Sexual Activity   Alcohol use: Not Currently   Drug use: Not Currently    Types: Marijuana   Sexual activity: Not Currently    Partners: Female  Other Topics Concern   Not on file  Social  History Narrative   Lives at home with his wife and two children. He enjoys working and Scientist, water quality.   Social Determinants of Health   Financial Resource Strain: Low Risk  (02/09/2022)   Overall Financial Resource Strain (CARDIA)    Difficulty of Paying Living Expenses: Not hard at all  Food Insecurity: No Food Insecurity (02/09/2022)   Hunger Vital Sign    Worried About Running Out of Food in the Last Year: Never true    Ran Out of Food in the Last Year: Never true  Transportation Needs: No Transportation Needs (02/09/2022)   PRAPARE - Hydrologist (Medical): No    Lack of Transportation (Non-Medical): No  Physical Activity: Inactive (02/09/2022)   Exercise Vital Sign    Days of Exercise per Week: 0 days    Minutes of Exercise per Session: 0 min  Stress: No Stress Concern Present (02/09/2022)   [place]    Feeling of Stress : Only a little  Social Connections: Moderately Integrated (02/09/2022)   Social Connection and Isolation Panel [NHANES]    Frequency of Communication with Friends and Family: More than three times a week    Frequency of Social Gatherings with Friends and Family: More than three times a week    Attends Religious Services: More than 4 times per year    Active Member of [employer] or Organizations: No    Attends Archivist Meetings: Never    Marital Status: Married    Activities of Daily Living    02/09/2022    9:38 AM  In your present state of health, do you have any difficulty performing the following activities:  Hearing? 0  Vision? 0  Difficulty concentrating or making decisions? 1  Comment sometimes  Walking or climbing stairs? 0  Dressing or bathing? 0  Doing errands, shopping? 0  Preparing Food and eating ? N  Using the Toilet? N  In the past six months, have you accidently leaked urine? N  Do you have problems with loss of bowel control? N  Managing  your Medications? N  Managing your Finances? N  Housekeeping or managing your Housekeeping? N    Patient Education/ Literacy How often do you need to have someone help you when you read instructions, pamphlets, or other written materials from your doctor or pharmacy?: 1 - Never What is the last grade level you completed in school?: some college  Exercise Current Exercise Habits: The patient does not participate in regular exercise at present, Exercise limited by: None identified  Diet Patient reports consuming 2 meals a day and 2 snack(s) a day Patient reports that his primary diet is:  Regular Patient reports that she does have regular access to food.   Depression Screen    02/09/2022    9:37 AM 02/03/2022    3:13 PM 04/01/2021    9:41 AM 07/02/2019    7:43 AM 11/01/2018    2:57 PM  PHQ 2/9 Scores  PHQ - 2 Score 0 0 2 0 1  PHQ- 9 Score   '8 2 8     '$ Fall Risk    02/09/2022    9:36 AM 02/03/2022    3:13 PM 04/01/2021    9:41 AM 07/02/2019    7:43 AM  Fall Risk   Falls in the past year? 0 0 0 0  Number falls in past yr: 0 0 0 0  Injury with Fall? 0 0 0 0  Risk for fall due to : No Fall Risks No Fall Risks No Fall Risks   Follow up Falls evaluation completed Falls evaluation completed Falls evaluation completed      Objective:  William Wheeler seemed alert and oriented and he participated appropriately during our telephone visit.  Blood Pressure Weight BMI  BP Readings from Last 3 Encounters:  02/03/22 134/80  06/04/21 (!) 135/93  04/01/21 104/67   Wt Readings from Last 3 Encounters:  02/03/22 234 lb (106.1 kg)  06/04/21 225 lb (102.1 kg)  04/01/21 225 lb (102.1 kg)   BMI Readings from Last 1 Encounters:  02/03/22 30.04 kg/m    *Unable to obtain current vital signs, weight, and BMI due to telephone visit type  Hearing/Vision  Gwyndolyn Saxon did not seem to have difficulty with hearing/understanding during the telephone conversation Reports that he has had a formal eye  exam by an eye care professional within the past year Reports that he has not had a formal hearing evaluation within the past year *Unable to fully assess hearing and vision during telephone visit type  Cognitive Function:    02/09/2022    9:42 AM  6CIT Screen  What Year? 0 points  What month? 0 points  What time? 0 points  Count back from 20 0 points  Months in reverse 0 points  Repeat phrase 0 points  Total Score 0 points   (Normal:0-7, Significant for Dysfunction: >8)  Normal Cognitive Function Screening: Yes   Immunization & Health Maintenance Record Immunization History  Administered Date(s) Administered   Fluad Quad(high Dose 65+) 11/15/2018   Influenza-Unspecified 01/06/2020   PFIZER(Purple Top)SARS-COV-2 Vaccination 04/27/2019, 05/21/2019, 12/07/2019, 09/30/2020   Zoster Recombinat (Shingrix) 08/17/2019    Health Maintenance  Topic Date Due   DTaP/Tdap/Td (1 - Tdap) Never done   COVID-19 Vaccine (5 - 2023-24 season) 04/07/2022 (Originally 11/05/2021)   Zoster Vaccines- Shingrix (2 of 2) 05/05/2022 (Originally 10/12/2019)   INFLUENZA VACCINE  06/05/2022 (Originally 10/05/2021)   Hepatitis C Screening  02/04/2023 (Originally 02/11/1971)   Pneumonia Vaccine 44+ Years old (1 - PCV) 02/10/2023 (Originally 02/11/1959)   Medicare Annual Wellness (AWV)  02/10/2023   Fecal DNA (Cologuard)  06/02/2023   HPV VACCINES  Aged Out       Assessment  This is a routine wellness examination for [employer].  Health Maintenance: Due or Overdue Health Maintenance Due  Topic Date Due   DTaP/Tdap/Td (1 - Tdap) Never done    William Wheeler does not need a referral for Community Assistance: Care Management:   no Social Work:    no Prescription Assistance:  no Nutrition/Diabetes Education:  no   Plan:  Personalized Goals  Goals Addressed  This Visit's Progress     Patient Stated (pt-stated)        Patient would like to loose 20 lbs.       Personalized  Health Maintenance & Screening Recommendations  Pneumococcal vaccine  Influenza vaccine Td vaccine Shingles vaccine - 2nd dose  Lung Cancer Screening Recommended: no (Low Dose CT Chest recommended if Age 33-80 years, 20 pack-year currently smoking OR have quit w/in past 15 years) Hepatitis C Screening recommended: yes HIV Screening recommended: no  Advanced Directives: Written information was not prepared per patient's request.  Referrals & Orders No orders of the defined types were placed in this encounter.   Follow-up Plan Follow-up with Luetta Nutting, DO as planned Schedule influenza, td vaccine, pneumonia vaccine and shingles 2nd dose at the pharmacy. Medicare wellness visit in one year. Patient will access AVS on my chart.   I have personally reviewed and noted the following in the patient's chart:   Medical and social history Use of alcohol, tobacco or illicit drugs  Current medications and supplements Functional ability and status Nutritional status Physical activity Advanced directives List of other physicians Hospitalizations, surgeries, and ER visits in previous 12 months Vitals Screenings to include cognitive, depression, and falls Referrals and appointments  In addition, I have reviewed and discussed with William Wheeler certain preventive protocols, quality metrics, and best practice recommendations. A written personalized care plan for preventive services as well as general preventive health recommendations is available and can be mailed to the patient at his request.      Tinnie Gens, RN BSN  02/09/2022

## 2022-02-11 ENCOUNTER — Other Ambulatory Visit (HOSPITAL_COMMUNITY): Payer: Self-pay

## 2022-02-11 DIAGNOSIS — R059 Cough, unspecified: Secondary | ICD-10-CM

## 2022-02-11 DIAGNOSIS — R1310 Dysphagia, unspecified: Secondary | ICD-10-CM

## 2022-02-24 ENCOUNTER — Encounter (HOSPITAL_COMMUNITY): Payer: Medicare Other

## 2022-02-24 ENCOUNTER — Ambulatory Visit (HOSPITAL_COMMUNITY): Payer: Medicare Other

## 2022-02-26 DIAGNOSIS — Z23 Encounter for immunization: Secondary | ICD-10-CM | POA: Diagnosis not present

## 2022-03-10 ENCOUNTER — Encounter: Payer: Self-pay | Admitting: Podiatry

## 2022-03-10 ENCOUNTER — Ambulatory Visit (INDEPENDENT_AMBULATORY_CARE_PROVIDER_SITE_OTHER): Payer: Medicare HMO | Admitting: Podiatry

## 2022-03-10 DIAGNOSIS — L853 Xerosis cutis: Secondary | ICD-10-CM

## 2022-03-10 DIAGNOSIS — L600 Ingrowing nail: Secondary | ICD-10-CM

## 2022-03-10 NOTE — Progress Notes (Signed)
  Subjective:  Patient ID: William Wheeler, male    DOB: May 05, 1950,   MRN: [id number]  Chief Complaint  Patient presents with   Nail Problem     Foot exam    Ingrown Toenail    Right great toe possible ingrown     72 y.o. male presents for concern of bilateral feet requesting a foot exam and concern for a possible right great ingrown toenail. States he has been dealing with some dry skin as well as his right great toenail lateral border has been irritating at times and has been trimming and taking care of it. Looking for advice on this and more information on a possible procedure.  . Denies any other pedal complaints. Denies n/v/f/c.   Past Medical History:  Diagnosis Date   Alcohol abuse    Alcohol addiction ([place])    Colon polyps    Diverticulitis    Diverticulitis    Hypercholesteremia    Hypertension    Skin cancer    Stroke ([place])     Objective:  Physical Exam: Vascular: DP/PT pulses 2/4 bilateral. CFT <3 seconds. Normal hair growth on digits. No edema.  Skin. No lacerations or abrasions bilateral feet. Xerosis noted to plantar feet bialteral. Incurvation of lateral border or right hallux. No erythema edema or purulence noted.  Musculoskeletal: MMT 5/5 bilateral lower extremities in DF, PF, Inversion and Eversion. Deceased ROM in DF of ankle joint.  Neurological: Sensation intact to light touch.   Assessment:   1. Ingrown right greater toenail   2. Xerosis of skin      Plan:  Patient was evaluated and treated and all questions answered. Discussed ingrown toenails and treatment options.  Discussed xerosis and treatment options  Discussed using pumice stone and moisturizing feet.  Discussed trimming nails and discussed in depth procedure for ingrown nails. At this time will continue with soaks and trimming and discussed trying floss to help the nail. If ever worsens or will like to try the procedure he will call.   Lorenda Peck, DPM

## 2022-03-17 ENCOUNTER — Ambulatory Visit (HOSPITAL_COMMUNITY)
Admission: RE | Admit: 2022-03-17 | Discharge: 2022-03-17 | Disposition: A | Discharge status: Home / Self Care | Payer: Medicare HMO | Source: Ambulatory Visit | Attending: Family Medicine | Admitting: Family Medicine

## 2022-03-17 DIAGNOSIS — R1310 Dysphagia, unspecified: Secondary | ICD-10-CM | POA: Diagnosis not present

## 2022-03-17 DIAGNOSIS — R059 Cough, unspecified: Secondary | ICD-10-CM

## 2022-03-17 DIAGNOSIS — K219 Gastro-esophageal reflux disease without esophagitis: Secondary | ICD-10-CM | POA: Insufficient documentation

## 2022-03-17 DIAGNOSIS — R1312 Dysphagia, oropharyngeal phase: Secondary | ICD-10-CM

## 2022-03-17 NOTE — Progress Notes (Signed)
Modified Barium Swallow Progress Note  Patient Details  Name: William Wheeler MRN: [id number] Date of Birth: 1950/05/13  Today's Date: 03/17/2022  Modified Barium Swallow completed.  Full report located under Chart Review in the Imaging Section.  Brief recommendations include the following:  Clinical Impression  Pt presents with no swallowing impairment. Esophageal sweep WNL. No recommendations at this time.   Swallow Evaluation Recommendations       SLP Diet Recommendations: Regular solids;Thin liquid   Liquid Administration via: Cup;Straw   Medication Administration: Whole meds with liquid   Supervision: Patient able to self feed                  Herbie Baltimore, MA CCC-SLP  Acute Rehabilitation Services Secure Chat Preferred Office [phone]   Lynann Beaver 03/17/2022,11:58 AM

## 2022-06-07 ENCOUNTER — Other Ambulatory Visit: Payer: Self-pay | Admitting: Family Medicine

## 2022-06-07 NOTE — Telephone Encounter (Signed)
Patient is scheduled for 07/07/22,thanks

## 2022-06-07 NOTE — Telephone Encounter (Signed)
Pls contact the patient to schedule 6-mth HTN follow-up. Due in May. Thanks

## 2022-06-23 ENCOUNTER — Other Ambulatory Visit: Payer: Self-pay

## 2022-06-23 DIAGNOSIS — I639 Cerebral infarction, unspecified: Secondary | ICD-10-CM

## 2022-06-23 MED ORDER — CLOPIDOGREL BISULFATE 75 MG PO TABS: 75.0000 mg | ORAL_TABLET | Freq: Every day | ORAL | 3 refills | Status: DC

## 2022-07-07 ENCOUNTER — Ambulatory Visit (INDEPENDENT_AMBULATORY_CARE_PROVIDER_SITE_OTHER): Payer: Medicare HMO | Admitting: Family Medicine

## 2022-07-07 ENCOUNTER — Encounter: Payer: Self-pay | Admitting: Family Medicine

## 2022-07-07 VITALS — BP 114/70 | HR 56 | Ht 73.0 in | Wt 233.5 lb

## 2022-07-07 DIAGNOSIS — R4189 Other symptoms and signs involving cognitive functions and awareness: Secondary | ICD-10-CM | POA: Insufficient documentation

## 2022-07-07 DIAGNOSIS — R413 Other amnesia: Secondary | ICD-10-CM | POA: Diagnosis not present

## 2022-07-07 DIAGNOSIS — I10 Essential (primary) hypertension: Secondary | ICD-10-CM | POA: Diagnosis not present

## 2022-07-07 DIAGNOSIS — R454 Irritability and anger: Secondary | ICD-10-CM

## 2022-07-07 DIAGNOSIS — R350 Frequency of micturition: Secondary | ICD-10-CM

## 2022-07-07 DIAGNOSIS — R7303 Prediabetes: Secondary | ICD-10-CM | POA: Diagnosis not present

## 2022-07-07 DIAGNOSIS — I639 Cerebral infarction, unspecified: Secondary | ICD-10-CM

## 2022-07-07 MED ORDER — ESCITALOPRAM OXALATE 10 MG PO TABS: 10.0000 mg | ORAL_TABLET | Freq: Every day | ORAL | 1 refills | Status: DC

## 2022-07-07 NOTE — Assessment & Plan Note (Signed)
Continue plavix and statin

## 2022-07-07 NOTE — Assessment & Plan Note (Signed)
Checking PSA and A1c today.

## 2022-07-07 NOTE — Assessment & Plan Note (Signed)
He has had increased irritability related to stress and anxiety.  Adding lexapro and will plan to follow up in 4 weeks.

## 2022-07-07 NOTE — Assessment & Plan Note (Signed)
Possibly rleated ot stress and anxiety

## 2022-07-07 NOTE — Patient Instructions (Addendum)
Start lexapro (escitalopram) Continue all other medications.  See me again 1 month

## 2022-07-07 NOTE — Assessment & Plan Note (Signed)
BP is well controlled. Continue losartan/hctz.

## 2022-07-07 NOTE — Progress Notes (Signed)
William Wheeler - 72 y.o. male MRN [id number]  Date of birth: 1950/05/17  Subjective Chief Complaint  Patient presents with   Hypertension    6 mth HTN f/u    mood fluctuations    Patient c/o  frequent mood fluctuations and easily angered  and memory  issues- x slowly increased x last  2 to 3 years     HPI William Wheeler is a 72 y.o. male here today for follow up.   Continues on losartan/hctz for management of HTN.  BP is well controlled with medication at current strength.  He denies side effects.  He has not had chest pain, shortness of breath, palpitations, headache or vision changes.   History of CVA of frontal lobe.  He remains on plavix and statin.  He does admit to some mood changes as well as memory changes. He reports shortened temper and anger.  He has stress related to his business.  He is unsure if this worsened after his stroke.    Notes increased urinary frequency.  No trouble starting stream or pain with urination.    ROS:  A comprehensive ROS was completed and negative except as noted per HPI  No Known Allergies  Past Medical History:  Diagnosis Date   Alcohol abuse    Alcohol addiction (HCC)    Colon polyps    Diverticulitis    Diverticulitis    Hypercholesteremia    Hypertension    Skin cancer    Stroke [facility])     Past Surgical History:  Procedure Laterality Date   broken bone repair     Nose   HERNIA REPAIR     skin cancer removed  10/2021   forehead    Social History   Socioeconomic History   Marital status: Married    Spouse name: Knoah Nedeau   Number of children: 2   Years of education: some college   Highest education level: Some college, no degree  Occupational History   Occupation: Scientist, research (physical sciences)  Tobacco Use   Smoking status: Former    Packs/day: 2.50    Years: 29.00    Additional pack years: 0.00    Total pack years: 72.50    Types: Cigarettes    Quit date: 2001    Years since quitting: 23.3   Smokeless tobacco: Never   Vaping Use   Vaping Use: Never used  Substance and Sexual Activity   Alcohol use: Not Currently   Drug use: Not Currently    Types: Marijuana   Sexual activity: Not Currently    Partners: Female  Other Topics Concern   Not on file  Social History Narrative   Lives at home with his wife and two children. 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   Social Determinants of Health   Financial Resource Strain: Low Risk  (07/05/2022)   Overall Financial Resource Strain (CARDIA)    Difficulty of Paying Living Expenses: Not hard at all  Food Insecurity: No Food Insecurity (07/05/2022)   Hunger Vital Sign    Worried About Running Out of Food in the Last Year: Never true    Ran Out of Food in the Last Year: Never true  Transportation Needs: No Transportation Needs (07/05/2022)   PRAPARE - Administrator, Civil Service (Medical): No    Lack of Transportation (Non-Medical): No  Physical Activity: Inactive (07/05/2022)   Exercise Vital Sign    Days of Exercise per Week: 0 days    Minutes of Exercise  per Session: 0 min  Stress: Stress Concern Present (07/05/2022)   [employer] of Occupational Health - Occupational Stress Questionnaire    Feeling of Stress : Rather much  Social Connections: Moderately Isolated (07/05/2022)   Social Connection and Isolation Panel [NHANES]    Frequency of Communication with Friends and Family: Once a week    Frequency of Social Gatherings with Friends and Family: Never    Attends Religious Services: More than 4 times per year    Active Member of [employer] or Organizations: No    Attends Banker Meetings: Never    Marital Status: Married    Family History  Problem Relation Age of Onset   Leukemia Mother    Non-Hodgkin's lymphoma Mother    Other Father        unsure of medical history    Health Maintenance  Topic Date Due   DTaP/Tdap/Td (1 - Tdap) Never done   Zoster Vaccines- Shingrix (2 of 2) 01/07/2023 (Originally 10/12/2019)    COVID-19 Vaccine (5 - 2023-24 season) 01/23/2023 (Originally 11/05/2021)   Hepatitis C Screening  02/04/2023 (Originally 02/11/1971)   Pneumonia Vaccine 56+ Years old (1 of 2 - PCV) 02/10/2023 (Originally 02/11/1959)   INFLUENZA VACCINE  10/06/2022   Medicare Annual Wellness (AWV)  02/10/2023   Fecal DNA (Cologuard)  06/02/2023   HPV VACCINES  Aged Out     ----------------------------------------------------------------------------------------------------------------------------------------------------------------------------------------------------------------- Physical Exam BP 114/70   Pulse (!) 56   Ht 6\' 1"  (1.854 m)   Wt 233 lb 8 oz (105.9 kg)   SpO2 96%   BMI 30.81 kg/m   Physical Exam Constitutional:      Appearance: Normal appearance.  HENT:     Head: Normocephalic and atraumatic.  Eyes:     General: No scleral icterus. Cardiovascular:     Rate and Rhythm: Normal rate and regular rhythm.  Pulmonary:     Effort: Pulmonary effort is normal.     Breath sounds: Normal breath sounds.  Musculoskeletal:     Cervical back: Neck supple.  Neurological:     Mental Status: He is alert.  Psychiatric:        Mood and Affect: Mood normal.        Behavior: Behavior normal.     ------------------------------------------------------------------------------------------------------------------------------------------------------------------------------------------------------------------- Assessment and Plan  Essential hypertension BP is well controlled. Continue losartan/hctz.   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   Irritability He has had increased irritability related to stress and anxiety.  Adding lexapro and will plan to follow up in 4 weeks.   Frequent urination Checking PSA and A1c today.    Meds ordered this encounter  Medications   escitalopram (LEXAPRO) 10 MG tablet    Sig: Take 1 tablet (10 mg total) by  mouth daily. Start 1/2 tab (5mg ) x7 days then increase to full tab (10mg )    Dispense:  30 tablet    Refill:  1    Return in about 4 weeks (around 08/04/2022) for mood.    This visit occurred during the SARS-CoV-2 public health emergency.  Safety protocols were in place, including screening questions prior to the visit, additional usage of staff PPE, and extensive cleaning of exam room while observing appropriate contact time as indicated for disinfecting solutions.

## 2022-07-08 LAB — PSA: PSA: 0.94 ng/mL (ref <=4.00)

## 2022-07-08 LAB — COMPLETE METABOLIC PANEL WITH GFR
AG Ratio: 1.7 (calc) (ref 1.0–2.5)
ALT: 20 U/L (ref 9–46)
AST: 19 U/L (ref 10–35)
Albumin: 4.5 g/dL (ref 3.6–5.1)
Alkaline phosphatase (APISO): 39 U/L (ref 35–144)
BUN: 20 mg/dL (ref 7–25)
CO2: 27 mmol/L (ref 20–32)
Calcium: 10 mg/dL (ref 8.6–10.3)
Chloride: 104 mmol/L (ref 98–110)
Creat: 1.15 mg/dL (ref 0.70–1.35)
Globulin: 2.7 g/dL (calc) (ref 1.9–3.7)
Glucose, Bld: 96 mg/dL (ref 65–99)
Potassium: 4.2 mmol/L (ref 3.5–5.3)
Sodium: 140 mmol/L (ref 135–146)
Total Bilirubin: 0.6 mg/dL (ref 0.2–1.2)
Total Protein: 7.2 g/dL (ref 6.1–8.1)
eGFR: 69 mL/min/{1.73_m2} (ref >=60)

## 2022-07-08 LAB — TSH: TSH: 1.25 mIU/L (ref 0.40–4.50)

## 2022-07-08 LAB — HEMOGLOBIN A1C
Hgb A1c MFr Bld: 6.1 % of total Hgb — ABNORMAL HIGH (ref <5.7)
Mean Plasma Glucose: 128 mg/dL
eAG (mmol/L): 7.1 mmol/L

## 2022-07-08 LAB — VITAMIN B12: Vitamin B-12: 376 pg/mL (ref 200–1100)

## 2022-07-08 LAB — RPR: RPR Ser Ql: NONREACTIVE

## 2022-07-28 ENCOUNTER — Encounter: Payer: Self-pay | Admitting: Podiatry

## 2022-07-28 ENCOUNTER — Ambulatory Visit: Payer: Medicare HMO | Admitting: Podiatry

## 2022-07-28 DIAGNOSIS — L600 Ingrowing nail: Secondary | ICD-10-CM

## 2022-07-28 NOTE — Patient Instructions (Signed)

## 2022-07-28 NOTE — Progress Notes (Signed)
  Subjective:  Patient ID: William Wheeler, male    DOB: [birth year],   MRN: [id number]  Chief Complaint  Patient presents with   Nail Problem    Ingrown nail    72 y.o. male presents for concern this time of his left great toenail. Relates several weeks ago he clipped it and started to get infected and has been sore and draining. Ready to have procedure to remove.  . Denies any other pedal complaints. Denies n/v/f/c.   Past Medical History:  Diagnosis Date   Alcohol abuse    Alcohol addiction (HCC)    Colon polyps    Diverticulitis    Diverticulitis    Hypercholesteremia    Hypertension    Skin cancer    Stroke (HCC)     Objective:  Physical Exam: Vascular: DP/PT pulses 2/4 bilateral. CFT <3 seconds. Normal hair growth on digits. No edema.  Skin. No lacerations or abrasions bilateral feet. Left great toenail lateral border incurvated. Mild edema and erythema noted no purulence noted. Tender to palpation.  Musculoskeletal: MMT 5/5 bilateral lower extremities in DF, PF, Inversion and Eversion. Deceased ROM in DF of ankle joint.  Neurological: Sensation intact to light touch.   Assessment:   1. Ingrown left greater toenail      Plan:  Patient was evaluated and treated and all questions answered. Discussed ingrown toenails etiology and treatment options including procedure for removal vs conservative care.  Patient requesting removal of ingrown nail today. Procedure below.  Discussed procedure and post procedure care and patient expressed understanding.  Will follow-up in 2 weeks for nail check or sooner if any problems arise.    Procedure:  Procedure: partial Nail Avulsion of left hallux latearl nail border.  Surgeon: Louann Sjogren, DPM  Pre-op Dx: Ingrown toenail with infection Post-op: Same  Place of Surgery: Office exam room.  Indications for surgery: Painful and ingrown toenail.    The patient is requesting removal of nail with chemical matrixectomy. Risks and  complications were discussed with the patient for which they understand and written consent was obtained. Under sterile conditions a total of 3 mL of  1% lidocaine plain was infiltrated in a hallux block fashion. Once anesthetized, the skin was prepped in sterile fashion. A tourniquet was then applied. Next the lateral aspect of hallux nail border was then sharply excised making sure to remove the entire offending nail border.  Next phenol was then applied under standard conditions and copiously irrigated. Silvadene was applied. A dry sterile dressing was applied. After application of the dressing the tourniquet was removed and there is found to be an immediate capillary refill time to the digit. The patient tolerated the procedure well without any complications. Post procedure instructions were discussed the patient for which he verbally understood. Follow-up in two weeks for nail check or sooner if any problems are to arise. Discussed signs/symptoms of infection and directed to call the office immediately should any occur or go directly to the emergency room. In the meantime, encouraged to call the office with any questions, concerns, changes symptoms.   Louann Sjogren, DPM

## 2022-07-29 ENCOUNTER — Other Ambulatory Visit: Payer: Self-pay | Admitting: Family Medicine

## 2022-07-29 DIAGNOSIS — R454 Irritability and anger: Secondary | ICD-10-CM

## 2022-08-04 ENCOUNTER — Encounter: Payer: Self-pay | Admitting: Family Medicine

## 2022-08-04 ENCOUNTER — Ambulatory Visit (INDEPENDENT_AMBULATORY_CARE_PROVIDER_SITE_OTHER): Payer: Medicare HMO | Admitting: Family Medicine

## 2022-08-04 VITALS — BP 134/86 | HR 54 | Ht 73.0 in | Wt 232.0 lb

## 2022-08-04 DIAGNOSIS — I10 Essential (primary) hypertension: Secondary | ICD-10-CM

## 2022-08-04 DIAGNOSIS — R454 Irritability and anger: Secondary | ICD-10-CM

## 2022-08-04 NOTE — Assessment & Plan Note (Signed)
BP remains well controlled.  Continue losartan/hctz at current strength.   

## 2022-08-04 NOTE — Assessment & Plan Note (Signed)
Lexapro seems to be effective for him.  Will continue at current strength.

## 2022-08-04 NOTE — Progress Notes (Signed)
William Wheeler - 72 y.o. male MRN [id number]  Date of birth: 11-Sep-1950  Subjective Chief Complaint  Patient presents with   Follow-up    HPI William Wheeler is a 72 y.o. male here today for follow up.   Lexapro started at previous visit due to increased stress and irritability.  Tolerating well so far and reports that he has noted less irritability.  No significant side effects noted.  He would like to continue this.   BP was a little high on initial check today.  Repeat reading is much better. Doing well with current medication   ROS:  A comprehensive ROS was completed and negative except as noted per HPI  No Known Allergies  Past Medical History:  Diagnosis Date   Alcohol abuse    Alcohol addiction (HCC)    Colon polyps    Diverticulitis    Diverticulitis    Hypercholesteremia    Hypertension    Skin cancer    Stroke [facility])     Past Surgical History:  Procedure Laterality Date   broken bone repair     Nose   HERNIA REPAIR     skin cancer removed  10/2021   forehead    Social History   Socioeconomic History   Marital status: Married    Spouse name: Easton Pfuhl   Number of children: 2   Years of education: some college   Highest education level: Some college, no degree  Occupational History   Occupation: Scientist, research (physical sciences)  Tobacco Use   Smoking status: Former    Packs/day: 2.50    Years: 29.00    Additional pack years: 0.00    Total pack years: 72.50    Types: Cigarettes    Quit date: 2001    Years since quitting: 23.4   Smokeless tobacco: Never  Vaping Use   Vaping Use: Never used  Substance and Sexual Activity   Alcohol use: Not Currently   Drug use: Not Currently    Types: Marijuana   Sexual activity: Not Currently    Partners: Female  Other Topics Concern   Not on file  Social History Narrative   Lives at home with his wife and two children. 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   Social Determinants of Health   Financial Resource  Strain: Low Risk  (07/05/2022)   Overall Financial Resource Strain (CARDIA)    Difficulty of Paying Living Expenses: Not hard at all  Food Insecurity: No Food Insecurity (07/05/2022)   Hunger Vital Sign    Worried About Running Out of Food in the Last Year: Never true    Ran Out of Food in the Last Year: Never true  Transportation Needs: No Transportation Needs (07/05/2022)   PRAPARE - Administrator, Civil Service (Medical): No    Lack of Transportation (Non-Medical): No  Physical Activity: Inactive (07/05/2022)   Exercise Vital Sign    Days of Exercise per Week: 0 days    Minutes of Exercise per Session: 0 min  Stress: Stress Concern Present (07/05/2022)   [employer] of Occupational Health - Occupational Stress Questionnaire    Feeling of Stress : Rather much  Social Connections: Moderately Isolated (07/05/2022)   Social Connection and Isolation Panel [NHANES]    Frequency of Communication with Friends and Family: Once a week    Frequency of Social Gatherings with Friends and Family: Never    Attends Religious Services: More than 4 times per year    Active Member of  Clubs or Organizations: No    Attends Banker Meetings: Never    Marital Status: Married    Family History  Problem Relation Age of Onset   Leukemia Mother    Non-Hodgkin's lymphoma Mother    Other Father        unsure of medical history    Health Maintenance  Topic Date Due   DTaP/Tdap/Td (1 - Tdap) Never done   Zoster Vaccines- Shingrix (2 of 2) 01/07/2023 (Originally 10/12/2019)   COVID-19 Vaccine (5 - 2023-24 season) 01/23/2023 (Originally 11/05/2021)   Hepatitis C Screening  02/04/2023 (Originally 02/11/1971)   Pneumonia Vaccine 38+ Years old (1 of 2 - PCV) 02/10/2023 (Originally 02/11/1959)   INFLUENZA VACCINE  10/06/2022   Medicare Annual Wellness (AWV)  02/10/2023   Fecal DNA (Cologuard)  06/02/2023   HPV VACCINES  Aged Out   Colonoscopy  Discontinued      ----------------------------------------------------------------------------------------------------------------------------------------------------------------------------------------------------------------- Physical Exam BP 134/86   Pulse (!) 54   Ht 6\' 1"  (1.854 m)   Wt 232 lb (105.2 kg)   SpO2 99%   BMI 30.61 kg/m   Physical Exam Constitutional:      Appearance: Normal appearance.  HENT:     Head: Normocephalic and atraumatic.  Eyes:     General: No scleral icterus. Cardiovascular:     Rate and Rhythm: Normal rate and regular rhythm.  Pulmonary:     Effort: Pulmonary effort is normal.     Breath sounds: Normal breath sounds.  Neurological:     Mental Status: He is alert.  Psychiatric:        Mood and Affect: Mood normal.        Behavior: Behavior normal.     ------------------------------------------------------------------------------------------------------------------------------------------------------------------------------------------------------------------- Assessment and Plan  Essential hypertension BP remains well controlled.  Continue losartan/hctz at current strength.   Irritability Lexapro seems to be effective for him.  Will continue at current strength.     No orders of the defined types were placed in this encounter.   Return in about 6 months (around 02/04/2023) for F/u mood/HTN.    This visit occurred during the SARS-CoV-2 public health emergency.  Safety protocols were in place, including screening questions prior to the visit, additional usage of staff PPE, and extensive cleaning of exam room while observing appropriate contact time as indicated for disinfecting solutions.

## 2022-08-18 ENCOUNTER — Ambulatory Visit (INDEPENDENT_AMBULATORY_CARE_PROVIDER_SITE_OTHER): Payer: Medicare HMO | Admitting: Podiatry

## 2022-08-18 ENCOUNTER — Encounter: Payer: Self-pay | Admitting: Podiatry

## 2022-08-18 DIAGNOSIS — L600 Ingrowing nail: Secondary | ICD-10-CM | POA: Diagnosis not present

## 2022-08-18 DIAGNOSIS — L853 Xerosis cutis: Secondary | ICD-10-CM

## 2022-08-18 MED ORDER — AMMONIUM LACTATE 12 % EX CREA: 1.0000 | TOPICAL_CREAM | CUTANEOUS | 0 refills | Status: DC | PRN

## 2022-08-18 NOTE — Patient Instructions (Addendum)

## 2022-08-18 NOTE — Progress Notes (Signed)
  Subjective:  Patient ID: William Wheeler, male    DOB: 04-Apr-1950,   MRN: [id number]  Chief Complaint  Patient presents with   Nail Problem    Left great toe ingrown doing much better , patient wants to go ahead and do the right great toenail    72 y.o. male presents for folow-up of left ingrown toenail and now relates ready to have the right side done. Also relates some dry skin on feet. . Denies any other pedal complaints. Denies n/v/f/c.   Past Medical History:  Diagnosis Date   Alcohol abuse    Alcohol addiction (HCC)    Colon polyps    Diverticulitis    Diverticulitis    Hypercholesteremia    Hypertension    Skin cancer    Stroke (HCC)     Objective:  Physical Exam: Vascular: DP/PT pulses 2/4 bilateral. CFT <3 seconds. Normal hair growth on digits. No edema.  Skin. No lacerations or abrasions bilateral feet. Left great toenail healeing well. Right incurvation of bilateral hallux nails with tenderness. No erythema edema or purulence noted. Scaling and cracking noted to lateral plantar feet.  Musculoskeletal: MMT 5/5 bilateral lower extremities in DF, PF, Inversion and Eversion. Deceased ROM in DF of ankle joint.  Neurological: Sensation intact to light touch.   Assessment:   1. Ingrown left greater toenail   2. Ingrown right greater toenail   3. Xerosis of skin      Plan:  Patient was evaluated and treated and all questions answered. Discussed ingrown toenails etiology and treatment options including procedure for removal vs conservative care.  Patient requesting removal of ingrown nail today. Procedure below.  Discussed procedure and post procedure care and patient expressed understanding.  Amlactin sent to pharmacy  Will follow-up in 2 weeks for nail check or sooner if any problems arise.    Procedure:  Procedure: partial Nail Avulsion of right hallux bilateral nail border.  Surgeon: Louann Sjogren, DPM  Pre-op Dx: Ingrown toenail with infection Post-op: Same   Place of Surgery: Office exam room.  Indications for surgery: Painful and ingrown toenail.    The patient is requesting removal of nail with chemical matrixectomy. Risks and complications were discussed with the patient for which they understand and written consent was obtained. Under sterile conditions a total of 3 mL of  1% lidocaine plain was infiltrated in a hallux block fashion. Once anesthetized, the skin was prepped in sterile fashion. A tourniquet was then applied. Next the bilateral aspect of hallux nail border was then sharply excised making sure to remove the entire offending nail border.  Next phenol was then applied under standard conditions and copiously irrigated. Silvadene was applied. A dry sterile dressing was applied. After application of the dressing the tourniquet was removed and there is found to be an immediate capillary refill time to the digit. The patient tolerated the procedure well without any complications. Post procedure instructions were discussed the patient for which he verbally understood. Follow-up in two weeks for nail check or sooner if any problems are to arise. Discussed signs/symptoms of infection and directed to call the office immediately should any occur or go directly to the emergency room. In the meantime, encouraged to call the office with any questions, concerns, changes symptoms.   Louann Sjogren, DPM

## 2022-08-25 DIAGNOSIS — H524 Presbyopia: Secondary | ICD-10-CM | POA: Diagnosis not present

## 2022-08-25 DIAGNOSIS — H2513 Age-related nuclear cataract, bilateral: Secondary | ICD-10-CM | POA: Diagnosis not present

## 2022-08-25 DIAGNOSIS — H04123 Dry eye syndrome of bilateral lacrimal glands: Secondary | ICD-10-CM | POA: Diagnosis not present

## 2022-09-02 ENCOUNTER — Ambulatory Visit: Payer: Medicare HMO | Admitting: Podiatry

## 2022-09-02 ENCOUNTER — Encounter: Payer: Self-pay | Admitting: Podiatry

## 2022-09-02 DIAGNOSIS — L600 Ingrowing nail: Secondary | ICD-10-CM

## 2022-09-02 NOTE — Progress Notes (Signed)
  Subjective:  Patient ID: William Wheeler, male    DOB: [birth year],   MRN: [id number]  Chief Complaint  Patient presents with   Nail Problem     Nail check up     72 y.o. male presents for follow-up of right great toenail procedure and previous left great toenail procedure. Relates doing well and has stopped soaking.  . Denies any other pedal complaints. Denies n/v/f/c.   Past Medical History:  Diagnosis Date   Alcohol abuse    Alcohol addiction (HCC)    Colon polyps    Diverticulitis    Diverticulitis    Hypercholesteremia    Hypertension    Skin cancer    Stroke (HCC)     Objective:  Physical Exam: Vascular: DP/PT pulses 2/4 bilateral. CFT <3 seconds. Normal hair growth on digits. No edema.  Skin. No lacerations or abrasions bilateral feet. Bilateral hallux nails well healed.  Musculoskeletal: MMT 5/5 bilateral lower extremities in DF, PF, Inversion and Eversion. Deceased ROM in DF of ankle joint.  Neurological: Sensation intact to light touch.   Assessment:   1. Ingrown right greater toenail   2. Ingrown left greater toenail      Plan:  Patient was evaluated and treated and all questions answered. Toe was evaluated and appears to be healing well.  May discontinue soaks and neosporin.  Patient to follow-up as needed.    Louann Sjogren, DPM

## 2022-10-03 ENCOUNTER — Other Ambulatory Visit: Payer: Self-pay

## 2022-10-03 DIAGNOSIS — G459 Transient cerebral ischemic attack, unspecified: Secondary | ICD-10-CM

## 2022-10-03 DIAGNOSIS — I10 Essential (primary) hypertension: Secondary | ICD-10-CM

## 2022-10-03 MED ORDER — LOSARTAN POTASSIUM-HCTZ 50-12.5 MG PO TABS: 1.0000 | ORAL_TABLET | Freq: Every day | ORAL | 1 refills | Status: DC

## 2022-10-03 MED ORDER — ATORVASTATIN CALCIUM 40 MG PO TABS: 40.0000 mg | ORAL_TABLET | Freq: Every day | ORAL | 3 refills | Status: DC

## 2022-10-24 ENCOUNTER — Encounter: Payer: Self-pay | Admitting: Emergency Medicine

## 2022-10-24 ENCOUNTER — Ambulatory Visit
Admission: EM | Admit: 2022-10-24 | Discharge: 2022-10-24 | Disposition: A | Discharge status: Home / Self Care | Payer: Medicare HMO | Attending: Family Medicine | Admitting: Family Medicine

## 2022-10-24 DIAGNOSIS — R1032 Left lower quadrant pain: Secondary | ICD-10-CM

## 2022-10-24 DIAGNOSIS — Z8719 Personal history of other diseases of the digestive system: Secondary | ICD-10-CM | POA: Diagnosis not present

## 2022-10-24 MED ORDER — AMOXICILLIN-POT CLAVULANATE 875-125 MG PO TABS: 1.0000 | ORAL_TABLET | Freq: Three times a day (TID) | ORAL | 0 refills | Status: AC

## 2022-10-24 NOTE — ED Triage Notes (Signed)
Patient c/o his diverticulitis has flared up again.  He awoke this morning with LLQ pain.  Denies any nausea, vomiting or diarrhea.

## 2022-10-24 NOTE — Discharge Instructions (Addendum)
Instructed patient to take medication as directed with food to completion.  Encouraged increase daily water intake to 64 ounces per day while taking this medication.  Advised if symptoms worsen and/or unresolved please follow-up with PCP, GI or here for further evaluation.

## 2022-10-24 NOTE — ED Provider Notes (Signed)
[facility]    CSN: [id number] Arrival date & time: 10/24/22  1326      History   Chief Complaint Chief Complaint  Patient presents with   Abdominal Pain    HPI [name] is a 72 y.o. male.   HPI William Wheeler 72 year old male presents with what he believes is a diverticulitis flareup.  Patient has history of diverticulitis.  PMH significant for HTN, Ischemic CVA (currently on Plavix and denies any unusual bleeding), and alcohol abuse.  Patient was recently evaluated and treated here for similar symptoms on 06/04/2021 please see epic for that encounter note.  Past Medical History:  Diagnosis Date   Alcohol abuse    Alcohol addiction (HCC)    Colon polyps    Diverticulitis    Diverticulitis    Hypercholesteremia    Hypertension    Skin cancer    Stroke [facility])     Patient Active Problem List   Diagnosis Date Noted   Irritability 07/07/2022   Frequent urination 07/07/2022   Memory change 07/07/2022   Cracked skin on feet 02/06/2022   Facial skin lesion 02/06/2022   Oropharyngeal dysphagia 02/06/2022   Muscle cramping 02/06/2022   Left ear pain 02/06/2022   Arthralgia 04/01/2021   Chronic low back pain 03/11/2021   TIA (transient ischemic attack) 06/02/2020   Inguinal hernia 12/05/2019   History of CVA (cerebrovascular accident) without residual deficits 08/13/2019   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) 07/02/2019   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   Cervical stenosis of spinal canal 11/16/2018   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   Herpes zoster without complication 11/16/2018   Essential hypertension 11/01/2018   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/01/2018   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 11/01/2018   Former very heavy cigarette smoker (more than 40 per day) 11/01/2018   History of colon polyps 11/01/2018   History of colonic diverticulitis 11/01/2018   Paresthesia of left arm 11/01/2018   Nocturia 11/01/2018     Past Surgical History:  Procedure Laterality Date   broken bone repair     Nose   HERNIA REPAIR     skin cancer removed  10/2021   forehead       Home Medications    Prior to Admission medications   Medication Sig Start Date End Date Taking? Authorizing Provider  acetaminophen (TYLENOL) 325 MG tablet Take 650 mg by mouth every 6 (six) hours as needed.   Yes [provider]  ammonium lactate (AMLACTIN) 12 % cream Apply 1 Application topically as needed for dry skin. 08/18/22  Yes Louann Sjogren, DPM  amoxicillin-clavulanate (AUGMENTIN) 875-125 MG tablet Take 1 tablet by mouth in the morning, at noon, and at bedtime for 7 days. 10/24/22 10/31/22 Yes Trevor Iha, FNP  atorvastatin (LIPITOR) 40 MG tablet Take 1 tablet (40 mg total) by mouth daily. 10/03/22  Yes Everrett Coombe, DO  clopidogrel (PLAVIX) 75 MG tablet Take 1 tablet (75 mg total) by mouth daily. 06/23/22  Yes Everrett Coombe, DO  escitalopram (LEXAPRO) 10 MG tablet Take 1 tablet (10 mg total) by mouth daily. 08/03/22  Yes Everrett Coombe, DO  losartan-hydrochlorothiazide (HYZAAR) 50-12.5 MG tablet Take 1 tablet by mouth daily. 10/03/22  Yes Everrett Coombe, DO    Family History Family History  Problem Relation Age of Onset   Leukemia Mother    Non-Hodgkin's lymphoma Mother    Other Father        unsure of medical history  Social History Social History   Tobacco Use   Smoking status: Former    Current packs/day: 0.00    Average packs/day: 2.5 packs/day for 29.0 years (72.5 ttl pk-yrs)    Types: Cigarettes    Start date: 22    Quit date: 2001    Years since quitting: 23.6   Smokeless tobacco: Never  Vaping Use   Vaping status: Never Used  Substance Use Topics   Alcohol use: Not Currently   Drug use: Not Currently    Types: Marijuana     Allergies   Patient has no known allergies.   Review of Systems Review of Systems  Gastrointestinal:  Positive for abdominal pain.  All other systems  reviewed and are negative.    Physical Exam Triage Vital Signs ED Triage Vitals  Encounter Vitals Group     BP 10/24/22 1338 127/79     Systolic BP Percentile --      Diastolic BP Percentile --      Pulse Rate 10/24/22 1338 (!) 58     Resp 10/24/22 1338 18     Temp 10/24/22 1338 97.7 F (36.5 C)     Temp Source 10/24/22 1338 Oral     SpO2 10/24/22 1338 97 %     Weight 10/24/22 1340 225 lb (102.1 kg)     Height 10/24/22 1340 6\' 2"  (1.88 m)     Head Circumference --      Peak Flow --      Pain Score 10/24/22 1340 2     Pain Loc --      Pain Education --      Exclude from Growth Chart --    No data found.  Updated Vital Signs BP 127/79 (BP Location: Right Arm)   Pulse (!) 58   Temp 97.7 F (36.5 C) (Oral)   Resp 18   Ht 6\' 2"  (1.88 m)   Wt 225 lb (102.1 kg)   SpO2 97%   BMI 28.89 kg/m    Physical Exam Vitals and nursing note reviewed.  Constitutional:      Appearance: He is well-developed. He is obese.  HENT:     Head: Normocephalic and atraumatic.  Eyes:     Extraocular Movements: Extraocular movements intact.     Pupils: Pupils are equal, round, and reactive to light.  Cardiovascular:     Rate and Rhythm: Normal rate and regular rhythm.     Heart sounds: Normal heart sounds. No murmur heard. Pulmonary:     Effort: Pulmonary effort is normal.     Breath sounds: Normal breath sounds. No wheezing, rhonchi or rales.  Abdominal:     General: Abdomen is flat. Bowel sounds are normal. There is no distension or abdominal bruit. There are no signs of injury.     Palpations: There is no shifting dullness, fluid wave, hepatomegaly, splenomegaly, mass or pulsatile mass.     Tenderness: There is abdominal tenderness in the left lower quadrant. There is no right CVA tenderness, left CVA tenderness, guarding or rebound. Negative signs include Murphy's sign, Rovsing's sign, McBurney's sign and psoas sign.     Hernia: No hernia is present.  Skin:    General: Skin is warm  and dry.  Neurological:     General: No focal deficit present.     Mental Status: He is alert and oriented to person, place, and time.  Psychiatric:        Mood and Affect: Mood normal.  Behavior: Behavior normal.      UC Treatments / Results  Labs (all labs ordered are listed, but only abnormal results are displayed) Labs Reviewed - No data to display  EKG   Radiology No results found.  Procedures Procedures (including critical care time)  Medications Ordered in UC Medications - No data to display  Initial Impression / Assessment and Plan / UC Course  I have reviewed the triage vital signs and the nursing notes.  Pertinent labs & imaging results that were available during my care of the patient were reviewed by me and considered in my medical decision making (see chart for details).     MDM: 1.  Abdominal pain, left lower quadrant-Rx'd Augmentin 875/125 mg tablet: Take 1 tablet 3 times daily for the next 7 days.  Patient reports this medication worked well for him on previous acute diverticulitis flare of 06/04/2021.  2.  History of colonic diverticulitis-most previous flare today was 06/04/2021, Rx'd Augmentin 875/125 mg tablet: Take 1 tablet 3 times daily for the next 7 days. Instructed patient to take medication as directed with food to completion.  Encouraged increase daily water intake to 64 ounces per day while taking this medication.  Advised if symptoms worsen and/or unresolved please follow-up with PCP, GI or here for further evaluation.  Patient discharged home, hemodynamically stable.  Final Clinical Impressions(s) / UC Diagnoses   Final diagnoses:  Abdominal pain, left lower quadrant  History of colonic diverticulitis     Discharge Instructions      Instructed patient to take medication as directed with food to completion.  Encouraged increase daily water intake to 64 ounces per day while taking this medication.  Advised if symptoms worsen and/or  unresolved please follow-up with PCP, GI or here for further evaluation.     ED Prescriptions     Medication Sig Dispense Auth. Provider   amoxicillin-clavulanate (AUGMENTIN) 875-125 MG tablet Take 1 tablet by mouth in the morning, at noon, and at bedtime for 7 days. 21 tablet Trevor Iha, FNP      PDMP not reviewed this encounter.   Trevor Iha, FNP 10/24/22 1459

## 2022-11-01 ENCOUNTER — Ambulatory Visit
Admission: EM | Admit: 2022-11-01 | Discharge: 2022-11-01 | Disposition: A | Discharge status: Home / Self Care | Payer: Medicare HMO | Attending: Family Medicine | Admitting: Family Medicine

## 2022-11-01 ENCOUNTER — Emergency Department (HOSPITAL_BASED_OUTPATIENT_CLINIC_OR_DEPARTMENT_OTHER)
Admission: EM | Admit: 2022-11-01 | Discharge: 2022-11-01 | Disposition: A | Discharge status: Home / Self Care | Payer: Medicare HMO | Attending: Emergency Medicine | Admitting: Emergency Medicine

## 2022-11-01 ENCOUNTER — Other Ambulatory Visit: Payer: Self-pay

## 2022-11-01 ENCOUNTER — Emergency Department (HOSPITAL_BASED_OUTPATIENT_CLINIC_OR_DEPARTMENT_OTHER): Payer: Medicare HMO

## 2022-11-01 ENCOUNTER — Encounter (HOSPITAL_BASED_OUTPATIENT_CLINIC_OR_DEPARTMENT_OTHER): Payer: Self-pay | Admitting: Emergency Medicine

## 2022-11-01 ENCOUNTER — Encounter: Payer: Self-pay | Admitting: Emergency Medicine

## 2022-11-01 DIAGNOSIS — I10 Essential (primary) hypertension: Secondary | ICD-10-CM | POA: Insufficient documentation

## 2022-11-01 DIAGNOSIS — Z85828 Personal history of other malignant neoplasm of skin: Secondary | ICD-10-CM | POA: Insufficient documentation

## 2022-11-01 DIAGNOSIS — Z5321 Procedure and treatment not carried out due to patient leaving prior to being seen by health care provider: Secondary | ICD-10-CM | POA: Diagnosis not present

## 2022-11-01 DIAGNOSIS — K5732 Diverticulitis of large intestine without perforation or abscess without bleeding: Secondary | ICD-10-CM | POA: Diagnosis not present

## 2022-11-01 DIAGNOSIS — N281 Cyst of kidney, acquired: Secondary | ICD-10-CM | POA: Diagnosis not present

## 2022-11-01 DIAGNOSIS — K5792 Diverticulitis of intestine, part unspecified, without perforation or abscess without bleeding: Secondary | ICD-10-CM | POA: Insufficient documentation

## 2022-11-01 DIAGNOSIS — R6883 Chills (without fever): Secondary | ICD-10-CM | POA: Diagnosis not present

## 2022-11-01 DIAGNOSIS — N400 Enlarged prostate without lower urinary tract symptoms: Secondary | ICD-10-CM | POA: Diagnosis not present

## 2022-11-01 DIAGNOSIS — Z8673 Personal history of transient ischemic attack (TIA), and cerebral infarction without residual deficits: Secondary | ICD-10-CM | POA: Insufficient documentation

## 2022-11-01 DIAGNOSIS — Z79899 Other long term (current) drug therapy: Secondary | ICD-10-CM | POA: Insufficient documentation

## 2022-11-01 DIAGNOSIS — Z7902 Long term (current) use of antithrombotics/antiplatelets: Secondary | ICD-10-CM | POA: Diagnosis not present

## 2022-11-01 DIAGNOSIS — R1030 Lower abdominal pain, unspecified: Secondary | ICD-10-CM | POA: Diagnosis not present

## 2022-11-01 DIAGNOSIS — D72829 Elevated white blood cell count, unspecified: Secondary | ICD-10-CM | POA: Diagnosis not present

## 2022-11-01 DIAGNOSIS — R197 Diarrhea, unspecified: Secondary | ICD-10-CM | POA: Diagnosis not present

## 2022-11-01 DIAGNOSIS — R1032 Left lower quadrant pain: Secondary | ICD-10-CM

## 2022-11-01 DIAGNOSIS — K429 Umbilical hernia without obstruction or gangrene: Secondary | ICD-10-CM | POA: Diagnosis not present

## 2022-11-01 DIAGNOSIS — Z8719 Personal history of other diseases of the digestive system: Secondary | ICD-10-CM | POA: Diagnosis not present

## 2022-11-01 LAB — COMPREHENSIVE METABOLIC PANEL
ALT: 22 IU/L (ref 0–44)
AST: 38 IU/L (ref 0–40)
Albumin: 4.6 g/dL (ref 3.9–4.9)
Alkaline Phosphatase: 54 IU/L (ref 44–121)
BUN/Creatinine Ratio: 19 (ref 10–24)
BUN: 20 mg/dL (ref 8–27)
Bilirubin Total: 0.8 mg/dL (ref 0.0–1.2)
CO2: 23 mmol/L (ref 20–29)
Calcium: 9.7 mg/dL (ref 8.6–10.2)
Chloride: 102 mmol/L (ref 96–106)
Creatinine, Ser: 1.08 mg/dL (ref 0.76–1.27)
Globulin, Total: 2.8 g/dL (ref 1.5–4.5)
Glucose: 100 mg/dL — ABNORMAL HIGH (ref 70–99)
Potassium: 4.6 mmol/L (ref 3.5–5.2)
Sodium: 138 mmol/L (ref 134–144)
Total Protein: 7.4 g/dL (ref 6.0–8.5)
eGFR: 74 mL/min/{1.73_m2} (ref >59)

## 2022-11-01 LAB — CBC WITH DIFFERENTIAL/PLATELET
Basophils Absolute: 0 10*3/uL (ref 0.0–0.2)
Basos: 0 %
EOS (ABSOLUTE): 0 10*3/uL (ref 0.0–0.4)
Eos: 0 %
Hematocrit: 41.7 % (ref 37.5–51.0)
Hemoglobin: 13.7 g/dL (ref 13.0–17.7)
Immature Granulocytes: 0 %
Lymphocytes Absolute: 1.4 10*3/uL (ref 0.7–3.1)
Lymphs: 8 %
MCH: 29 pg (ref 26.6–33.0)
MCHC: 32.9 g/dL (ref 31.5–35.7)
MCV: 88 fL (ref 79–97)
Monocytes Absolute: 1.6 10*3/uL — ABNORMAL HIGH (ref 0.1–0.9)
Monocytes: 9 %
Neutrophils Absolute: 15 10*3/uL — ABNORMAL HIGH (ref 1.4–7.0)
Neutrophils: 83 %
Platelets: 272 10*3/uL (ref 150–450)
RBC: 4.72 x10E6/uL (ref 4.14–5.80)
RDW: 13.5 % (ref 11.6–15.4)
WBC: 18.1 10*3/uL — ABNORMAL HIGH (ref 3.4–10.8)

## 2022-11-01 LAB — POC SARS CORONAVIRUS 2 AG -  ED: SARS Coronavirus 2 Ag: NEGATIVE

## 2022-11-01 MED ORDER — CIPROFLOXACIN IN D5W 400 MG/200ML IV SOLN
400.0000 mg | Freq: Once | INTRAVENOUS | Status: AC
  Administered 2022-11-01: 400 mg via INTRAVENOUS
  Filled 2022-11-01: qty 200

## 2022-11-01 MED ORDER — IOHEXOL 300 MG/ML  SOLN
100.0000 mL | Freq: Once | INTRAMUSCULAR | Status: AC | PRN
  Administered 2022-11-01: 100 mL via INTRAVENOUS

## 2022-11-01 MED ORDER — METRONIDAZOLE 500 MG PO TABS: 500.0000 mg | ORAL_TABLET | Freq: Two times a day (BID) | ORAL | 0 refills | Status: DC

## 2022-11-01 MED ORDER — METRONIDAZOLE 500 MG/100ML IV SOLN
500.0000 mg | Freq: Once | INTRAVENOUS | Status: AC
  Administered 2022-11-01: 500 mg via INTRAVENOUS
  Filled 2022-11-01: qty 100

## 2022-11-01 MED ORDER — CIPROFLOXACIN HCL 500 MG PO TABS: 500.0000 mg | ORAL_TABLET | Freq: Two times a day (BID) | ORAL | 0 refills | Status: DC

## 2022-11-01 NOTE — Discharge Instructions (Addendum)
We think he would benefit from [facility] the hospital but we are giving a try with oral antibiotics by your choice.  If things worsen return to the ER.  You can return at any time.  Watch for more abdominal pain or difficulty eating.  Watch for fevers.

## 2022-11-01 NOTE — ED Notes (Signed)
Had CBC and chemistry done today  per chart , results on charts .

## 2022-11-01 NOTE — Discharge Instructions (Addendum)
Advised patient COVID-19 was negative.  Advised patient if left lower abdominal pain worsens please go to [facility] ED for further evaluation to include CT of abdomen pelvis with contrast.  Patient we will follow-up with lab results today, 11/01/2022 once received.  Advised patient white blood cells returned at 18 and for him to go to med center [facility] ED now for further evaluation of left lower quadrant pain assuming diverticulitis but high concern for septicemia.  Patient agreed and verbalized understanding of these instructions and this plan of care today.

## 2022-11-01 NOTE — ED Provider Notes (Signed)
[facility] Provider Note   CSN: [id number] Arrival date & time: 11/01/22  1728     History  Chief Complaint  Patient presents with   Abdominal Pain    LLQ    William Wheeler is a 72 y.o. male.   Abdominal Pain Patient abdominal pain.  History of diverticulitis.  10 days ago saw urgent care and was treated with Augmentin.  Continued pain worsening.  Went to urgent care and was sent to get CT.  White count elevated either 17 or 18.  No fevers.  Decreased oral intake.   Past Medical History:  Diagnosis Date   Alcohol abuse    Alcohol addiction (HCC)    Colon polyps    Diverticulitis    Diverticulitis    Hypercholesteremia    Hypertension    Skin cancer    Stroke [facility])     Home Medications Prior to Admission medications   Medication Sig Start Date End Date Taking? Authorizing Provider  ciprofloxacin (CIPRO) 500 MG tablet Take 1 tablet (500 mg total) by mouth 2 (two) times daily. 11/01/22  Yes Benjiman Core, MD  metroNIDAZOLE (FLAGYL) 500 MG tablet Take 1 tablet (500 mg total) by mouth 2 (two) times daily. 11/01/22  Yes Benjiman Core, MD  acetaminophen (TYLENOL) 325 MG tablet Take 650 mg by mouth every 6 (six) hours as needed.    [provider]  ammonium lactate (AMLACTIN) 12 % cream Apply 1 Application topically as needed for dry skin. 08/18/22   Louann Sjogren, DPM  atorvastatin (LIPITOR) 40 MG tablet Take 1 tablet (40 mg total) by mouth daily. 10/03/22   Everrett Coombe, DO  clopidogrel (PLAVIX) 75 MG tablet Take 1 tablet (75 mg total) by mouth daily. 06/23/22   Everrett Coombe, DO  escitalopram (LEXAPRO) 10 MG tablet Take 1 tablet (10 mg total) by mouth daily. 08/03/22   Everrett Coombe, DO  losartan-hydrochlorothiazide (HYZAAR) 50-12.5 MG tablet Take 1 tablet by mouth daily. 10/03/22   Everrett Coombe, DO      Allergies    Patient has no known allergies.    Review of Systems   Review of Systems   Gastrointestinal:  Positive for abdominal pain.    Physical Exam Updated Vital Signs BP 138/75   Pulse 78   Temp 98.4 F (36.9 C) (Oral)   Resp 18   Wt 102 kg   SpO2 98%   BMI 28.87 kg/m  Physical Exam Vitals and nursing note reviewed.  Abdominal:     Tenderness: There is abdominal tenderness.     Comments: Left lower quadrant tenderness rebound or guarding.  No hernia palpated.  Neurological:     Mental Status: He is alert.     ED Results / Procedures / Treatments   Labs (all labs ordered are listed, but only abnormal results are displayed) Labs Reviewed - No data to display  EKG None  Radiology CT ABDOMEN PELVIS W CONTRAST  Result Date: 11/01/2022 CLINICAL DATA:  Left lower quadrant abdominal pain. EXAM: CT ABDOMEN AND PELVIS WITH CONTRAST TECHNIQUE: Multidetector CT imaging of the abdomen and pelvis was performed using the standard protocol following bolus administration of intravenous contrast. RADIATION DOSE REDUCTION: This exam was performed according to the departmental dose-optimization program which includes automated exposure control, adjustment of the mA and/or kV according to patient size and/or use of iterative reconstruction technique. CONTRAST:  OMNIPAQUE IOHEXOL 300 MG/ML  SOLN COMPARISON:  None Available. FINDINGS: Lower chest: The visualized  lung bases are clear. There is coronary vascular calcification. No intra-abdominal free air or free fluid. Hepatobiliary: The liver is unremarkable. No biliary ductal dilatation. The gallbladder is unremarkable. Pancreas: Unremarkable. No pancreatic ductal dilatation or surrounding inflammatory changes. Spleen: Normal in size without focal abnormality. Adrenals/Urinary Tract: The adrenal glands are unremarkable. Small left renal upper pole cyst. There is no hydronephrosis on either side. There is symmetric enhancement and excretion of contrast by both kidneys. The visualized ureters and urinary bladder appear  unremarkable. Stomach/Bowel: There is sigmoid diverticulosis with muscular hypertrophy. There is diffuse perisigmoid haziness and stranding consistent with acute diverticulitis. No diverticular abscess or perforation. Additional scattered colonic diverticula. There is no bowel obstruction. The appendix is normal. Vascular/Lymphatic: Mild aortoiliac atherosclerotic disease. The IVC is unremarkable. No portal venous gas. There is no adenopathy. Reproductive: The prostate mildly enlarged prostate gland with median lobe hypertrophy indenting the base of the bladder. The seminal vesicles are symmetric. Other: Right inguinal hernia repair. Small fat containing left inguinal and umbilical hernias. Musculoskeletal: Mild degenerative changes. No acute osseous pathology. IMPRESSION: 1. Acute sigmoid diverticulitis. No diverticular abscess or perforation. 2.  Aortic Atherosclerosis (ICD10-I70.0). Electronically Signed   By: Elgie Collard M.D.   On: 11/01/2022 19:44    Procedures Procedures    Medications Ordered in ED Medications  iohexol (OMNIPAQUE) 300 MG/ML solution 100 mL (100 mLs Intravenous Contrast Given 11/01/22 1908)  ciprofloxacin (CIPRO) IVPB 400 mg (400 mg Intravenous New Bag/Given 11/01/22 2006)  metroNIDAZOLE (FLAGYL) IVPB 500 mg (500 mg Intravenous New Bag/Given 11/01/22 2011)    ED Course/ Medical Decision Making/ A&P                                 Medical Decision Making Amount and/or Complexity of Data Reviewed Radiology: ordered.  Risk Prescription drug management.   Patient has left lower quadrant pain with recent diverticulitis.  However no imaging previously done and just presumed diverticulitis.  However has failed outpatient antibiotics.  CT scan done shows uncomplicated diverticulitis.  However patient does not want to be admitted to the hospital.  For now we will give dose of Cipro and Flagyl here.  White count is elevated.  Patient still does not want to be admitted.   States he has family members coming in.  Will give a trial at home but was given Cipro and Flagyl here to hopefully help get more under control initially.  Discharge AMA.        Final Clinical Impression(s) / ED Diagnoses Final diagnoses:  Diverticulitis    Rx / DC Orders ED Discharge Orders          Ordered    ciprofloxacin (CIPRO) 500 MG tablet  2 times daily        11/01/22 2124    metroNIDAZOLE (FLAGYL) 500 MG tablet  2 times daily        11/01/22 2124              Benjiman Core, MD 11/01/22 2140

## 2022-11-01 NOTE — ED Triage Notes (Signed)
Patient states that his diverticulitis has flared up this morning.  Patient did not complete the regimen of medication as directed.  He should've been done by now but he has 4 pills remaining.

## 2022-11-01 NOTE — ED Triage Notes (Signed)
LLQ pain , just completed treatment for diverticulitis 10 days ago . Pain is worse . Reports fever . Denies dysuria . Diarrhea x 2 days .

## 2022-11-01 NOTE — ED Provider Notes (Signed)
[facility]    CSN: [id number] Arrival date & time: 11/01/22  1129      History   Chief Complaint Chief Complaint  Patient presents with   Diverticulitis    HPI William Wheeler is a 72 y.o. male.   HPI 72 year old male presents with continued diarrhea versus reticulitis flareup.  Patient has previous history of diverticulitis, stroke, HTN, and alcohol abuse.  Patient was evaluated by me on 10/24/2022 please see epic for that encounter note.  Patient is currently on Plavix and denies any unusual bleeding.  Past Medical History:  Diagnosis Date   Alcohol abuse    Alcohol addiction (HCC)    Colon polyps    Diverticulitis    Diverticulitis    Hypercholesteremia    Hypertension    Skin cancer    Stroke [facility])     Patient Active Problem List   Diagnosis Date Noted   Irritability 07/07/2022   Frequent urination 07/07/2022   Memory change 07/07/2022   Cracked skin on feet 02/06/2022   Facial skin lesion 02/06/2022   Oropharyngeal dysphagia 02/06/2022   Muscle cramping 02/06/2022   Left ear pain 02/06/2022   Arthralgia 04/01/2021   Chronic low back pain 03/11/2021   TIA (transient ischemic attack) 06/02/2020   Inguinal hernia 12/05/2019   History of CVA (cerebrovascular accident) without residual deficits 08/13/2019   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) 07/02/2019   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   Cervical stenosis of spinal canal 11/16/2018   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   Herpes zoster without complication 11/16/2018   Essential hypertension 11/01/2018   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/01/2018   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 11/01/2018   Former very heavy cigarette smoker (more than 40 per day) 11/01/2018   History of colon polyps 11/01/2018   History of colonic diverticulitis 11/01/2018   Paresthesia of left arm 11/01/2018   Nocturia 11/01/2018    Past Surgical History:  Procedure Laterality Date    broken bone repair     Nose   HERNIA REPAIR     skin cancer removed  10/2021   forehead       Home Medications    Prior to Admission medications   Medication Sig Start Date End Date Taking? Authorizing Provider  acetaminophen (TYLENOL) 325 MG tablet Take 650 mg by mouth every 6 (six) hours as needed.   Yes [provider]  ammonium lactate (AMLACTIN) 12 % cream Apply 1 Application topically as needed for dry skin. 08/18/22  Yes Louann Sjogren, DPM  atorvastatin (LIPITOR) 40 MG tablet Take 1 tablet (40 mg total) by mouth daily. 10/03/22  Yes Everrett Coombe, DO  clopidogrel (PLAVIX) 75 MG tablet Take 1 tablet (75 mg total) by mouth daily. 06/23/22  Yes Everrett Coombe, DO  escitalopram (LEXAPRO) 10 MG tablet Take 1 tablet (10 mg total) by mouth daily. 08/03/22  Yes Everrett Coombe, DO  losartan-hydrochlorothiazide (HYZAAR) 50-12.5 MG tablet Take 1 tablet by mouth daily. 10/03/22  Yes Everrett Coombe, DO    Family History Family History  Problem Relation Age of Onset   Leukemia Mother    Non-Hodgkin's lymphoma Mother    Other Father        unsure of medical history    Social History Social History   Tobacco Use   Smoking status: Former    Current packs/day: 0.00    Average packs/day: 2.5 packs/day for 29.0 years (72.5 ttl pk-yrs)    Types: Cigarettes  Start date: 16    Quit date: 2001    Years since quitting: 23.6   Smokeless tobacco: Never  Vaping Use   Vaping status: Never Used  Substance Use Topics   Alcohol use: Not Currently   Drug use: Not Currently    Types: Marijuana     Allergies   Patient has no known allergies.   Review of Systems Review of Systems  Gastrointestinal:  Positive for abdominal pain.       Loose stools for the past week     Physical Exam Triage Vital Signs ED Triage Vitals  Encounter Vitals Group     BP 11/01/22 1140 119/70     Systolic BP Percentile --      Diastolic BP Percentile --      Pulse Rate 11/01/22 1140 69      Resp 11/01/22 1140 18     Temp 11/01/22 1140 98.1 F (36.7 C)     Temp Source 11/01/22 1140 Oral     SpO2 11/01/22 1140 97 %     Weight 11/01/22 1142 225 lb 1.4 oz (102.1 kg)     Height 11/01/22 1142 6\' 2"  (1.88 m)     Head Circumference --      Peak Flow --      Pain Score --      Pain Loc --      Pain Education --      Exclude from Growth Chart --    No data found.  Updated Vital Signs BP 119/70 (BP Location: Right Arm)   Pulse 69   Temp 98.1 F (36.7 C) (Oral)   Resp 18   Ht 6\' 2"  (1.88 m)   Wt 225 lb 1.4 oz (102.1 kg)   SpO2 97%   BMI 28.90 kg/m     Physical Exam Vitals and nursing note reviewed.  Constitutional:      Appearance: Normal appearance. He is obese. He is ill-appearing.  HENT:     Head: Normocephalic and atraumatic.     Mouth/Throat:     Mouth: Mucous membranes are moist.     Pharynx: Oropharynx is clear.  Eyes:     Extraocular Movements: Extraocular movements intact.     Conjunctiva/sclera: Conjunctivae normal.     Pupils: Pupils are equal, round, and reactive to light.  Cardiovascular:     Rate and Rhythm: Normal rate and regular rhythm.     Pulses: Normal pulses.     Heart sounds: Normal heart sounds.  Pulmonary:     Effort: Pulmonary effort is normal.     Breath sounds: Normal breath sounds.  Abdominal:     General: Abdomen is flat. Bowel sounds are absent. There is no distension or abdominal bruit.     Palpations: Abdomen is soft. There is no mass.     Tenderness: There is abdominal tenderness in the left lower quadrant. There is no right CVA tenderness, left CVA tenderness or rebound. Negative signs include Murphy's sign, McBurney's sign, psoas sign and obturator sign.     Hernia: No hernia is present.    Musculoskeletal:        General: Normal range of motion.     Cervical back: Normal range of motion and neck supple.  Skin:    General: Skin is warm and dry.     Coloration: Skin is pale.  Neurological:     General: No focal  deficit present.     Mental Status: He is alert and oriented to  person, place, and time. Mental status is at baseline.      UC Treatments / Results  Labs (all labs ordered are listed, but only abnormal results are displayed) Labs Reviewed  CBC WITH DIFFERENTIAL/PLATELET  COMPREHENSIVE METABOLIC PANEL  POC SARS CORONAVIRUS 2 AG -  ED    EKG   Radiology No results found.  Procedures Procedures (including critical care time)  Medications Ordered in UC Medications - No data to display  Initial Impression / Assessment and Plan / UC Course  I have reviewed the triage vital signs and the nursing notes.  Pertinent labs & imaging results that were available during my care of the patient were reviewed by me and considered in my medical decision making (see chart for details).     MDM: 1.  Abdominal pain, left lower quadrant-concerning for diverticulitis.  Patient treated previously on 10/24/2022 by me.  Augmentin has worked in the past per patient and previous visit here of diverticulitis of colon on 06/04/2021.  Concerning for unresolved diverticulitis given patient's insurance unable to perform CT of abdomen and pelvis with oral and IV contrast.  CBC with differential, CMP ordered stat.  Advised patient we will follow-up with lab results once received. Advised patient COVID-19 was negative.  Called and spoke with patient at 3 PM this afternoon and prior to closing this chart advise WBC returned at 18 and that he needs to go to [facility] ED NOW for further evaluation to include CT of abdomen pelvis with contrast in order to quantify probable diverticulitis.  Advised patient I would send antibiotics to his pharmacy; however, imperative that he proceeds to emergency department now for imaging to rule out septicemia and possible perforation of colon.  2.  Chills-COVID-19 negative presumably from mild septicemia.  Patient agreed and verbalized understanding and reports he  will go to [facility] ED now for further evaluation. Final Clinical Impressions(s) / UC Diagnoses   Final diagnoses:  Abdominal pain, left lower quadrant  Chills     Discharge Instructions      Advised patient COVID-19 was negative.  Advised patient if left lower abdominal pain worsens please go to [facility] ED for further evaluation to include CT of abdomen pelvis with contrast.  Patient we will follow-up with lab results today, 11/01/2022 once received.     ED Prescriptions   None    PDMP not reviewed this encounter.   Trevor Iha, FNP 11/01/22 1515

## 2022-11-20 ENCOUNTER — Encounter (HOSPITAL_BASED_OUTPATIENT_CLINIC_OR_DEPARTMENT_OTHER): Payer: Self-pay

## 2022-11-20 ENCOUNTER — Inpatient Hospital Stay (HOSPITAL_BASED_OUTPATIENT_CLINIC_OR_DEPARTMENT_OTHER)
Admission: EM | Admit: 2022-11-20 | Discharge: 2022-11-22 | DRG: 392 | Disposition: A | Discharge status: Home / Self Care | Payer: Medicare HMO | Attending: Internal Medicine | Admitting: Internal Medicine

## 2022-11-20 ENCOUNTER — Other Ambulatory Visit: Payer: Self-pay

## 2022-11-20 ENCOUNTER — Emergency Department (HOSPITAL_BASED_OUTPATIENT_CLINIC_OR_DEPARTMENT_OTHER): Payer: Medicare HMO

## 2022-11-20 ENCOUNTER — Ambulatory Visit
Admission: EM | Admit: 2022-11-20 | Discharge: 2022-11-20 | Disposition: A | Discharge status: Home / Self Care | Payer: Medicare HMO | Attending: Family Medicine | Admitting: Family Medicine

## 2022-11-20 DIAGNOSIS — R100 Acute abdomen: Secondary | ICD-10-CM | POA: Diagnosis not present

## 2022-11-20 DIAGNOSIS — I10 Essential (primary) hypertension: Secondary | ICD-10-CM | POA: Diagnosis not present

## 2022-11-20 DIAGNOSIS — A419 Sepsis, unspecified organism: Secondary | ICD-10-CM

## 2022-11-20 DIAGNOSIS — Z8673 Personal history of transient ischemic attack (TIA), and cerebral infarction without residual deficits: Secondary | ICD-10-CM | POA: Diagnosis not present

## 2022-11-20 DIAGNOSIS — K5792 Diverticulitis of intestine, part unspecified, without perforation or abscess without bleeding: Principal | ICD-10-CM | POA: Diagnosis present

## 2022-11-20 DIAGNOSIS — K5732 Diverticulitis of large intestine without perforation or abscess without bleeding: Principal | ICD-10-CM | POA: Diagnosis present

## 2022-11-20 DIAGNOSIS — N179 Acute kidney failure, unspecified: Secondary | ICD-10-CM | POA: Diagnosis not present

## 2022-11-20 DIAGNOSIS — Z87891 Personal history of nicotine dependence: Secondary | ICD-10-CM

## 2022-11-20 DIAGNOSIS — F1011 Alcohol abuse, in remission: Secondary | ICD-10-CM | POA: Diagnosis not present

## 2022-11-20 DIAGNOSIS — Z0389 Encounter for observation for other suspected diseases and conditions ruled out: Secondary | ICD-10-CM | POA: Diagnosis not present

## 2022-11-20 DIAGNOSIS — Z85828 Personal history of other malignant neoplasm of skin: Secondary | ICD-10-CM | POA: Diagnosis not present

## 2022-11-20 DIAGNOSIS — Z807 Family history of other malignant neoplasms of lymphoid, hematopoietic and related tissues: Secondary | ICD-10-CM

## 2022-11-20 DIAGNOSIS — E876 Hypokalemia: Secondary | ICD-10-CM | POA: Diagnosis not present

## 2022-11-20 DIAGNOSIS — R1032 Left lower quadrant pain: Secondary | ICD-10-CM | POA: Diagnosis not present

## 2022-11-20 DIAGNOSIS — E7800 Pure hypercholesterolemia, unspecified: Secondary | ICD-10-CM | POA: Diagnosis not present

## 2022-11-20 DIAGNOSIS — Z1152 Encounter for screening for COVID-19: Secondary | ICD-10-CM | POA: Diagnosis not present

## 2022-11-20 DIAGNOSIS — N329 Bladder disorder, unspecified: Secondary | ICD-10-CM | POA: Diagnosis present

## 2022-11-20 DIAGNOSIS — Z7902 Long term (current) use of antithrombotics/antiplatelets: Secondary | ICD-10-CM | POA: Diagnosis not present

## 2022-11-20 DIAGNOSIS — Z79899 Other long term (current) drug therapy: Secondary | ICD-10-CM

## 2022-11-20 DIAGNOSIS — Z806 Family history of leukemia: Secondary | ICD-10-CM

## 2022-11-20 DIAGNOSIS — Z8719 Personal history of other diseases of the digestive system: Secondary | ICD-10-CM

## 2022-11-20 LAB — DIFFERENTIAL
Abs Immature Granulocytes: 0.2 10*3/uL — ABNORMAL HIGH (ref 0.00–0.07)
Basophils Absolute: 0.1 10*3/uL (ref 0.0–0.1)
Basophils Relative: 0 %
Eosinophils Absolute: 0 10*3/uL (ref 0.0–0.5)
Eosinophils Relative: 0 %
Immature Granulocytes: 1 %
Lymphocytes Relative: 9 %
Lymphs Abs: 2.1 10*3/uL (ref 0.7–4.0)
Monocytes Absolute: 1.7 10*3/uL — ABNORMAL HIGH (ref 0.1–1.0)
Monocytes Relative: 7 %
Neutro Abs: 20.2 10*3/uL — ABNORMAL HIGH (ref 1.7–7.7)
Neutrophils Relative %: 83 %

## 2022-11-20 LAB — COMPREHENSIVE METABOLIC PANEL
ALT: 16 U/L (ref 0–44)
AST: 14 U/L — ABNORMAL LOW (ref 15–41)
Albumin: 4.1 g/dL (ref 3.5–5.0)
Alkaline Phosphatase: 27 U/L — ABNORMAL LOW (ref 38–126)
Anion gap: 15 (ref 5–15)
BUN: 28 mg/dL — ABNORMAL HIGH (ref 8–23)
CO2: 23 mmol/L (ref 22–32)
Calcium: 8.7 mg/dL — ABNORMAL LOW (ref 8.9–10.3)
Chloride: 100 mmol/L (ref 98–111)
Creatinine, Ser: 1.65 mg/dL — ABNORMAL HIGH (ref 0.61–1.24)
GFR, Estimated: 45 mL/min — ABNORMAL LOW (ref >60)
Glucose, Bld: 134 mg/dL — ABNORMAL HIGH (ref 70–99)
Potassium: 3.1 mmol/L — ABNORMAL LOW (ref 3.5–5.1)
Sodium: 138 mmol/L (ref 135–145)
Total Bilirubin: 1.1 mg/dL (ref 0.3–1.2)
Total Protein: 7.6 g/dL (ref 6.5–8.1)

## 2022-11-20 LAB — CBC
HCT: 44.4 % (ref 39.0–52.0)
Hemoglobin: 15 g/dL (ref 13.0–17.0)
MCH: 29.4 pg (ref 26.0–34.0)
MCHC: 33.8 g/dL (ref 30.0–36.0)
MCV: 87.1 fL (ref 80.0–100.0)
Platelets: 299 10*3/uL (ref 150–400)
RBC: 5.1 MIL/uL (ref 4.22–5.81)
RDW: 14.3 % (ref 11.5–15.5)
WBC: 24.6 10*3/uL — ABNORMAL HIGH (ref 4.0–10.5)
nRBC: 0 % (ref 0.0–0.2)

## 2022-11-20 LAB — URINALYSIS, ROUTINE W REFLEX MICROSCOPIC
Bilirubin Urine: NEGATIVE
Glucose, UA: NEGATIVE mg/dL
Ketones, ur: NEGATIVE mg/dL
Leukocytes,Ua: NEGATIVE
Nitrite: NEGATIVE
Protein, ur: 30 mg/dL — AB
Specific Gravity, Urine: >1.046 — ABNORMAL HIGH (ref 1.005–1.030)
pH: 6 (ref 5.0–8.0)

## 2022-11-20 LAB — MAGNESIUM: Magnesium: 2 mg/dL (ref 1.7–2.4)

## 2022-11-20 LAB — RESP PANEL BY RT-PCR (RSV, FLU A&B, COVID)  RVPGX2
Influenza A by PCR: NEGATIVE
Influenza B by PCR: NEGATIVE
Resp Syncytial Virus by PCR: NEGATIVE
SARS Coronavirus 2 by RT PCR: NEGATIVE

## 2022-11-20 LAB — LACTIC ACID, PLASMA
Lactic Acid, Venous: 3.6 mmol/L (ref 0.5–1.9)
Lactic Acid, Venous: 1.7 mmol/L (ref 0.5–1.9)

## 2022-11-20 LAB — PROTIME-INR
INR: 1.2 (ref 0.8–1.2)
Prothrombin Time: 15 s (ref 11.4–15.2)

## 2022-11-20 LAB — LIPASE, BLOOD: Lipase: 21 U/L (ref 11–51)

## 2022-11-20 LAB — APTT: aPTT: 28 s (ref 24–36)

## 2022-11-20 MED ORDER — LACTATED RINGERS IV BOLUS
1000.0000 mL | Freq: Once | INTRAVENOUS | Status: AC
  Administered 2022-11-20: 1000 mL via INTRAVENOUS

## 2022-11-20 MED ORDER — HYDROMORPHONE HCL 1 MG/ML IJ SOLN
0.5000 mg | INTRAMUSCULAR | Status: DC | PRN
  Administered 2022-11-21: 0.5 mg via INTRAVENOUS
  Filled 2022-11-20: qty 0.5

## 2022-11-20 MED ORDER — LACTATED RINGERS IV SOLN: INTRAVENOUS | Status: DC

## 2022-11-20 MED ORDER — ATORVASTATIN CALCIUM 40 MG PO TABS
40.0000 mg | ORAL_TABLET | Freq: Every day | ORAL | Status: DC
  Administered 2022-11-21 – 2022-11-22 (×2): 40 mg via ORAL
  Filled 2022-11-20 (×2): qty 1

## 2022-11-20 MED ORDER — POTASSIUM CHLORIDE 10 MEQ/100ML IV SOLN
10.0000 meq | INTRAVENOUS | Status: DC
  Administered 2022-11-20: 10 meq via INTRAVENOUS

## 2022-11-20 MED ORDER — ONDANSETRON HCL 4 MG/2ML IJ SOLN
4.0000 mg | Freq: Once | INTRAMUSCULAR | Status: AC
  Administered 2022-11-20: 4 mg via INTRAVENOUS
  Filled 2022-11-20: qty 2

## 2022-11-20 MED ORDER — POTASSIUM CHLORIDE 10 MEQ/100ML IV SOLN
10.0000 meq | INTRAVENOUS | Status: AC
  Administered 2022-11-20 (×3): 10 meq via INTRAVENOUS
  Filled 2022-11-20: qty 100

## 2022-11-20 MED ORDER — ESCITALOPRAM OXALATE 10 MG PO TABS
10.0000 mg | ORAL_TABLET | Freq: Every day | ORAL | Status: DC
  Administered 2022-11-21 – 2022-11-22 (×2): 10 mg via ORAL
  Filled 2022-11-20 (×2): qty 1

## 2022-11-20 MED ORDER — PIPERACILLIN-TAZOBACTAM 3.375 G IVPB 30 MIN
3.3750 g | Freq: Once | INTRAVENOUS | Status: AC
  Administered 2022-11-20: 3.375 g via INTRAVENOUS
  Filled 2022-11-20: qty 50

## 2022-11-20 MED ORDER — SODIUM CHLORIDE 0.9% FLUSH
3.0000 mL | Freq: Two times a day (BID) | INTRAVENOUS | Status: DC
  Administered 2022-11-21 – 2022-11-22 (×2): 3 mL via INTRAVENOUS

## 2022-11-20 MED ORDER — HEPARIN SODIUM (PORCINE) 5000 UNIT/ML IJ SOLN
5000.0000 [IU] | Freq: Three times a day (TID) | INTRAMUSCULAR | Status: DC
  Administered 2022-11-20 – 2022-11-22 (×5): 5000 [IU] via SUBCUTANEOUS
  Filled 2022-11-20 (×5): qty 1

## 2022-11-20 MED ORDER — ACETAMINOPHEN 650 MG RE SUPP: 650.0000 mg | Freq: Four times a day (QID) | RECTAL | Status: DC | PRN

## 2022-11-20 MED ORDER — ONDANSETRON HCL 4 MG PO TABS: 4.0000 mg | ORAL_TABLET | Freq: Four times a day (QID) | ORAL | Status: DC | PRN

## 2022-11-20 MED ORDER — MORPHINE SULFATE (PF) 4 MG/ML IV SOLN
4.0000 mg | Freq: Once | INTRAVENOUS | Status: AC
  Administered 2022-11-20: 4 mg via INTRAVENOUS
  Filled 2022-11-20: qty 1

## 2022-11-20 MED ORDER — LACTATED RINGERS IV BOLUS: 1000.0000 mL | Freq: Once | INTRAVENOUS | Status: DC

## 2022-11-20 MED ORDER — IOHEXOL 300 MG/ML  SOLN
100.0000 mL | Freq: Once | INTRAMUSCULAR | Status: AC | PRN
  Administered 2022-11-20: 100 mL via INTRAVENOUS

## 2022-11-20 MED ORDER — ACETAMINOPHEN 325 MG PO TABS: 650.0000 mg | ORAL_TABLET | Freq: Four times a day (QID) | ORAL | Status: DC | PRN

## 2022-11-20 MED ORDER — OXYCODONE HCL 5 MG PO TABS
5.0000 mg | ORAL_TABLET | ORAL | Status: DC | PRN
  Administered 2022-11-20 – 2022-11-22 (×3): 5 mg via ORAL
  Filled 2022-11-20 (×4): qty 1

## 2022-11-20 MED ORDER — ORAL CARE MOUTH RINSE: 15.0000 mL | OROMUCOSAL | Status: DC | PRN

## 2022-11-20 MED ORDER — ONDANSETRON HCL 4 MG/2ML IJ SOLN: 4.0000 mg | Freq: Four times a day (QID) | INTRAMUSCULAR | Status: DC | PRN

## 2022-11-20 MED ORDER — PIPERACILLIN-TAZOBACTAM 3.375 G IVPB
3.3750 g | Freq: Three times a day (TID) | INTRAVENOUS | Status: DC
  Administered 2022-11-20 – 2022-11-22 (×5): 3.375 g via INTRAVENOUS
  Filled 2022-11-20 (×5): qty 50

## 2022-11-20 MED ORDER — HYDROMORPHONE HCL 1 MG/ML IJ SOLN
1.0000 mg | Freq: Once | INTRAMUSCULAR | Status: AC
  Administered 2022-11-20: 1 mg via INTRAVENOUS
  Filled 2022-11-20: qty 1

## 2022-11-20 MED ORDER — CLOPIDOGREL BISULFATE 75 MG PO TABS
75.0000 mg | ORAL_TABLET | Freq: Every day | ORAL | Status: DC
  Administered 2022-11-21 – 2022-11-22 (×2): 75 mg via ORAL
  Filled 2022-11-20 (×2): qty 1

## 2022-11-20 MED ORDER — POTASSIUM CHLORIDE CRYS ER 20 MEQ PO TBCR: 20.0000 meq | EXTENDED_RELEASE_TABLET | Freq: Once | ORAL | Status: DC

## 2022-11-20 NOTE — ED Triage Notes (Signed)
Patient arrives with complaints of diverticulitis flare up. Patient has recently seen and treated at various at another ED & Urgent Care. Completed a course of antibiotics, but symptoms have returned. Rates abdominal pain a 4/10.

## 2022-11-20 NOTE — ED Notes (Signed)
Patient transported to CT 

## 2022-11-20 NOTE — Progress Notes (Signed)
Pharmacy Antibiotic Note  William Wheeler is a 72 y.o. male admitted on 11/20/2022 with IAI, diverticulitis.  Pharmacy has been consulted for Zosyn dosing.  Plan: Zosyn 3.375g IV Q8H infused over 4hrs. Follow up renal function, culture results, and clinical course.   Height: 6\' 2"  (188 cm) Weight: 96.2 kg (212 lb) IBW/kg (Calculated) : 82.2  Temp (24hrs), Avg:98.6 F (37 C), Min:98.2 F (36.8 C), Max:98.9 F (37.2 C)  Recent Labs  Lab 11/20/22 1300 11/20/22 1319 11/20/22 1555  WBC 24.6*  --   --   CREATININE 1.65*  --   --   LATICACIDVEN  --  3.6* 1.7    Estimated Creatinine Clearance: 49.1 mL/min (A) (by C-G formula based on SCr of 1.65 mg/dL (H)).    No Known Allergies  Antimicrobials this admission: 9/15 Zosyn >>   Dose adjustments this admission:   Microbiology results: 9/15 BCx:  9/15 CDiff:   Thank you for allowing pharmacy to be a part of this patient's care.  Lynann Beaver PharmD, BCPS [facility] main pharmacy [phone] 11/20/2022 5:44 PM

## 2022-11-20 NOTE — ED Notes (Signed)
Patient is being discharged from the Urgent Care and sent to the Emergency Department via pov . Per Dr. Delton See, patient is in need of higher level of care due to severity. Patient is aware and verbalizes understanding of plan of care.  Vitals:   11/20/22 1126  BP: 101/69  Pulse: 75  Resp: 16  Temp: 98.6 F (37 C)  SpO2: 99%

## 2022-11-20 NOTE — ED Notes (Signed)
[facility] charge nurse courtney RN notified of patient

## 2022-11-20 NOTE — ED Notes (Signed)
Will give Zosyn as soon as we obtain blood cultures.

## 2022-11-20 NOTE — ED Provider Notes (Signed)
[name]    CSN: [id number] Arrival date & time: 11/20/22  1118      History   Chief Complaint Chief Complaint  Patient presents with   Abdominal Pain    HPI William Wheeler is a 72 y.o. male.   HPI  Patient has been seen 2 times in the last month for diverticular pain.  First he was seen on  10/23/2025 and was treated with Augmentin.  As long as he was on the antibiotic he felt improved.  He then had a relapse of his pain and went to the emergency room on 11/01/2022.  His white count was just over 18,000.  He had a CAT scan that showed diverticulitis.  He was given IV Cipro and metronidazole and then discharged on oral Cipro and metronidazole.  The medicines made him feel lousy, but his abdominal pain improved.  He has now off the oral antibiotics for the last 2 to 3 days and he has a recurrence of abdominal pain.  Sweats and chills.  Profuse watery diarrhea.  He is lost 20 pounds through this ordeal.  He has not eaten since Friday.  He is having trouble keeping up with fluids.  He feels lightheaded.  Past Medical History:  Diagnosis Date   Alcohol abuse    Alcohol addiction (HCC)    Colon polyps    Diverticulitis    Diverticulitis    Hypercholesteremia    Hypertension    Skin cancer    Stroke [facility])     Patient Active Problem List   Diagnosis Date Noted   Irritability 07/07/2022   Frequent urination 07/07/2022   Memory change 07/07/2022   Cracked skin on feet 02/06/2022   Facial skin lesion 02/06/2022   Oropharyngeal dysphagia 02/06/2022   Muscle cramping 02/06/2022   Left ear pain 02/06/2022   Arthralgia 04/01/2021   Chronic low back pain 03/11/2021   TIA (transient ischemic attack) 06/02/2020   Inguinal hernia 12/05/2019   History of CVA (cerebrovascular accident) without residual deficits 08/13/2019   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) 07/02/2019   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   Cervical stenosis of  spinal canal 11/16/2018   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   Herpes zoster without complication 11/16/2018   Essential hypertension 11/01/2018   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/01/2018   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 11/01/2018   Former very heavy cigarette smoker (more than 40 per day) 11/01/2018   History of colon polyps 11/01/2018   History of colonic diverticulitis 11/01/2018   Paresthesia of left arm 11/01/2018   Nocturia 11/01/2018    Past Surgical History:  Procedure Laterality Date   broken bone repair     Nose   HERNIA REPAIR     skin cancer removed  10/2021   forehead       Home Medications    Prior to Admission medications   Medication Sig Start Date End Date Taking? Authorizing Provider  acetaminophen (TYLENOL) 325 MG tablet Take 650 mg by mouth every 6 (six) hours as needed.    [provider]  ammonium lactate (AMLACTIN) 12 % cream Apply 1 Application topically as needed for dry skin. 08/18/22   Louann Sjogren, DPM  atorvastatin (LIPITOR) 40 MG tablet Take 1 tablet (40 mg total) by mouth daily. 10/03/22   Everrett Coombe, DO  clopidogrel (PLAVIX) 75 MG tablet Take 1 tablet (75 mg total) by mouth daily. 06/23/22   Everrett Coombe, DO  escitalopram (LEXAPRO) 10  MG tablet Take 1 tablet (10 mg total) by mouth daily. 08/03/22   Everrett Coombe, DO  losartan-hydrochlorothiazide (HYZAAR) 50-12.5 MG tablet Take 1 tablet by mouth daily. 10/03/22   Everrett Coombe, DO    Family History Family History  Problem Relation Age of Onset   Leukemia Mother    Non-Hodgkin's lymphoma Mother    Other Father        unsure of medical history    Social History Social History   Tobacco Use   Smoking status: Former    Current packs/day: 0.00    Average packs/day: 2.5 packs/day for 29.0 years (72.5 ttl pk-yrs)    Types: Cigarettes    Start date: 34    Quit date: 2001    Years since quitting: 23.7   Smokeless tobacco: Never  Vaping Use   Vaping status: Never Used   Substance Use Topics   Alcohol use: Not Currently   Drug use: Not Currently    Types: Marijuana     Allergies   Patient has no known allergies.   Review of Systems Review of Systems  See HPI Physical Exam Triage Vital Signs ED Triage Vitals  Encounter Vitals Group     BP 11/20/22 1126 101/69     Systolic BP Percentile --      Diastolic BP Percentile --      Pulse Rate 11/20/22 1126 75     Resp 11/20/22 1126 16     Temp 11/20/22 1126 98.6 F (37 C)     Temp Source 11/20/22 1126 Oral     SpO2 11/20/22 1126 99 %     Weight --      Height --      Head Circumference --      Peak Flow --      Pain Score 11/20/22 1128 5     Pain Loc --      Pain Education --      Exclude from Growth Chart --    No data found.  Updated Vital Signs BP 101/69 (BP Location: Right Arm)   Pulse 75   Temp 98.6 F (37 C) (Oral)   Resp 16   SpO2 99%      Physical Exam Constitutional:      General: He is not in acute distress.    Appearance: He is well-developed and normal weight. He is ill-appearing.     Comments: Patient is pale.  Skin is clammy.  Looks unwell  HENT:     Head: Normocephalic and atraumatic.     Mouth/Throat:     Comments: Mouth is dry.  Saliva is foamy Eyes:     Conjunctiva/sclera: Conjunctivae normal.     Pupils: Pupils are equal, round, and reactive to light.  Cardiovascular:     Rate and Rhythm: Regular rhythm. Tachycardia present.     Heart sounds: No murmur heard. Pulmonary:     Effort: Pulmonary effort is normal. No respiratory distress.     Breath sounds: Normal breath sounds.  Abdominal:     General: Abdomen is flat. Bowel sounds are decreased. There is no distension.     Palpations: Abdomen is soft. There is no hepatomegaly, splenomegaly or mass.     Tenderness: There is abdominal tenderness in the left lower quadrant. There is guarding and rebound.  Musculoskeletal:        General: Normal range of motion.     Cervical back: Normal range of motion.   Skin:    General: Skin  is warm and dry.  Neurological:     Mental Status: He is alert.      UC Treatments / Results  Labs (all labs ordered are listed, but only abnormal results are displayed) Labs Reviewed - No data to display  EKG   Radiology No results found.  Procedures Procedures (including critical care time)  Medications Ordered in UC Medications - No data to display  Initial Impression / Assessment and Plan / UC Course  I have reviewed the triage vital signs and the nursing notes.  Pertinent labs & imaging results that were available during my care of the patient were reviewed by me and considered in my medical decision making (see chart for details).     This patient has an acute abdomen.  He has remarkable rebound on examination and tenderness in the left lower quadrant.  His failure to respond to oral antibiotics leads me to believe he may have an occult abscess or other process going on that is keeping him from responding.  He needs to go back to the emergency room.  He needs IV fluids, stat labs, likely imaging, possible surgery.  This is discussed with him. Final Clinical Impressions(s) / UC Diagnoses   Final diagnoses:  Acute abdomen  Diverticulitis of colon     Discharge Instructions      GO TO ER     ED Prescriptions   None    PDMP not reviewed this encounter.   Eustace Moore, MD 11/20/22 [phone]

## 2022-11-20 NOTE — ED Triage Notes (Signed)
Seen for diverticulitis 4 weeks ago, given rx for abx. Returned 2 weeks later and was sent to emergency room, was put on flagyl and cipro. Completed abx on Tuesday, but then Friday evening began having pain again. Has chills, pain in abdomen, sweats. No n/v, some diarrhea.

## 2022-11-20 NOTE — ED Notes (Signed)
I have given report to [facility] and to [place], Charity fundraiser at [employer].

## 2022-11-20 NOTE — ED Notes (Signed)
Able to obtain only one set of BC.

## 2022-11-20 NOTE — Discharge Instructions (Signed)
GO TO ER

## 2022-11-20 NOTE — H&P (Signed)
History and Physical    William Wheeler WUJ:[id number] DOB: Apr 07, 1950 DOA: 11/20/2022  PCP: Everrett Coombe, DO   Patient coming from: Home   Chief Complaint: LLQ abdominal pain, diarrhea, sweats   HPI: William Wheeler is a 72 y.o. male with medical history significant for alcohol abuse in remission, hypertension, history of CVA, and diverticulitis who presents with left lower quadrant abdominal pain, loose stool, chills, and sweats.  Patient reports that he was started on amoxicillin roughly a month ago for suspected diverticulitis, failed to improve and was seen in the ED on 11/01/2022 where he had uncomplicated sigmoid diverticulitis on CT and was treated with ciprofloxacin and Flagyl.  He improved with that initially but developed recurrent LLQ pain, nonbloody diarrhea, chills, and sweats on 11/18/2022.  He denies chest pain, cough, or shortness of breath.  [facility] ED Course: Upon arrival to the ED, patient is found to be afebrile and saturating well on room air with stable blood pressure.  Labs are most notable for potassium 3.1, creatinine 1.65, WBC 24,000, and lactic acid 3.6.  CT is notable for sigmoid wall thickening with pericolonic edema, as well as 15 mm polypoid right bladder wall lesion.  Blood cultures collected and patient was treated with 1 L of LR, Dilaudid, morphine, Zofran, and Zosyn in the ED.  He was transferred to [facility] for admission.  Review of Systems:  All other systems reviewed and apart from HPI, are negative.  Past Medical History:  Diagnosis Date   Alcohol abuse    Alcohol addiction (HCC)    Colon polyps    Diverticulitis    Diverticulitis    Hypercholesteremia    Hypertension    Skin cancer    Stroke [facility])     Past Surgical History:  Procedure Laterality Date   broken bone repair     Nose   HERNIA REPAIR     skin cancer removed  10/2021   forehead    Social History:   reports that he quit smoking about 23 years ago.  His smoking use included cigarettes. He started smoking about 52 years ago. He has a 72.5 pack-year smoking history. He has never used smokeless tobacco. He reports that he does not currently use alcohol. He reports that he does not currently use drugs after having used the following drugs: Marijuana.  No Known Allergies  Family History  Problem Relation Age of Onset   Leukemia Mother    Non-Hodgkin's lymphoma Mother    Other Father        unsure of medical history     Prior to Admission medications   Medication Sig Start Date End Date Taking? Authorizing Provider  acetaminophen (TYLENOL) 325 MG tablet Take 650 mg by mouth every 6 (six) hours as needed.   Yes [provider]  atorvastatin (LIPITOR) 40 MG tablet Take 1 tablet (40 mg total) by mouth daily. 10/03/22  Yes Everrett Coombe, DO  clopidogrel (PLAVIX) 75 MG tablet Take 1 tablet (75 mg total) by mouth daily. 06/23/22  Yes Everrett Coombe, DO  escitalopram (LEXAPRO) 10 MG tablet Take 1 tablet (10 mg total) by mouth daily. 08/03/22  Yes Everrett Coombe, DO  losartan-hydrochlorothiazide (HYZAAR) 50-12.5 MG tablet Take 1 tablet by mouth daily. 10/03/22  Yes Everrett Coombe, DO    Physical Exam: Vitals:   11/20/22 1545 11/20/22 1600 11/20/22 1615 11/20/22 1714  BP: 138/82 135/81 (!) 117/99 107/82  Pulse: 95  (!) 118 (!) 102  Resp: 18 (!) 26  19 17  Temp:  98.7 F (37.1 C)  98.2 F (36.8 C)  TempSrc:  Oral  Oral  SpO2: 99% 99% 99% 99%  Weight:      Height:         Constitutional: NAD, no pallor or diaphoresis   Eyes: PERTLA, lids and conjunctivae normal ENMT: Mucous membranes are moist. Posterior pharynx clear of any exudate or lesions.   Neck: supple, no masses  Respiratory: no wheezing, no crackles. No accessory muscle use.  Cardiovascular: S1 & S2 heard, regular rate and rhythm. No extremity edema.   Abdomen: Soft, tender in LLQ without guarding or rebound pain. Bowel sounds active.  Musculoskeletal: no clubbing /  cyanosis. No joint deformity upper and lower extremities.   Skin: no significant rashes, lesions, ulcers. Warm, dry, well-perfused. Neurologic: CN 2-12 grossly intact. Moving all extremities. Alert and oriented.  Psychiatric: Calm. Cooperative.    Labs and Imaging on Admission: I have personally reviewed following labs and imaging studies  CBC: Recent Labs  Lab 11/20/22 1300  WBC 24.6*  NEUTROABS 20.2*  HGB 15.0  HCT 44.4  MCV 87.1  PLT 299   Basic Metabolic Panel: Recent Labs  Lab 11/20/22 1300  NA 138  K 3.1*  CL 100  CO2 23  GLUCOSE 134*  BUN 28*  CREATININE 1.65*  CALCIUM 8.7*  MG 2.0   GFR: Estimated Creatinine Clearance: 49.1 mL/min (A) (by C-G formula based on SCr of 1.65 mg/dL (H)). Liver Function Tests: Recent Labs  Lab 11/20/22 1300  AST 14*  ALT 16  ALKPHOS 27*  BILITOT 1.1  PROT 7.6  ALBUMIN 4.1   Recent Labs  Lab 11/20/22 1300  LIPASE 21   No results for input(s): "AMMONIA" in the last 168 hours. Coagulation Profile: Recent Labs  Lab 11/20/22 1414  INR 1.2   Cardiac Enzymes: No results for input(s): "CKTOTAL", "CKMB", "CKMBINDEX", "TROPONINI" in the last 168 hours. BNP (last 3 results) No results for input(s): "PROBNP" in the last 8760 hours. HbA1C: No results for input(s): "HGBA1C" in the last 72 hours. CBG: No results for input(s): "GLUCAP" in the last 168 hours. Lipid Profile: No results for input(s): "CHOL", "HDL", "LDLCALC", "TRIG", "CHOLHDL", "LDLDIRECT" in the last 72 hours. Thyroid Function Tests: No results for input(s): "TSH", "T4TOTAL", "FREET4", "T3FREE", "THYROIDAB" in the last 72 hours. Anemia Panel: No results for input(s): "VITAMINB12", "FOLATE", "FERRITIN", "TIBC", "IRON", "RETICCTPCT" in the last 72 hours. Urine analysis:    Component Value Date/Time   COLORURINE YELLOW 11/20/2022 1526   APPEARANCEUR CLEAR 11/20/2022 1526   LABSPEC >1.046 (H) 11/20/2022 1526   PHURINE 6.0 11/20/2022 1526   GLUCOSEU NEGATIVE  11/20/2022 1526   HGBUR SMALL (A) 11/20/2022 1526   BILIRUBINUR NEGATIVE 11/20/2022 1526   KETONESUR NEGATIVE 11/20/2022 1526   PROTEINUR 30 (A) 11/20/2022 1526   NITRITE NEGATIVE 11/20/2022 1526   LEUKOCYTESUR NEGATIVE 11/20/2022 1526   Sepsis Labs: @LABRCNTIP (procalcitonin:4,lacticidven:4) ) Recent Results (from the past 240 hour(s))  Resp panel by RT-PCR (RSV, Flu A&B, Covid) Anterior Nasal Swab     Status: None   Collection Time: 11/20/22  2:14 PM   Specimen: Anterior Nasal Swab  Result Value Ref Range Status   SARS Coronavirus 2 by RT PCR NEGATIVE NEGATIVE Final    Comment: (NOTE) SARS-CoV-2 target nucleic acids are NOT DETECTED.  The SARS-CoV-2 RNA is generally detectable in upper respiratory specimens during the acute phase of infection. The lowest concentration of SARS-CoV-2 viral copies this assay can detect is 138  copies/mL. A negative result does not preclude SARS-Cov-2 infection and should not be used as the sole basis for treatment or other patient management decisions. A negative result may occur with  improper specimen collection/handling, submission of specimen other than nasopharyngeal swab, presence of viral mutation(s) within the areas targeted by this assay, and inadequate number of viral copies(<138 copies/mL). A negative result must be combined with clinical observations, patient history, and epidemiological information. The expected result is Negative.  Fact Sheet for Patients:  BloggerCourse.com  Fact Sheet for Healthcare Providers:  SeriousBroker.it  This test is no t yet approved or cleared by the [place] FDA and  has been authorized for detection and/or diagnosis of SARS-CoV-2 by FDA under an Emergency Use Authorization (EUA). This EUA will remain  in effect (meaning this test can be used) for the duration of the COVID-19 declaration under Section 564(b)(1) of the Act, 21 U.S.C.section  360bbb-3(b)(1), unless the authorization is terminated  or revoked sooner.       Influenza A by PCR NEGATIVE NEGATIVE Final   Influenza B by PCR NEGATIVE NEGATIVE Final    Comment: (NOTE) The Xpert Xpress SARS-CoV-2/FLU/RSV plus assay is intended as an aid in the diagnosis of influenza from Nasopharyngeal swab specimens and should not be used as a sole basis for treatment. Nasal washings and aspirates are unacceptable for Xpert Xpress SARS-CoV-2/FLU/RSV testing.  Fact Sheet for Patients: BloggerCourse.com  Fact Sheet for Healthcare Providers: SeriousBroker.it  This test is not yet approved or cleared by the [place] FDA and has been authorized for detection and/or diagnosis of SARS-CoV-2 by FDA under an Emergency Use Authorization (EUA). This EUA will remain in effect (meaning this test can be used) for the duration of the COVID-19 declaration under Section 564(b)(1) of the Act, 21 U.S.C. section 360bbb-3(b)(1), unless the authorization is terminated or revoked.     Resp Syncytial Virus by PCR NEGATIVE NEGATIVE Final    Comment: (NOTE) Fact Sheet for Patients: BloggerCourse.com  Fact Sheet for Healthcare Providers: SeriousBroker.it  This test is not yet approved or cleared by the [place] FDA and has been authorized for detection and/or diagnosis of SARS-CoV-2 by FDA under an Emergency Use Authorization (EUA). This EUA will remain in effect (meaning this test can be used) for the duration of the COVID-19 declaration under Section 564(b)(1) of the Act, 21 U.S.C. section 360bbb-3(b)(1), unless the authorization is terminated or revoked.  Performed at [facility], [address], [place], [place] [address]      Radiological Exams on Admission: DG [facility] 1 View  Result Date: 11/20/2022 CLINICAL DATA:  Questionable sepsis - evaluate for  abnormality EXAM: PORTABLE CHEST - 1 VIEW COMPARISON:  None Available. FINDINGS: Cardiac silhouette is unremarkable. No pneumothorax or pleural effusion. The lungs are clear. The visualized skeletal structures are unremarkable. IMPRESSION: No acute cardiopulmonary process. Electronically Signed   By: Layla Maw M.D.   On: 11/20/2022 13:52   CT ABDOMEN PELVIS W CONTRAST  Result Date: 11/20/2022 CLINICAL DATA:  Left lower quadrant abdominal pain. EXAM: CT ABDOMEN AND PELVIS WITH CONTRAST TECHNIQUE: Multidetector CT imaging of the abdomen and pelvis was performed using the standard protocol following bolus administration of intravenous contrast. RADIATION DOSE REDUCTION: This exam was performed according to the departmental dose-optimization program which includes automated exposure control, adjustment of the mA and/or kV according to patient size and/or use of iterative reconstruction technique. CONTRAST:  OMNIPAQUE IOHEXOL 300 MG/ML  SOLN COMPARISON:  11/01/2022 FINDINGS: Lower chest:  Unremarkable. Hepatobiliary: No suspicious focal abnormality within the liver parenchyma. There is no evidence for gallstones, gallbladder wall thickening, or pericholecystic fluid. No intrahepatic or extrahepatic biliary dilation. Pancreas: No focal mass lesion. No dilatation of the main duct. No intraparenchymal cyst. No peripancreatic edema. Spleen: No splenomegaly. No suspicious focal mass lesion. Adrenals/Urinary Tract: No adrenal nodule or mass. Tiny well-defined homogeneous low-density lesions in both kidneys are too small to characterize but are statistically most likely benign and probably cysts. No followup imaging is recommended. No evidence for hydroureter. The urinary bladder appears normal for the degree of distention. Enhancing soft tissue at the bladder base likely reflects median lobe hypertrophy, similar to prior. Possible 15 mm polypoid mucosal lesion right bladder wall (axial image 82/2).  Stomach/Bowel: Stomach is unremarkable. No gastric wall thickening. No evidence of outlet obstruction. Duodenum is normally positioned as is the ligament of Treitz. No small bowel wall thickening. No small bowel dilatation. The terminal ileum is normal. The appendix is normal. Diverticular disease noted left colon with marked wall thickening in the sigmoid segment and pericolonic edema/inflammation along the sigmoid colon. This is associated with marked rectal wall thickening (see axial 83/2). Vascular/Lymphatic: There is mild atherosclerotic calcification of the abdominal aorta without aneurysm. There is no gastrohepatic or hepatoduodenal ligament lymphadenopathy. No retroperitoneal or mesenteric lymphadenopathy. No pelvic sidewall lymphadenopathy. Tiny lymph nodes are seen in the sigmoid mesocolon including 7 mm round node on image 58/2 adjacent to the superior rectal vein. 6 mm lymph node is identified adjacent to the IMA origin. Reproductive: The prostate gland and seminal vesicles are unremarkable. As above, there appears to be median lobe hypertrophy generating mass-effect on the bladder base. Other: No intraperitoneal free fluid. Musculoskeletal: Status post right groin herniorrhaphy. No worrisome lytic or sclerotic osseous abnormality. IMPRESSION: 1. Marked wall thickening in the sigmoid segment with associated pericolonic edema/. These changes are associated with marked rectal wall thickening. Imaging features raise the question of a left sided infectious/inflammatory colitis. Diverticulitis with secondary colonic wall thickening is also a consideration. Neoplasm considered less likely but not entirely excluded. 2. Tiny lymph nodes in the sigmoid mesocolon are likely reactive. 3. Possible 15 mm polypoid mucosal lesion right bladder wall. Urology consultation recommended. Hematuria protocol CT may prove helpful to further evaluate. 4. Enhancing soft tissue identified in the bladder base adjacent to the  prostate gland, likely secondary to median lobe hypertrophy. This could also be assessed at the time of urology follow-up. 5.  Aortic Atherosclerosis (ICD10-I70.0). Electronically Signed   By: Kennith Center M.D.   On: 11/20/2022 13:50     Assessment/Plan   1. Colitis  - Presumed infectious, suspected secondary to diverticulitis, no fluid-collection or free air on imaging  - Treat with Zosyn, follow cultures and clinical course   2. AKI  - SCr is 1.65 on admission, up from 1.08 last month  - No hydro on CT in ED, likely prerenal in setting of loss of appetite and loose stools   - Hold losartan-HCTZ, continue IVF hydration, renally-dose medications, repeat chem panel in am   3. Hypokalemia  - Replaced in ED  4. Hx of CVA  - Continue Lipitor and Plavix   5. Urinary bladder lesion  - Noted incidentally on CT in ED  - Discussed with patient, outpatient urology follow-up recommended    DVT prophylaxis: sq heparin   Code Status: Full  Level of Care: Level of care: Progressive Family Communication: Wife at bedside Disposition Plan:  Patient is from: home  Anticipated d/c is to: Home  Anticipated d/c date is: 11/22/22  Patient currently: Pending clinical improvement  Consults called: None  Admission status: Inpatient     Briscoe Deutscher, MD [facility]  11/20/2022, 6:56 PM

## 2022-11-20 NOTE — ED Provider Notes (Signed)
[facility] EMERGENCY DEPARTMENT AT [facility] Provider Note   CSN: [id number] Arrival date & time: 11/20/22  1242     History  Chief Complaint  Patient presents with   Abdominal Pain    William Wheeler is a 72 y.o. male.   Abdominal Pain   72 year old male presents emergency department with complaints of left lower abdominal pain.  Patient with history of diverticulitis and recently seen at both urgent care and emergency department for "diverticulitis flareups."  States that he was most recently placed on ciprofloxacin as well as Flagyl on 11/01/2022 and had uncomplicated diverticulitis on CT scan at that time.  States that symptoms were doing well up until Friday when symptoms return.  Before then, was placed on Augmentin by urgent care in the outpatient setting on 10/22/2022.  Reports return of diarrhea as well as left lower abdominal pain.  States he had several loose bowel meds daily since symptoms began again.  Was seen at urgent care and sent to the emergency department due to concern for complicated diverticulitis.  Patient denies any fever, chills, chest pain, shortness of breath, urinary symptoms, hematochezia/melena.  Past medical history significant for alcohol abuse, diverticulitis, hypercholesterolemia, hypertension, CVA, diverticulitis  Home Medications Prior to Admission medications   Medication Sig Start Date End Date Taking? Authorizing Provider  acetaminophen (TYLENOL) 325 MG tablet Take 650 mg by mouth every 6 (six) hours as needed.    [provider]  ammonium lactate (AMLACTIN) 12 % cream Apply 1 Application topically as needed for dry skin. 08/18/22   Louann Sjogren, DPM  atorvastatin (LIPITOR) 40 MG tablet Take 1 tablet (40 mg total) by mouth daily. 10/03/22   Everrett Coombe, DO  clopidogrel (PLAVIX) 75 MG tablet Take 1 tablet (75 mg total) by mouth daily. 06/23/22   Everrett Coombe, DO  escitalopram (LEXAPRO) 10 MG tablet Take 1 tablet (10 mg total)  by mouth daily. 08/03/22   Everrett Coombe, DO  losartan-hydrochlorothiazide (HYZAAR) 50-12.5 MG tablet Take 1 tablet by mouth daily. 10/03/22   Everrett Coombe, DO      Allergies    Patient has no known allergies.    Review of Systems   Review of Systems  Gastrointestinal:  Positive for abdominal pain.  All other systems reviewed and are negative.   Physical Exam Updated Vital Signs BP (!) 117/99   Pulse (!) 118   Temp 98.7 F (37.1 C) (Oral)   Resp 19   Ht 6\' 2"  (1.88 m)   Wt 96.2 kg   SpO2 99%   BMI 27.22 kg/m  Physical Exam Vitals and nursing note reviewed.  Constitutional:      General: He is not in acute distress.    Appearance: He is well-developed.  HENT:     Head: Normocephalic and atraumatic.  Eyes:     Conjunctiva/sclera: Conjunctivae normal.  Cardiovascular:     Rate and Rhythm: Normal rate and regular rhythm.     Heart sounds: No murmur heard. Pulmonary:     Effort: Pulmonary effort is normal. No respiratory distress.     Breath sounds: Normal breath sounds.  Abdominal:     Palpations: Abdomen is soft.     Tenderness: There is abdominal tenderness in the left lower quadrant. There is no right CVA tenderness or left CVA tenderness.     Comments: Heeltap sign negative.  No rebound tenderness.  Left lower quadrant tenderness with some guarding.  Musculoskeletal:        General: No swelling.  Cervical back: Neck supple.  Skin:    General: Skin is warm and dry.     Capillary Refill: Capillary refill takes less than 2 seconds.  Neurological:     Mental Status: He is alert.  Psychiatric:        Mood and Affect: Mood normal.     ED Results / Procedures / Treatments   Labs (all labs ordered are listed, but only abnormal results are displayed) Labs Reviewed  COMPREHENSIVE METABOLIC PANEL - Abnormal; Notable for the following components:      Result Value   Potassium 3.1 (*)    Glucose, Bld 134 (*)    BUN 28 (*)    Creatinine, Ser 1.65 (*)     Calcium 8.7 (*)    AST 14 (*)    Alkaline Phosphatase 27 (*)    GFR, Estimated 45 (*)    All other components within normal limits  CBC - Abnormal; Notable for the following components:   WBC 24.6 (*)    All other components within normal limits  URINALYSIS, ROUTINE W REFLEX MICROSCOPIC - Abnormal; Notable for the following components:   Specific Gravity, Urine >1.046 (*)    Hgb urine dipstick SMALL (*)    Protein, ur 30 (*)    Bacteria, UA RARE (*)    All other components within normal limits  LACTIC ACID, PLASMA - Abnormal; Notable for the following components:   Lactic Acid, Venous 3.6 (*)    All other components within normal limits  DIFFERENTIAL - Abnormal; Notable for the following components:   Neutro Abs 20.2 (*)    Monocytes Absolute 1.7 (*)    Abs Immature Granulocytes 0.20 (*)    All other components within normal limits  RESP PANEL BY RT-PCR (RSV, FLU A&B, COVID)  RVPGX2  CULTURE, BLOOD (ROUTINE X 2)  CULTURE, BLOOD (ROUTINE X 2)  C DIFFICILE QUICK SCREEN W PCR REFLEX    LIPASE, BLOOD  LACTIC ACID, PLASMA  PROTIME-INR  APTT  MAGNESIUM    EKG None  Radiology DG Chest Port 1 View  Result Date: 11/20/2022 CLINICAL DATA:  Questionable sepsis - evaluate for abnormality EXAM: PORTABLE CHEST - 1 VIEW COMPARISON:  None Available. FINDINGS: Cardiac silhouette is unremarkable. No pneumothorax or pleural effusion. The lungs are clear. The visualized skeletal structures are unremarkable. IMPRESSION: No acute cardiopulmonary process. Electronically Signed   By: Layla Maw M.D.   On: 11/20/2022 13:52   CT ABDOMEN PELVIS W CONTRAST  Result Date: 11/20/2022 CLINICAL DATA:  Left lower quadrant abdominal pain. EXAM: CT ABDOMEN AND PELVIS WITH CONTRAST TECHNIQUE: Multidetector CT imaging of the abdomen and pelvis was performed using the standard protocol following bolus administration of intravenous contrast. RADIATION DOSE REDUCTION: This exam was performed according to the  departmental dose-optimization program which includes automated exposure control, adjustment of the mA and/or kV according to patient size and/or use of iterative reconstruction technique. CONTRAST:  OMNIPAQUE IOHEXOL 300 MG/ML  SOLN COMPARISON:  11/01/2022 FINDINGS: Lower chest: Unremarkable. Hepatobiliary: No suspicious focal abnormality within the liver parenchyma. There is no evidence for gallstones, gallbladder wall thickening, or pericholecystic fluid. No intrahepatic or extrahepatic biliary dilation. Pancreas: No focal mass lesion. No dilatation of the main duct. No intraparenchymal cyst. No peripancreatic edema. Spleen: No splenomegaly. No suspicious focal mass lesion. Adrenals/Urinary Tract: No adrenal nodule or mass. Tiny well-defined homogeneous low-density lesions in both kidneys are too small to characterize but are statistically most likely benign and probably cysts. No followup imaging  is recommended. No evidence for hydroureter. The urinary bladder appears normal for the degree of distention. Enhancing soft tissue at the bladder base likely reflects median lobe hypertrophy, similar to prior. Possible 15 mm polypoid mucosal lesion right bladder wall (axial image 82/2). Stomach/Bowel: Stomach is unremarkable. No gastric wall thickening. No evidence of outlet obstruction. Duodenum is normally positioned as is the ligament of Treitz. No small bowel wall thickening. No small bowel dilatation. The terminal ileum is normal. The appendix is normal. Diverticular disease noted left colon with marked wall thickening in the sigmoid segment and pericolonic edema/inflammation along the sigmoid colon. This is associated with marked rectal wall thickening (see axial 83/2). Vascular/Lymphatic: There is mild atherosclerotic calcification of the abdominal aorta without aneurysm. There is no gastrohepatic or hepatoduodenal ligament lymphadenopathy. No retroperitoneal or mesenteric lymphadenopathy. No pelvic  sidewall lymphadenopathy. Tiny lymph nodes are seen in the sigmoid mesocolon including 7 mm round node on image 58/2 adjacent to the superior rectal vein. 6 mm lymph node is identified adjacent to the IMA origin. Reproductive: The prostate gland and seminal vesicles are unremarkable. As above, there appears to be median lobe hypertrophy generating mass-effect on the bladder base. Other: No intraperitoneal free fluid. Musculoskeletal: Status post right groin herniorrhaphy. No worrisome lytic or sclerotic osseous abnormality. IMPRESSION: 1. Marked wall thickening in the sigmoid segment with associated pericolonic edema/. These changes are associated with marked rectal wall thickening. Imaging features raise the question of a left sided infectious/inflammatory colitis. Diverticulitis with secondary colonic wall thickening is also a consideration. Neoplasm considered less likely but not entirely excluded. 2. Tiny lymph nodes in the sigmoid mesocolon are likely reactive. 3. Possible 15 mm polypoid mucosal lesion right bladder wall. Urology consultation recommended. Hematuria protocol CT may prove helpful to further evaluate. 4. Enhancing soft tissue identified in the bladder base adjacent to the prostate gland, likely secondary to median lobe hypertrophy. This could also be assessed at the time of urology follow-up. 5.  Aortic Atherosclerosis (ICD10-I70.0). Electronically Signed   By: Kennith Center M.D.   On: 11/20/2022 13:50    Procedures .Critical Care  Performed by: Peter Garter, PA Authorized by: Peter Garter, PA   Critical care provider statement:    Critical care time (minutes):  54   Critical care was necessary to treat or prevent imminent or life-threatening deterioration of the following conditions:  Sepsis   Critical care was time spent personally by me on the following activities:  Development of treatment plan with patient or surrogate, discussions with consultants, evaluation of patient's  response to treatment, examination of patient, ordering and review of laboratory studies, ordering and review of radiographic studies, ordering and performing treatments and interventions, pulse oximetry, re-evaluation of patient's condition and review of old charts   I assumed direction of critical care for this patient from another provider in my specialty: no     Care discussed with: admitting provider       Medications Ordered in ED Medications  lactated ringers infusion ( Intravenous New Bag/Given 11/20/22 1555)  potassium chloride 10 mEq in 100 mL IVPB (10 mEq Intravenous New Bag/Given 11/20/22 1521)  lactated ringers bolus 1,000 mL (0 mLs Intravenous Stopped 11/20/22 1446)  ondansetron (ZOFRAN) injection 4 mg (4 mg Intravenous Given 11/20/22 1340)  morphine (PF) 4 MG/ML injection 4 mg (4 mg Intravenous Given 11/20/22 1340)  piperacillin-tazobactam (ZOSYN) IVPB 3.375 g (0 g Intravenous Stopped 11/20/22 1523)  iohexol (OMNIPAQUE) 300 MG/ML solution 100 mL (100 mLs Intravenous Contrast  Given 11/20/22 1332)  lactated ringers bolus 1,000 mL (0 mLs Intravenous Stopped 11/20/22 1550)  HYDROmorphone (DILAUDID) injection 1 mg (1 mg Intravenous Given 11/20/22 1607)    ED Course/ Medical Decision Making/ A&P Clinical Course as of 11/20/22 1642  Sun Nov 20, 2022  1531 Consulted Dr. Margo Aye of hospitalist who agreed with admission and assume further treatment/care. [CR]    Clinical Course User Index [CR] Peter Garter, PA                                 Medical Decision Making Amount and/or Complexity of Data Reviewed Labs: ordered. Radiology: ordered. ECG/medicine tests: ordered.  Risk Prescription drug management. Decision regarding hospitalization.   This patient presents to the ED for concern of abdominal pain, this involves an extensive number of treatment options, and is a complaint that carries with it a high risk of complications and morbidity.  The differential diagnosis includes  diverticulitis, peritonitis, abscess, SBO/LBO, volvulus, appendicitis   Co morbidities that complicate the patient evaluation  See HPI   Additional history obtained:  Additional history obtained from EMR External records from outside source obtained and reviewed including hospital records   Lab Tests:  I Ordered, and personally interpreted labs.  The pertinent results include: Leukocytosis of 24.6 with left shift.  No evidence of anemia.  Placed within range.  Patient with evidence of AKI with creatinine 1.65, BUN of 28 and GFR 45.  No transaminitis.  Patient with hypokalemia 3.1, hypocalcemia of 8.7 otherwise, electrolytes within normal limits.  Lipase within normal limits.  Lactic acid elevated at 3.6.   Imaging Studies ordered:  I ordered imaging studies including CT abdomen pelvis, chest x-ray I independently visualized and interpreted imaging which showed  Chest x-ray: No acute cardiopulmonary maladies CT abdomen pelvis: Marked wall thickening and sigmoid segment with pericolonic edema.  Lymph node of sigmoid mesocolon.  15 mm polypoid mucosal lesion of right bladder wall.  Soft tissue enhancement of bladder base adjacent to prostate gland concerning for median lobe hypertrophy.  Aortic atherosclerosis. I agree with the radiologist interpretation  Cardiac Monitoring: / EKG:  The patient was maintained on a cardiac monitor.  I personally viewed and interpreted the cardiac monitored which showed an underlying rhythm of: Sinus tachycardia   Consultations Obtained:  See ED course  Problem List / ED Course / Critical interventions / Medication management  Sepsis, diverticulitis, AKI, bladder wall lesion I ordered medication including lactated Ringer's, Zosyn, morphine, Dilaudid, potassium chloride  Reevaluation of the patient after these medicines showed that the patient improved I have reviewed the patients home medicines and have made adjustments as needed   Social  Determinants of Health:  Former cigarette use.  Denies illicit drug use.   Test / Admission - Considered:  Sepsis, diverticulitis, AKI, bladder wall lesion Vitals signs significant for initial tachycardia with a heart rate of 105 4-7. Otherwise within normal range and stable throughout visit. Laboratory/imaging studies significant for: See above 73 year old male presents emergency department with complaints of left lower abdominal pain, diarrhea, nausea since Friday of this past week.  Patient is already being treated twice in the outpatient setting for diverticulitis with Augmentin and subsequently Cipro and Flagyl both this past month.  On exam, patient with left lower quadrant tenderness without peritoneal signs.  Patient with meeting of SIRS criteria with tachycardia, leukocytosis so sepsis protocol was followed with presumed intra-abdominal infectious source.  Patient  begun on broad-spectrum antibiotics as well as with fluid bolus given lactic acid initially resulting of 3.6.  CT imaging did show evidence of most likely diverticulitis without evidence of perforation or abscess which is most likely causing patient's symptoms.  Consulted hospitalist who agreed with admission. Treatment plan were discussed at length with patient and they knowledge understanding was agreeable to said plan.  Appropriate consultations were made as described in the ED course.  Patient was stable upon admission to the hospital.         Final Clinical Impression(s) / ED Diagnoses Final diagnoses:  Diverticulitis  Lesion of bladder  AKI (acute kidney injury) (HCC)  Sepsis, due to unspecified organism, unspecified whether acute organ dysfunction present [facility])  Hypokalemia    Rx / DC Orders ED Discharge Orders     None         Peter Garter, [place] 11/20/22 1642    Coral Spikes, DO 11/24/22 0703

## 2022-11-20 NOTE — ED Notes (Signed)
[facility] called for transport, bed assignment is ready

## 2022-11-21 DIAGNOSIS — K5792 Diverticulitis of intestine, part unspecified, without perforation or abscess without bleeding: Secondary | ICD-10-CM | POA: Diagnosis not present

## 2022-11-21 DIAGNOSIS — N329 Bladder disorder, unspecified: Secondary | ICD-10-CM | POA: Diagnosis not present

## 2022-11-21 DIAGNOSIS — N179 Acute kidney failure, unspecified: Secondary | ICD-10-CM | POA: Diagnosis not present

## 2022-11-21 LAB — HIV ANTIBODY (ROUTINE TESTING W REFLEX): HIV Screen 4th Generation wRfx: NONREACTIVE

## 2022-11-21 LAB — BASIC METABOLIC PANEL
Anion gap: 8 (ref 5–15)
BUN: 18 mg/dL (ref 8–23)
CO2: 25 mmol/L (ref 22–32)
Calcium: 7.9 mg/dL — ABNORMAL LOW (ref 8.9–10.3)
Chloride: 101 mmol/L (ref 98–111)
Creatinine, Ser: 1.11 mg/dL (ref 0.61–1.24)
GFR, Estimated: >60 mL/min (ref >60)
Glucose, Bld: 102 mg/dL — ABNORMAL HIGH (ref 70–99)
Potassium: 3.2 mmol/L — ABNORMAL LOW (ref 3.5–5.1)
Sodium: 134 mmol/L — ABNORMAL LOW (ref 135–145)

## 2022-11-21 LAB — MAGNESIUM: Magnesium: 2.2 mg/dL (ref 1.7–2.4)

## 2022-11-21 LAB — CBC
HCT: 37.9 % — ABNORMAL LOW (ref 39.0–52.0)
Hemoglobin: 12.4 g/dL — ABNORMAL LOW (ref 13.0–17.0)
MCH: 28.6 pg (ref 26.0–34.0)
MCHC: 32.7 g/dL (ref 30.0–36.0)
MCV: 87.5 fL (ref 80.0–100.0)
Platelets: 225 10*3/uL (ref 150–400)
RBC: 4.33 MIL/uL (ref 4.22–5.81)
RDW: 14 % (ref 11.5–15.5)
WBC: 12.9 10*3/uL — ABNORMAL HIGH (ref 4.0–10.5)
nRBC: 0 % (ref 0.0–0.2)

## 2022-11-21 MED ORDER — HYDROMORPHONE HCL 1 MG/ML IJ SOLN
0.5000 mg | INTRAMUSCULAR | Status: DC | PRN
  Administered 2022-11-21 (×2): 0.5 mg via INTRAVENOUS
  Filled 2022-11-21 (×2): qty 0.5

## 2022-11-21 MED ORDER — SODIUM CHLORIDE 0.9 % IV SOLN: INTRAVENOUS | Status: DC

## 2022-11-21 MED ORDER — POTASSIUM CHLORIDE CRYS ER 20 MEQ PO TBCR
40.0000 meq | EXTENDED_RELEASE_TABLET | Freq: Once | ORAL | Status: AC
  Administered 2022-11-21: 40 meq via ORAL
  Filled 2022-11-21: qty 2

## 2022-11-21 NOTE — Assessment & Plan Note (Signed)
-   History of multiple episodes of diverticulitis approximately every couple years per patient.  Has not required hospitalization or surgery in the past.  Last colonoscopy approximately 10 years ago - per CT: "Marked wall thickening in the sigmoid segment with associated pericolonic edema/. These changes are associated with marked rectal wall thickening. Imaging features raise the question of a left sided infectious/inflammatory colitis. Diverticulitis with secondary colonic wall thickening is also a consideration. Neoplasm considered less likely but not entirely excluded." - s/p 2 courses abx outpatient with some improvement inbetween; Augmentin followed by cipro/flagyl - ate large meals after recent recovery, then felt worse again; informed of need for soft diet for 1-2 weeks at discharge with low fiber - s/p zosyn; continue on Augmentin to complete course again - tolerated CLD but wishes to continue advancement at home; given rapid improvement and patient knowing how to go slower on diet advancement this time around, feel discharge is appropriate -He will follow-up outpatient for GI referral to discuss colonoscopy after recovery

## 2022-11-21 NOTE — Assessment & Plan Note (Addendum)
-   per CT read: "Possible 15 mm polypoid mucosal lesion right bladder wall. Urology consultation recommended. Hematuria protocol CT may prove helpful to further evaluate." - will discuss with urology; patient will be set up for outpatient follow up with [facility] for probable cystoscopy

## 2022-11-21 NOTE — Assessment & Plan Note (Signed)
-   Losartan/HCTZ on hold in setting of AKI

## 2022-11-21 NOTE — Hospital Course (Signed)
William Wheeler is a 72 yo male with PMH diverticulitis, alcohol use in remission, HTN, CVA who presented with persistent left lower quadrant abdominal pain, loose stools, chills and sweats. He has been on 2 courses of antibiotics since 10/24/2022.  Initially took 1 week course of Augmentin for diverticulitis then after recurrent symptoms was started on a 10-day course of Cipro/Flagyl. He endorsed feeling better after the Cipro/Flagyl course and began eating like normal again.  After this, he began feeling sick after approximately 2 days. CT abdomen/pelvis on admission showed left-sided diverticulitis with marked sigmoid wall thickening and rectal wall thickening.  There was also a noted 15 mm polypoid mucosal lesion in the right bladder wall. WBC was significantly elevated on admission, 24.6.  He was started on Zosyn and admitted for ongoing monitoring. He had good clinical improvement with downtrend in leukocytosis.  Abdominal pain improved and he wished to continue with diet advancement at home.  Diet recommendations were given at discharge as well.

## 2022-11-21 NOTE — Assessment & Plan Note (Signed)
Repleted.

## 2022-11-21 NOTE — Assessment & Plan Note (Signed)
-   baseline creatinine ~ 1 - patient presents with increase in creat >0.3 mg/dL above baseline or creat increase >1.5x baseline presumed to have occurred within past 7 days PTA -Creatinine 1.65 on admission -Suspect prerenal/volume depletion from GI losses -Creatinine improved back to normal prior to discharge

## 2022-11-21 NOTE — Progress Notes (Addendum)
Progress Note    William Wheeler   [id number]  DOB: 07/03/1950  DOA: 11/20/2022     1 PCP: Everrett Coombe, DO  Initial CC: left abd pain  Hospital Course: William Wheeler is a 72 yo male with PMH diverticulitis, alcohol use in remission, HTN, CVA who presented with persistent left lower quadrant abdominal pain, loose stools, chills and sweats. He has been on 2 courses of antibiotics since 10/24/2022.  Initially took 1 week course of Augmentin for diverticulitis then after recurrent symptoms was started on a 10-day course of Cipro/Flagyl. He endorsed feeling better after the Cipro/Flagyl course and began eating like normal again.  After this, he began feeling sick after approximately 2 days. CT abdomen/pelvis on admission showed left-sided diverticulitis with marked sigmoid wall thickening and rectal wall thickening.  There was also a noted 15 mm polypoid mucosal lesion in the right bladder wall. WBC was significantly elevated on admission, 24.6.  He was started on Zosyn and admitted for ongoing monitoring.  Interval History:  Presented with recurrent left lower quadrant abdominal pain.  Endorsed feeling better after recent Cipro/Flagyl course outpatient but began eating regular diet and then started feeling worse again.  Some nausea, no significant vomiting.  Still has loose stools, no blood.  Amenable for trial of clear liquids today.  Assessment and Plan: * Acute diverticulitis - History of multiple episodes of diverticulitis approximately every couple years per patient.  Has not required hospitalization or surgery in the past.  Last colonoscopy approximately 10 years ago - per CT: "Marked wall thickening in the sigmoid segment with associated pericolonic edema/. These changes are associated with marked rectal wall thickening. Imaging features raise the question of a left sided infectious/inflammatory colitis. Diverticulitis with secondary colonic wall thickening is also a consideration.  Neoplasm considered less likely but not entirely excluded." - s/p 2 courses abx outpatient with some improvement inbetween; Augmentin followed by cipro/flagyl - ate large meals after recent recovery, then felt worse again; informed of need for soft diet for 1-2 weeks at discharge with low fiber - continue zosyn - trial of CLD today - outpatient followup with GI for colonoscopy after recovery; he is not interested in surgery referral for resection at this time   AKI (acute kidney injury) (HCC) - baseline creatinine ~ 1 - patient presents with increase in creat >0.3 mg/dL above baseline or creat increase >1.5x baseline presumed to have occurred within past 7 days PTA -Creatinine 1.65 on admission -Suspect prerenal/volume depletion from GI losses - Continue fluids - Trend BMP   Lesion of urinary bladder - per CT read: "Possible 15 mm polypoid mucosal lesion right bladder wall. Urology consultation recommended. Hematuria protocol CT may prove helpful to further evaluate." - will discuss with urology; patient will be set up for outpatient follow up with [facility] for probable cystoscopy   Hypokalemia - Replete as needed  History of CVA (cerebrovascular accident) without residual deficits - On Plavix and Lipitor  Essential hypertension - Losartan/HCTZ on hold in setting of AKI   Old records reviewed in assessment of this patient  Antimicrobials: Zosyn 11/20/2022 >> current  DVT prophylaxis:  heparin injection 5,000 Units Start: 11/20/22 2200   Code Status:   Code Status: Full Code  Mobility Assessment (Last 72 Hours)     Mobility Assessment     Row Name 11/20/22 2003 11/20/22 1900         Does patient have an order for bedrest or is patient medically unstable No - Continue  assessment No - Continue assessment      What is the highest level of mobility based on the progressive mobility assessment? Level 6 (Walks independently in room and hall) - Balance while walking  in room without assist - Complete Level 5 (Walks with assist in room/hall) - Balance while stepping forward/back and can walk in room with assist - Complete               Barriers to discharge: None Disposition Plan: Home Status is: Inpatient  Objective: Blood pressure 130/88, pulse 85, temperature 97.9 F (36.6 C), temperature source Oral, resp. rate 20, height 6\' 2"  (1.88 m), weight 95.1 kg, SpO2 100%.  Examination:  Physical Exam Constitutional:      General: He is not in acute distress.    Appearance: Normal appearance.  HENT:     Head: Normocephalic and atraumatic.     Mouth/Throat:     Mouth: Mucous membranes are moist.  Eyes:     Extraocular Movements: Extraocular movements intact.  Cardiovascular:     Rate and Rhythm: Normal rate and regular rhythm.  Pulmonary:     Effort: Pulmonary effort is normal. No respiratory distress.     Breath sounds: Normal breath sounds. No wheezing.  Abdominal:     General: Bowel sounds are normal. There is no distension.     Palpations: Abdomen is soft.     Tenderness: There is abdominal tenderness in the left lower quadrant. There is no guarding or rebound.  Musculoskeletal:        General: Normal range of motion.     Cervical back: Normal range of motion and neck supple.  Skin:    General: Skin is warm and dry.  Neurological:     General: No focal deficit present.     Mental Status: He is alert.  Psychiatric:        Mood and Affect: Mood normal.        Behavior: Behavior normal.      Consultants:    Procedures:    Data Reviewed: Results for orders placed or performed during the hospital encounter of 11/20/22 (from the past 24 hour(s))  Urinalysis, Routine w reflex microscopic -Urine, Clean Catch     Status: Abnormal   Collection Time: 11/20/22  3:26 PM  Result Value Ref Range   Color, Urine YELLOW YELLOW   APPearance CLEAR CLEAR   Specific Gravity, Urine >1.046 (H) 1.005 - 1.030   pH 6.0 5.0 - 8.0   Glucose, UA  NEGATIVE NEGATIVE mg/dL   Hgb urine dipstick SMALL (A) NEGATIVE   Bilirubin Urine NEGATIVE NEGATIVE   Ketones, ur NEGATIVE NEGATIVE mg/dL   Protein, ur 30 (A) NEGATIVE mg/dL   Nitrite NEGATIVE NEGATIVE   Leukocytes,Ua NEGATIVE NEGATIVE   RBC / HPF 6-10 0 - 5 RBC/hpf   WBC, UA 0-5 0 - 5 WBC/hpf   Bacteria, UA RARE (A) NONE SEEN   Squamous Epithelial / HPF 0-5 0 - 5 /HPF   Mucus PRESENT   Lactic acid, plasma     Status: None   Collection Time: 11/20/22  3:55 PM  Result Value Ref Range   Lactic Acid, Venous 1.7 0.5 - 1.9 mmol/L  HIV Antibody (routine testing w rflx)     Status: None   Collection Time: 11/21/22  5:10 AM  Result Value Ref Range   HIV Screen 4th Generation wRfx Non Reactive Non Reactive  Basic metabolic panel     Status: Abnormal   Collection Time:  11/21/22  5:10 AM  Result Value Ref Range   Sodium 134 (L) 135 - 145 mmol/L   Potassium 3.2 (L) 3.5 - 5.1 mmol/L   Chloride 101 98 - 111 mmol/L   CO2 25 22 - 32 mmol/L   Glucose, Bld 102 (H) 70 - 99 mg/dL   BUN 18 8 - 23 mg/dL   Creatinine, Ser 6.96 0.61 - 1.24 mg/dL   Calcium 7.9 (L) 8.9 - 10.3 mg/dL   GFR, Estimated >29 >52 mL/min   Anion gap 8 5 - 15  CBC     Status: Abnormal   Collection Time: 11/21/22  5:10 AM  Result Value Ref Range   WBC 12.9 (H) 4.0 - 10.5 K/uL   RBC 4.33 4.22 - 5.81 MIL/uL   Hemoglobin 12.4 (L) 13.0 - 17.0 g/dL   HCT 84.1 (L) 32.4 - 40.1 %   MCV 87.5 80.0 - 100.0 fL   MCH 28.6 26.0 - 34.0 pg   MCHC 32.7 30.0 - 36.0 g/dL   RDW 02.7 25.3 - 66.4 %   Platelets 225 150 - 400 K/uL   nRBC 0.0 0.0 - 0.2 %  Magnesium     Status: None   Collection Time: 11/21/22  5:10 AM  Result Value Ref Range   Magnesium 2.2 1.7 - 2.4 mg/dL    I have reviewed pertinent nursing notes, vitals, labs, and images as necessary. I have ordered labwork to follow up on as indicated.  I have reviewed the last notes from staff over past 24 hours. I have discussed patient's care plan and test results with nursing  staff, CM/SW, and other staff as appropriate.  Time spent: Greater than 50% of the 55 minute visit was spent in counseling/coordination of care for the patient as laid out in the A&P.   LOS: 1 day   Lewie Chamber, MD [facility] 11/21/2022, 2:33 PM

## 2022-11-21 NOTE — Assessment & Plan Note (Signed)
-   On Plavix and Lipitor

## 2022-11-22 DIAGNOSIS — K5792 Diverticulitis of intestine, part unspecified, without perforation or abscess without bleeding: Secondary | ICD-10-CM | POA: Diagnosis not present

## 2022-11-22 DIAGNOSIS — N179 Acute kidney failure, unspecified: Secondary | ICD-10-CM | POA: Diagnosis not present

## 2022-11-22 DIAGNOSIS — N329 Bladder disorder, unspecified: Secondary | ICD-10-CM | POA: Diagnosis not present

## 2022-11-22 LAB — BASIC METABOLIC PANEL
Anion gap: 6 (ref 5–15)
BUN: 12 mg/dL (ref 8–23)
CO2: 27 mmol/L (ref 22–32)
Calcium: 8.1 mg/dL — ABNORMAL LOW (ref 8.9–10.3)
Chloride: 103 mmol/L (ref 98–111)
Creatinine, Ser: 0.94 mg/dL (ref 0.61–1.24)
GFR, Estimated: >60 mL/min (ref >60)
Glucose, Bld: 96 mg/dL (ref 70–99)
Potassium: 3.1 mmol/L — ABNORMAL LOW (ref 3.5–5.1)
Sodium: 136 mmol/L (ref 135–145)

## 2022-11-22 LAB — CBC WITH DIFFERENTIAL/PLATELET
Abs Immature Granulocytes: 0.03 10*3/uL (ref 0.00–0.07)
Basophils Absolute: 0.1 10*3/uL (ref 0.0–0.1)
Basophils Relative: 1 %
Eosinophils Absolute: 0.3 10*3/uL (ref 0.0–0.5)
Eosinophils Relative: 4 %
HCT: 37.6 % — ABNORMAL LOW (ref 39.0–52.0)
Hemoglobin: 12.3 g/dL — ABNORMAL LOW (ref 13.0–17.0)
Immature Granulocytes: 0 %
Lymphocytes Relative: 24 %
Lymphs Abs: 1.7 10*3/uL (ref 0.7–4.0)
MCH: 28.5 pg (ref 26.0–34.0)
MCHC: 32.7 g/dL (ref 30.0–36.0)
MCV: 87.2 fL (ref 80.0–100.0)
Monocytes Absolute: 1.1 10*3/uL — ABNORMAL HIGH (ref 0.1–1.0)
Monocytes Relative: 15 %
Neutro Abs: 4.2 10*3/uL (ref 1.7–7.7)
Neutrophils Relative %: 56 %
Platelets: 226 10*3/uL (ref 150–400)
RBC: 4.31 MIL/uL (ref 4.22–5.81)
RDW: 13.8 % (ref 11.5–15.5)
WBC: 7.4 10*3/uL (ref 4.0–10.5)
nRBC: 0 % (ref 0.0–0.2)

## 2022-11-22 LAB — MAGNESIUM: Magnesium: 2.1 mg/dL (ref 1.7–2.4)

## 2022-11-22 MED ORDER — POTASSIUM CHLORIDE CRYS ER 20 MEQ PO TBCR
40.0000 meq | EXTENDED_RELEASE_TABLET | Freq: Once | ORAL | Status: AC
  Administered 2022-11-22: 40 meq via ORAL
  Filled 2022-11-22: qty 2

## 2022-11-22 MED ORDER — AMOXICILLIN-POT CLAVULANATE 875-125 MG PO TABS: 1.0000 | ORAL_TABLET | Freq: Two times a day (BID) | ORAL | Status: DC

## 2022-11-22 MED ORDER — AMOXICILLIN-POT CLAVULANATE 875-125 MG PO TABS: 1.0000 | ORAL_TABLET | Freq: Two times a day (BID) | ORAL | 0 refills | Status: AC

## 2022-11-22 NOTE — Progress Notes (Signed)
PIV removed as documented. Tele  removed - [facility] tele notified by this RN. Pt's wife top pick up pt from d/c lounge- pt to bathroom - dressing for d/c to home

## 2022-11-22 NOTE — TOC Transition Note (Signed)
Transition of Care [facility]) - CM/SW Discharge Note   Patient Details  Name: Atlas Shihadeh MRN: [id number] Date of Birth: [birth year]  [facility] of Care [facility]) CM/SW Contact:  Lanier Clam, RN Phone Number: 11/22/2022, 10:12 AM   Clinical Narrative: d/c home no CM needs or orders.      Final next level of care: Home/Self Care Barriers to Discharge: No Barriers Identified   Patient Goals and CMS Choice CMS Medicare.gov Compare Post Acute Care list provided to:: Patient Choice offered to / list presented to : Patient  Discharge Placement                         Discharge Plan and Services Additional resources added to the After Visit Summary for                                       Social Determinants of Health (SDOH) Interventions SDOH Screenings   Food Insecurity: No Food Insecurity (11/20/2022)  Housing: Low Risk  (11/20/2022)  Transportation Needs: No Transportation Needs (11/20/2022)  Utilities: Not At Risk (11/20/2022)  Alcohol Screen: Low Risk  (02/09/2022)  Depression (PHQ2-9): Low Risk  (08/04/2022)  Recent Concern: Depression (PHQ2-9) - High Risk (07/07/2022)  Financial Resource Strain: Low Risk  (07/05/2022)  Physical Activity: Inactive (07/05/2022)  Social Connections: Moderately Isolated (07/05/2022)  Stress: Stress Concern Present (07/05/2022)  Tobacco Use: Medium Risk (11/20/2022)     Readmission Risk Interventions     No data to display

## 2022-11-22 NOTE — Discharge Summary (Signed)
Physician Discharge Summary   William Wheeler ZOX:[id number] DOB: Mar 26, 1950 DOA: 11/20/2022  PCP: Everrett Coombe, DO  Admit date: 11/20/2022 Discharge date: 11/22/2022   Admitted From: Home Disposition:  Home Discharging physician: Lewie Chamber, MD Barriers to discharge: none  Recommendations at discharge: Follow-up with urology, appointment scheduled Needs referral for colonoscopy  Discharge Condition: stable CODE STATUS: Full Diet recommendation:  Diet Orders (From admission, onward)     Start     Ordered   11/22/22 0905  DIET SOFT Room service appropriate? Yes; Fluid consistency: Thin  Diet effective now       Question Answer Comment  Room service appropriate? Yes   Fluid consistency: Thin      11/22/22 0904            Hospital Course: [name] is a 72 yo male with PMH diverticulitis, alcohol use in remission, HTN, CVA who presented with persistent left lower quadrant abdominal pain, loose stools, chills and sweats. He has been on 2 courses of antibiotics since 10/24/2022.  Initially took 1 week course of Augmentin for diverticulitis then after recurrent symptoms was started on a 10-day course of Cipro/Flagyl. He endorsed feeling better after the Cipro/Flagyl course and began eating like normal again.  After this, he began feeling sick after approximately 2 days. CT abdomen/pelvis on admission showed left-sided diverticulitis with marked sigmoid wall thickening and rectal wall thickening.  There was also a noted 15 mm polypoid mucosal lesion in the right bladder wall. WBC was significantly elevated on admission, 24.6.  He was started on Zosyn and admitted for ongoing monitoring. He had good clinical improvement with downtrend in leukocytosis.  Abdominal pain improved and he wished to continue with diet advancement at home.  Diet recommendations were given at discharge as well.   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  Has not required hospitalization or surgery in the past.  Last colonoscopy approximately 10 years ago - per CT: "Marked wall thickening in the sigmoid segment with associated pericolonic edema/. These changes are associated with marked rectal wall thickening. Imaging features raise the question of a left sided infectious/inflammatory colitis. Diverticulitis with secondary colonic wall thickening is also a consideration. Neoplasm considered less likely but not entirely excluded." - s/p 2 courses abx outpatient with some improvement inbetween; Augmentin followed by cipro/flagyl - ate large meals after recent recovery, then felt worse again; informed of need for soft diet for 1-2 weeks at discharge with low fiber - s/p zosyn; continue on Augmentin to complete course again - tolerated CLD but wishes to continue advancement at home; given rapid improvement and patient knowing how to go slower on diet advancement this time around, feel discharge is appropriate -He will follow-up outpatient for GI referral to discuss colonoscopy after recovery  AKI (acute kidney injury) (HCC)-resolved as of 11/22/2022 - baseline creatinine ~ 1 - patient presents with increase in creat >0.3 mg/dL above baseline or creat increase >1.5x baseline presumed to have occurred within past 7 days PTA -Creatinine 1.65 on admission -Suspect prerenal/volume depletion from GI losses -Creatinine improved back to normal prior to discharge  Lesion of urinary bladder - per CT read: "Possible 15 mm polypoid mucosal lesion right bladder wall. Urology consultation recommended. Hematuria protocol CT may prove helpful to further evaluate." - will discuss with urology; patient will be set up for outpatient follow up with [facility] for probable cystoscopy   Hypokalemia - Repleted  History of CVA (cerebrovascular accident) without residual deficits - On Plavix and  Lipitor  Essential hypertension - Losartan/HCTZ on hold in setting of AKI   The patient's acute and chronic medical conditions were treated accordingly. On day of discharge, patient was felt deemed stable for discharge. Patient/family member advised to call PCP or come back to ER if needed.   Principal Diagnosis: Acute diverticulitis  Discharge Diagnoses: Active Hospital Problems   Diagnosis Date Noted   Acute diverticulitis 11/20/2022    Priority: 1.   Lesion of urinary bladder 11/20/2022    Priority: 3.   Hypokalemia 11/20/2022   History of CVA (cerebrovascular accident) without residual deficits 08/13/2019   Essential hypertension 11/01/2018    Resolved Hospital Problems   Diagnosis Date Noted Date Resolved   AKI (acute kidney injury) (HCC) 11/20/2022 11/22/2022    Priority: 2.     Discharge Instructions     Increase activity slowly   Complete by: As directed       Allergies as of 11/22/2022   No Known Allergies      Medication List     TAKE these medications    acetaminophen 325 MG tablet Commonly known as: TYLENOL Take 650 mg by mouth every 6 (six) hours as needed.   amoxicillin-clavulanate 875-125 MG tablet Commonly known as: AUGMENTIN Take 1 tablet by mouth every 12 (twelve) hours for 12 days.   atorvastatin 40 MG tablet Commonly known as: LIPITOR Take 1 tablet (40 mg total) by mouth daily.   clopidogrel 75 MG tablet Commonly known as: PLAVIX Take 1 tablet (75 mg total) by mouth daily.   escitalopram 10 MG tablet Commonly known as: LEXAPRO Take 1 tablet (10 mg total) by mouth daily.   losartan-hydrochlorothiazide 50-12.5 MG tablet Commonly known as: HYZAAR Take 1 tablet by mouth daily.        Follow-up Information     Everrett Coombe, DO. Schedule an appointment as soon as possible for a visit in 1 week(s).   Specialty: Family Medicine Contact information: [address]  [address] [place] [address] [phone]                 No Known Allergies  Consultations:   Procedures:   Discharge Exam: BP 117/81 (BP Location: Right Arm)   Pulse 83   Temp 98 F (36.7 C) (Oral)   Resp 16   Ht 6\' 2"  (1.88 m)   Wt 97.7 kg   SpO2 98%   BMI 27.66 kg/m  Physical Exam Constitutional:      General: He is not in acute distress.    Appearance: Normal appearance.  HENT:     Head: Normocephalic and atraumatic.     Mouth/Throat:     Mouth: Mucous membranes are moist.  Eyes:     Extraocular Movements: Extraocular movements intact.  Cardiovascular:     Rate and Rhythm: Normal rate and regular rhythm.  Pulmonary:     Effort: Pulmonary effort is normal. No respiratory distress.     Breath sounds: Normal breath sounds. No wheezing.  Abdominal:     General: Bowel sounds are normal. There is no distension.     Palpations: Abdomen is soft.     Tenderness: There is abdominal tenderness (significant improvement; minimal at d/c) in the left lower quadrant. There is no guarding or rebound.  Musculoskeletal:        General: Normal range of motion.     Cervical back: Normal range of motion and  neck supple.  Skin:    General: Skin is warm and dry.  Neurological:     General: No focal deficit present.     Mental Status: He is alert.  Psychiatric:        Mood and Affect: Mood normal.        Behavior: Behavior normal.      The results of significant diagnostics from this hospitalization (including imaging, microbiology, ancillary and laboratory) are listed below for reference.   Microbiology: Recent Results (from the past 240 hour(s))  Blood Culture (routine x 2)     Status: None (Preliminary result)   Collection Time: 11/20/22  2:13 PM   Specimen: BLOOD  Result Value Ref Range Status   Specimen Description   Final    BLOOD RIGHT ANTECUBITAL Performed at [facility], [address] [place], [place] [address]    Special Requests   Final    BOTTLES DRAWN AEROBIC AND ANAEROBIC  Blood Culture results may not be optimal due to an excessive volume of blood received in culture bottles Performed at [facility], [address] [place], [place] [address]    Culture   Final    NO GROWTH 2 DAYS Performed at [facility], [address], [place], [place] [address]    Report Status PENDING  Incomplete  Resp panel by RT-PCR (RSV, Flu A&B, Covid) Anterior Nasal Swab     Status: None   Collection Time: 11/20/22  2:14 PM   Specimen: Anterior Nasal Swab  Result Value Ref Range Status   SARS Coronavirus 2 by RT PCR NEGATIVE NEGATIVE Final    Comment: (NOTE) SARS-CoV-2 target nucleic acids are NOT DETECTED.  The SARS-CoV-2 RNA is generally detectable in upper respiratory specimens during the acute phase of infection. The lowest concentration of SARS-CoV-2 viral copies this assay can detect is 138 copies/mL. A negative result does not preclude SARS-Cov-2 infection and should not be used as the sole basis for treatment or other patient management decisions. A negative result may occur with  improper specimen collection/handling, submission of specimen other than nasopharyngeal swab, presence of viral mutation(s) within the areas targeted by this assay, and inadequate number of viral copies(<138 copies/mL). A negative result must be combined with clinical observations, patient history, and epidemiological information. The expected result is Negative.  Fact Sheet for Patients:  BloggerCourse.com  Fact Sheet for Healthcare Providers:  SeriousBroker.it  This test is no t yet approved or cleared by the [place] FDA and  has been authorized for detection and/or diagnosis of SARS-CoV-2 by FDA under an Emergency Use Authorization (EUA). This EUA will remain  in effect (meaning this test can be used) for the duration of the COVID-19 declaration under Section 564(b)(1) of the Act, 21 U.S.C.section  360bbb-3(b)(1), unless the authorization is terminated  or revoked sooner.       Influenza A by PCR NEGATIVE NEGATIVE Final   Influenza B by PCR NEGATIVE NEGATIVE Final    Comment: (NOTE) The Xpert Xpress SARS-CoV-2/FLU/RSV plus assay is intended as an aid in the diagnosis of influenza from Nasopharyngeal swab specimens and should not be used as a sole basis for treatment. Nasal washings and aspirates are unacceptable for Xpert Xpress SARS-CoV-2/FLU/RSV testing.  Fact Sheet for Patients: BloggerCourse.com  Fact Sheet for Healthcare Providers: SeriousBroker.it  This test is not yet approved or cleared by the [place] FDA and has been authorized for detection and/or diagnosis of SARS-CoV-2 by FDA under an Emergency Use  Authorization (EUA). This EUA will remain in effect (meaning this test can be used) for the duration of the COVID-19 declaration under Section 564(b)(1) of the Act, 21 U.S.C. section 360bbb-3(b)(1), unless the authorization is terminated or revoked.     Resp Syncytial Virus by PCR NEGATIVE NEGATIVE Final    Comment: (NOTE) Fact Sheet for Patients: BloggerCourse.com  Fact Sheet for Healthcare Providers: SeriousBroker.it  This test is not yet approved or cleared by the [place] FDA and has been authorized for detection and/or diagnosis of SARS-CoV-2 by FDA under an Emergency Use Authorization (EUA). This EUA will remain in effect (meaning this test can be used) for the duration of the COVID-19 declaration under Section 564(b)(1) of the Act, 21 U.S.C. section 360bbb-3(b)(1), unless the authorization is terminated or revoked.  Performed at [facility], [address] [place], [place] [address]      Labs: BNP (last 3 results) No results for input(s): "BNP" in the last 8760 hours. Basic Metabolic Panel: Recent Labs  Lab  11/20/22 1300 11/21/22 0510 11/22/22 0512  NA 138 134* 136  K 3.1* 3.2* 3.1*  CL 100 101 103  CO2 23 25 27   GLUCOSE 134* 102* 96  BUN 28* 18 12  CREATININE 1.65* 1.11 0.94  CALCIUM 8.7* 7.9* 8.1*  MG 2.0 2.2 2.1   Liver Function Tests: Recent Labs  Lab 11/20/22 1300  AST 14*  ALT 16  ALKPHOS 27*  BILITOT 1.1  PROT 7.6  ALBUMIN 4.1   Recent Labs  Lab 11/20/22 1300  LIPASE 21   No results for input(s): "AMMONIA" in the last 168 hours. CBC: Recent Labs  Lab 11/20/22 1300 11/21/22 0510 11/22/22 0512  WBC 24.6* 12.9* 7.4  NEUTROABS 20.2*  --  4.2  HGB 15.0 12.4* 12.3*  HCT 44.4 37.9* 37.6*  MCV 87.1 87.5 87.2  PLT 299 225 226   Cardiac Enzymes: No results for input(s): "CKTOTAL", "CKMB", "CKMBINDEX", "TROPONINI" in the last 168 hours. BNP: Invalid input(s): "POCBNP" CBG: No results for input(s): "GLUCAP" in the last 168 hours. D-Dimer No results for input(s): "DDIMER" in the last 72 hours. Hgb A1c No results for input(s): "HGBA1C" in the last 72 hours. Lipid Profile No results for input(s): "CHOL", "HDL", "LDLCALC", "TRIG", "CHOLHDL", "LDLDIRECT" in the last 72 hours. Thyroid function studies No results for input(s): "TSH", "T4TOTAL", "T3FREE", "THYROIDAB" in the last 72 hours.  Invalid input(s): "FREET3" Anemia work up No results for input(s): "VITAMINB12", "FOLATE", "FERRITIN", "TIBC", "IRON", "RETICCTPCT" in the last 72 hours. Urinalysis    Component Value Date/Time   COLORURINE YELLOW 11/20/2022 1526   APPEARANCEUR CLEAR 11/20/2022 1526   LABSPEC >1.046 (H) 11/20/2022 1526   PHURINE 6.0 11/20/2022 1526   GLUCOSEU NEGATIVE 11/20/2022 1526   HGBUR SMALL (A) 11/20/2022 1526   BILIRUBINUR NEGATIVE 11/20/2022 1526   KETONESUR NEGATIVE 11/20/2022 1526   PROTEINUR 30 (A) 11/20/2022 1526   NITRITE NEGATIVE 11/20/2022 1526   LEUKOCYTESUR NEGATIVE 11/20/2022 1526   Sepsis Labs Recent Labs  Lab 11/20/22 1300 11/21/22 0510 11/22/22 0512  WBC 24.6*  12.9* 7.4   Microbiology Recent Results (from the past 240 hour(s))  Blood Culture (routine x 2)     Status: None (Preliminary result)   Collection Time: 11/20/22  2:13 PM   Specimen: BLOOD  Result Value Ref Range Status   Specimen Description   Final    BLOOD RIGHT ANTECUBITAL Performed at [facility], [address] [place], [place] [address]    Special Requests  Final    BOTTLES DRAWN AEROBIC AND ANAEROBIC Blood Culture results may not be optimal due to an excessive volume of blood received in culture bottles Performed at [facility], [address], [place], [place] [address]    Culture   Final    NO GROWTH 2 DAYS Performed at [facility], [address], [place], [place] [address]    Report Status PENDING  Incomplete  Resp panel by RT-PCR (RSV, Flu A&B, Covid) Anterior Nasal Swab     Status: None   Collection Time: 11/20/22  2:14 PM   Specimen: Anterior Nasal Swab  Result Value Ref Range Status   SARS Coronavirus 2 by RT PCR NEGATIVE NEGATIVE Final    Comment: (NOTE) SARS-CoV-2 target nucleic acids are NOT DETECTED.  The SARS-CoV-2 RNA is generally detectable in upper respiratory specimens during the acute phase of infection. The lowest concentration of SARS-CoV-2 viral copies this assay can detect is 138 copies/mL. A negative result does not preclude SARS-Cov-2 infection and should not be used as the sole basis for treatment or other patient management decisions. A negative result may occur with  improper specimen collection/handling, submission of specimen other than nasopharyngeal swab, presence of viral mutation(s) within the areas targeted by this assay, and inadequate number of viral copies(<138 copies/mL). A negative result must be combined with clinical observations, patient history, and epidemiological information. The expected result is Negative.  Fact Sheet for Patients:   BloggerCourse.com  Fact Sheet for Healthcare Providers:  SeriousBroker.it  This test is no t yet approved or cleared by the [place] FDA and  has been authorized for detection and/or diagnosis of SARS-CoV-2 by FDA under an Emergency Use Authorization (EUA). This EUA will remain  in effect (meaning this test can be used) for the duration of the COVID-19 declaration under Section 564(b)(1) of the Act, 21 U.S.C.section 360bbb-3(b)(1), unless the authorization is terminated  or revoked sooner.       Influenza A by PCR NEGATIVE NEGATIVE Final   Influenza B by PCR NEGATIVE NEGATIVE Final    Comment: (NOTE) The Xpert Xpress SARS-CoV-2/FLU/RSV plus assay is intended as an aid in the diagnosis of influenza from Nasopharyngeal swab specimens and should not be used as a sole basis for treatment. Nasal washings and aspirates are unacceptable for Xpert Xpress SARS-CoV-2/FLU/RSV testing.  Fact Sheet for Patients: BloggerCourse.com  Fact Sheet for Healthcare Providers: SeriousBroker.it  This test is not yet approved or cleared by the [place] FDA and has been authorized for detection and/or diagnosis of SARS-CoV-2 by FDA under an Emergency Use Authorization (EUA). This EUA will remain in effect (meaning this test can be used) for the duration of the COVID-19 declaration under Section 564(b)(1) of the Act, 21 U.S.C. section 360bbb-3(b)(1), unless the authorization is terminated or revoked.     Resp Syncytial Virus by PCR NEGATIVE NEGATIVE Final    Comment: (NOTE) Fact Sheet for Patients: BloggerCourse.com  Fact Sheet for Healthcare Providers: SeriousBroker.it  This test is not yet approved or cleared by the [place] FDA and has been authorized for detection and/or diagnosis of SARS-CoV-2 by FDA under an Emergency Use  Authorization (EUA). This EUA will remain in effect (meaning this test can be used) for the duration of the COVID-19 declaration under Section 564(b)(1) of the Act, 21 U.S.C. section 360bbb-3(b)(1), unless the authorization is terminated or revoked.  Performed at [facility], [address] [place], [place] [address]     Procedures/Studies: [facility] Chest  Port 1 View  Result Date: 11/20/2022 CLINICAL DATA:  Questionable sepsis - evaluate for abnormality EXAM: PORTABLE CHEST - 1 VIEW COMPARISON:  None Available. FINDINGS: Cardiac silhouette is unremarkable. No pneumothorax or pleural effusion. The lungs are clear. The visualized skeletal structures are unremarkable. IMPRESSION: No acute cardiopulmonary process. Electronically Signed   By: Layla Maw M.D.   On: 11/20/2022 13:52   CT ABDOMEN PELVIS W CONTRAST  Result Date: 11/20/2022 CLINICAL DATA:  Left lower quadrant abdominal pain. EXAM: CT ABDOMEN AND PELVIS WITH CONTRAST TECHNIQUE: Multidetector CT imaging of the abdomen and pelvis was performed using the standard protocol following bolus administration of intravenous contrast. RADIATION DOSE REDUCTION: This exam was performed according to the departmental dose-optimization program which includes automated exposure control, adjustment of the mA and/or kV according to patient size and/or use of iterative reconstruction technique. CONTRAST:  OMNIPAQUE IOHEXOL 300 MG/ML  SOLN COMPARISON:  11/01/2022 FINDINGS: Lower chest: Unremarkable. Hepatobiliary: No suspicious focal abnormality within the liver parenchyma. There is no evidence for gallstones, gallbladder wall thickening, or pericholecystic fluid. No intrahepatic or extrahepatic biliary dilation. Pancreas: No focal mass lesion. No dilatation of the main duct. No intraparenchymal cyst. No peripancreatic edema. Spleen: No splenomegaly. No suspicious focal mass lesion. Adrenals/Urinary Tract: No adrenal nodule or mass.  Tiny well-defined homogeneous low-density lesions in both kidneys are too small to characterize but are statistically most likely benign and probably cysts. No followup imaging is recommended. No evidence for hydroureter. The urinary bladder appears normal for the degree of distention. Enhancing soft tissue at the bladder base likely reflects median lobe hypertrophy, similar to prior. Possible 15 mm polypoid mucosal lesion right bladder wall (axial image 82/2). Stomach/Bowel: Stomach is unremarkable. No gastric wall thickening. No evidence of outlet obstruction. Duodenum is normally positioned as is the ligament of Treitz. No small bowel wall thickening. No small bowel dilatation. The terminal ileum is normal. The appendix is normal. Diverticular disease noted left colon with marked wall thickening in the sigmoid segment and pericolonic edema/inflammation along the sigmoid colon. This is associated with marked rectal wall thickening (see axial 83/2). Vascular/Lymphatic: There is mild atherosclerotic calcification of the abdominal aorta without aneurysm. There is no gastrohepatic or hepatoduodenal ligament lymphadenopathy. No retroperitoneal or mesenteric lymphadenopathy. No pelvic sidewall lymphadenopathy. Tiny lymph nodes are seen in the sigmoid mesocolon including 7 mm round node on image 58/2 adjacent to the superior rectal vein. 6 mm lymph node is identified adjacent to the IMA origin. Reproductive: The prostate gland and seminal vesicles are unremarkable. As above, there appears to be median lobe hypertrophy generating mass-effect on the bladder base. Other: No intraperitoneal free fluid. Musculoskeletal: Status post right groin herniorrhaphy. No worrisome lytic or sclerotic osseous abnormality. IMPRESSION: 1. Marked wall thickening in the sigmoid segment with associated pericolonic edema/. These changes are associated with marked rectal wall thickening. Imaging features raise the question of a left sided  infectious/inflammatory colitis. Diverticulitis with secondary colonic wall thickening is also a consideration. Neoplasm considered less likely but not entirely excluded. 2. Tiny lymph nodes in the sigmoid mesocolon are likely reactive. 3. Possible 15 mm polypoid mucosal lesion right bladder wall. Urology consultation recommended. Hematuria protocol CT may prove helpful to further evaluate. 4. Enhancing soft tissue identified in the bladder base adjacent to the prostate gland, likely secondary to median lobe hypertrophy. This could also be assessed at the time of urology follow-up. 5.  Aortic Atherosclerosis (ICD10-I70.0). Electronically Signed   By: Kennith Center M.D.   On:  11/20/2022 13:50   CT ABDOMEN PELVIS W CONTRAST  Result Date: 11/01/2022 CLINICAL DATA:  Left lower quadrant abdominal pain. EXAM: CT ABDOMEN AND PELVIS WITH CONTRAST TECHNIQUE: Multidetector CT imaging of the abdomen and pelvis was performed using the standard protocol following bolus administration of intravenous contrast. RADIATION DOSE REDUCTION: This exam was performed according to the departmental dose-optimization program which includes automated exposure control, adjustment of the mA and/or kV according to patient size and/or use of iterative reconstruction technique. CONTRAST:  OMNIPAQUE IOHEXOL 300 MG/ML  SOLN COMPARISON:  None Available. FINDINGS: Lower chest: The visualized lung bases are clear. There is coronary vascular calcification. No intra-abdominal free air or free fluid. Hepatobiliary: The liver is unremarkable. No biliary ductal dilatation. The gallbladder is unremarkable. Pancreas: Unremarkable. No pancreatic ductal dilatation or surrounding inflammatory changes. Spleen: Normal in size without focal abnormality. Adrenals/Urinary Tract: The adrenal glands are unremarkable. Small left renal upper pole cyst. There is no hydronephrosis on either side. There is symmetric enhancement and excretion of contrast by both  kidneys. The visualized ureters and urinary bladder appear unremarkable. Stomach/Bowel: There is sigmoid diverticulosis with muscular hypertrophy. There is diffuse perisigmoid haziness and stranding consistent with acute diverticulitis. No diverticular abscess or perforation. Additional scattered colonic diverticula. There is no bowel obstruction. The appendix is normal. Vascular/Lymphatic: Mild aortoiliac atherosclerotic disease. The IVC is unremarkable. No portal venous gas. There is no adenopathy. Reproductive: The prostate mildly enlarged prostate gland with median lobe hypertrophy indenting the base of the bladder. The seminal vesicles are symmetric. Other: Right inguinal hernia repair. Small fat containing left inguinal and umbilical hernias. Musculoskeletal: Mild degenerative changes. No acute osseous pathology. IMPRESSION: 1. Acute sigmoid diverticulitis. No diverticular abscess or perforation. 2.  Aortic Atherosclerosis (ICD10-I70.0). Electronically Signed   By: Elgie Collard M.D.   On: 11/01/2022 19:44     Time coordinating discharge: Over 30 minutes    Lewie Chamber, MD  [facility] 11/22/2022, 3:04 PM

## 2022-11-22 NOTE — Discharge Instructions (Signed)
Eat a low fiber diet for about 2 weeks then slowly increase fiber intake

## 2022-11-25 ENCOUNTER — Other Ambulatory Visit: Payer: Self-pay | Admitting: Family Medicine

## 2022-11-25 ENCOUNTER — Telehealth: Payer: Self-pay

## 2022-11-25 DIAGNOSIS — R454 Irritability and anger: Secondary | ICD-10-CM

## 2022-11-25 LAB — CULTURE, BLOOD (ROUTINE X 2): Culture: NO GROWTH

## 2022-11-25 NOTE — Transitions of Care (Post Inpatient/ED Visit) (Signed)
11/25/2022  Name: Marcquis Mallia MRN: [id number] DOB: [birth year]  Today's TOC FU Call Status: Today's TOC FU Call Status:: Successful TOC FU Call Completed TOC FU Call Complete Date: 11/25/22 Patient's Name and Date of Birth confirmed.  Transition Care Management Follow-up Telephone Call Date of Discharge: 11/22/22 Discharge Facility: [facility]) Type of Discharge: Inpatient Admission Primary Inpatient Discharge Diagnosis:: Acute diverticulitis How have you been since you were released from the hospital?: Better Any questions or concerns?: No  Items Reviewed: Did you receive and understand the discharge instructions provided?: Yes Medications obtained,verified, and reconciled?: Yes (Medications Reviewed) Any new allergies since your discharge?: No Dietary orders reviewed?: Yes Type of Diet Ordered:: advance as tolerated Do you have support at home?: Yes People in Home: spouse  Medications Reviewed Today: Medications Reviewed Today     Reviewed by Earlie Server, RN (Registered Nurse) on 11/25/22 at 1544  Med List Status: <None>   Medication Order Taking? Sig Documenting Provider Last Dose Status Informant  acetaminophen (TYLENOL) 325 MG tablet [id number] No Take 650 mg by mouth every 6 (six) hours as needed.  Patient not taking: Reported on 11/25/2022   [provider] Not Taking Active Self, Pharmacy Records  amoxicillin-clavulanate (AUGMENTIN) 875-125 MG tablet [id number] Yes Take 1 tablet by mouth every 12 (twelve) hours for 12 days. Lewie Chamber, MD Taking Active   atorvastatin (LIPITOR) 40 MG tablet [id number] Yes Take 1 tablet (40 mg total) by mouth daily. Everrett Coombe, DO Taking Active Self, Pharmacy Records  cholecalciferol (VITAMIN D3) 25 MCG (1000 UNIT) tablet [id number] Yes Take 1,000 Units by mouth daily. [provider] Taking Active   clopidogrel (PLAVIX) 75 MG tablet [id number] Yes Take 1 tablet (75 mg total) by mouth daily. Everrett Coombe,  DO Taking Active Self, Pharmacy Records  escitalopram (LEXAPRO) 10 MG tablet [id number] Yes Take 1 tablet (10 mg total) by mouth daily. Everrett Coombe, DO Taking Active Self, Pharmacy Records  losartan-hydrochlorothiazide [facility]) 50-12.5 MG tablet [id number] Yes Take 1 tablet by mouth daily. Everrett Coombe, DO Taking Active Self, Pharmacy Records  Omega-3 Fatty Acids (FISH OIL) 1000 MG CAPS [id number] Yes Take 1 capsule by mouth daily at 6 (six) AM. [provider] Taking Active             Home Care and Equipment/Supplies: Were Home Health Services Ordered?: No Any new equipment or medical supplies ordered?: No  Functional Questionnaire: Do you need assistance with bathing/showering or dressing?: No Do you need assistance with meal preparation?: No Do you need assistance with eating?: No Do you have difficulty maintaining continence: No Do you need assistance with getting out of bed/getting out of a chair/moving?: No Do you have difficulty managing or taking your medications?: No  Follow up appointments reviewed: PCP Follow-up appointment confirmed?: Yes Date of PCP follow-up appointment?: 12/01/22 Follow-up Provider: [facility] Follow-up appointment confirmed?: No Reason Specialist Follow-Up Not Confirmed: Patient has Specialist Provider Number and will Call for Appointment Do you need transportation to your follow-up appointment?: No Do you understand care options if your condition(s) worsen?: Yes-patient verbalized understanding  SDOH Interventions Today    Flowsheet Row Most Recent Value  SDOH Interventions   Food Insecurity Interventions Intervention Not Indicated  Transportation Interventions Intervention Not Indicated      Interventions Today    Flowsheet Row Most Recent Value  Chronic Disease   Chronic disease during today's visit Other  [Diverticilitis]  General Interventions   General Interventions Discussed/Reviewed General  Interventions Discussed  Exercise Interventions   Exercise Discussed/Reviewed Physical Activity  [reviewed activitiy as tolerated]  Education Interventions   Education Provided Provided Education  Provided Verbal Education On Nutrition, When to see the doctor, Medication, Exercise  Nutrition Interventions   Nutrition Discussed/Reviewed Nutrition Discussed  Pharmacy Interventions   Pharmacy Dicussed/Reviewed Medications and their functions       Lonia Chimera, Charity fundraiser, Scientist, research (physical sciences), CEN [facility] [phone]

## 2022-11-30 ENCOUNTER — Inpatient Hospital Stay: Payer: Medicare HMO | Admitting: Family Medicine

## 2022-12-06 ENCOUNTER — Ambulatory Visit: Payer: Medicare HMO | Admitting: Family Medicine

## 2022-12-06 ENCOUNTER — Encounter: Payer: Self-pay | Admitting: Family Medicine

## 2022-12-06 VITALS — BP 118/68 | HR 60 | Ht 74.0 in | Wt 217.0 lb

## 2022-12-06 DIAGNOSIS — K5792 Diverticulitis of intestine, part unspecified, without perforation or abscess without bleeding: Secondary | ICD-10-CM | POA: Diagnosis not present

## 2022-12-06 NOTE — Progress Notes (Signed)
William Wheeler - 72 y.o. male MRN [id number]  Date of birth: [birth year]  Subjective Chief Complaint  Patient presents with   Hospitalization Follow-up   Diverticulitis    HPI William Wheeler is a 72 y.o. here today for follow up here today for hospital follow up.   Recent admission for diverticulitis.  Initially treated with Cipro/flagyl.  Felt pretty good initially however, symptoms worsened again.  Treated with zosyn during hospitalization.  Reports that he still does not feel back to baseline.  Bowel movements are still not quite right.  He feels like he needs to go but  only a small amount comes out.  Denies fever, chills, blood in stool.   Incidentally noted on have lesion of bladder as well.  He has been referred to urology.  Denies urinary symptoms.  ROS:  A comprehensive ROS was completed and negative except as noted per HPI  No Known Allergies  Past Medical History:  Diagnosis Date   Alcohol abuse    Alcohol addiction (HCC)    Colon polyps    Diverticulitis    Diverticulitis    Hypercholesteremia    Hypertension    Skin cancer    Stroke [facility])     Past Surgical History:  Procedure Laterality Date   broken bone repair     Nose   HERNIA REPAIR     skin cancer removed  10/2021   forehead    Social History   Socioeconomic History   Marital status: Married    Spouse name: Yosiah Jasmin   Number of children: 2   Years of education: some college   Highest education level: Some college, no degree  Occupational History   Occupation: Scientist, research (physical sciences)  Tobacco Use   Smoking status: Former    Current packs/day: 0.00    Average packs/day: 2.5 packs/day for 29.0 years (72.5 ttl pk-yrs)    Types: Cigarettes    Start date: 49    Quit date: 2001    Years since quitting: 23.7   Smokeless tobacco: Never  Vaping Use   Vaping status: Never Used  Substance and Sexual Activity   Alcohol use: Not Currently   Drug use: Not Currently    Types: Marijuana   Sexual  activity: Not Currently    Partners: Female  Other Topics Concern   Not on file  Social History Narrative   Lives at home with his wife and two children. 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   Social Determinants of Health   Financial Resource Strain: Low Risk  (07/05/2022)   Overall Financial Resource Strain (CARDIA)    Difficulty of Paying Living Expenses: Not hard at all  Food Insecurity: No Food Insecurity (11/25/2022)   Hunger Vital Sign    Worried About Running Out of Food in the Last Year: Never true    Ran Out of Food in the Last Year: Never true  Transportation Needs: No Transportation Needs (11/25/2022)   PRAPARE - Administrator, Civil Service (Medical): No    Lack of Transportation (Non-Medical): No  Physical Activity: Inactive (07/05/2022)   Exercise Vital Sign    Days of Exercise per Week: 0 days    Minutes of Exercise per Session: 0 min  Stress: Stress Concern Present (07/05/2022)   [employer] of Occupational Health - Occupational Stress Questionnaire    Feeling of Stress : Rather much  Social Connections: Moderately Isolated (07/05/2022)   Social Connection and Isolation Panel [NHANES]    Frequency of  Communication with Friends and Family: Once a week    Frequency of Social Gatherings with Friends and Family: Never    Attends Religious Services: More than 4 times per year    Active Member of Clubs or Organizations: No    Attends Banker Meetings: Never    Marital Status: Married    Family History  Problem Relation Age of Onset   Leukemia Mother    Non-Hodgkin's lymphoma Mother    Other Father        unsure of medical history    Health Maintenance  Topic Date Due   DTaP/Tdap/Td (1 - Tdap) Never done   Zoster Vaccines- Shingrix (2 of 2) 01/07/2023 (Originally 10/12/2019)   COVID-19 Vaccine (5 - 2023-24 season) 01/22/2023 (Originally 11/06/2022)   Hepatitis C Screening  02/04/2023 (Originally 02/11/1971)   Pneumonia Vaccine 65+ Years  old (1 of 2 - PCV) 02/10/2023 (Originally 02/11/1959)   INFLUENZA VACCINE  06/05/2023 (Originally 10/06/2022)   Medicare Annual Wellness (AWV)  02/10/2023   Fecal DNA (Cologuard)  06/02/2023   HPV VACCINES  Aged Out     ----------------------------------------------------------------------------------------------------------------------------------------------------------------------------------------------------------------- Physical Exam BP 118/68 (BP Location: Left Arm, Patient Position: Sitting, Cuff Size: Normal)   Pulse 60   Ht 6\' 2"  (1.88 m)   Wt 217 lb (98.4 kg)   SpO2 98%   BMI 27.86 kg/m   Physical Exam Constitutional:      Appearance: Normal appearance.  HENT:     Head: Normocephalic and atraumatic.  Eyes:     General: No scleral icterus. Cardiovascular:     Rate and Rhythm: Normal rate and regular rhythm.  Pulmonary:     Effort: Pulmonary effort is normal.     Breath sounds: Normal breath sounds.  Abdominal:     General: Abdomen is flat. There is no distension.     Palpations: Abdomen is soft.     Tenderness: There is no abdominal tenderness.  Musculoskeletal:     Cervical back: Neck supple.  Neurological:     Mental Status: He is alert.  Psychiatric:        Mood and Affect: Mood normal.        Behavior: Behavior normal.     ------------------------------------------------------------------------------------------------------------------------------------------------------------------------------------------------------------------- Assessment and Plan  Acute diverticulitis He continues to have symptoms of diverticulitis.  Rechecking CBC and CMP today.  May need to repeat imaging if he remains symptomatic.  GI referral placed as well.   No orders of the defined types were placed in this encounter.   No follow-ups on file.    This visit occurred during the SARS-CoV-2 public health emergency.  Safety protocols were in place, including screening  questions prior to the visit, additional usage of staff PPE, and extensive cleaning of exam room while observing appropriate contact time as indicated for disinfecting solutions.

## 2022-12-06 NOTE — Patient Instructions (Signed)
Try adding florastor probiotic.   If symptoms worsen let me know

## 2022-12-07 LAB — CBC WITH DIFFERENTIAL/PLATELET
Basophils Absolute: 0.1 10*3/uL (ref 0.0–0.2)
Basos: 1 %
EOS (ABSOLUTE): 0.1 10*3/uL (ref 0.0–0.4)
Eos: 1 %
Hematocrit: 35.8 % — ABNORMAL LOW (ref 37.5–51.0)
Hemoglobin: 11.8 g/dL — ABNORMAL LOW (ref 13.0–17.7)
Immature Grans (Abs): 0.1 10*3/uL (ref 0.0–0.1)
Immature Granulocytes: 1 %
Lymphocytes Absolute: 1.7 10*3/uL (ref 0.7–3.1)
Lymphs: 15 %
MCH: 29.7 pg (ref 26.6–33.0)
MCHC: 33 g/dL (ref 31.5–35.7)
MCV: 90 fL (ref 79–97)
Monocytes Absolute: 1.2 10*3/uL — ABNORMAL HIGH (ref 0.1–0.9)
Monocytes: 11 %
Neutrophils Absolute: 7.8 10*3/uL — ABNORMAL HIGH (ref 1.4–7.0)
Neutrophils: 71 %
Platelets: 269 10*3/uL (ref 150–450)
RBC: 3.97 x10E6/uL — ABNORMAL LOW (ref 4.14–5.80)
RDW: 13.3 % (ref 11.6–15.4)
WBC: 10.9 10*3/uL — ABNORMAL HIGH (ref 3.4–10.8)

## 2022-12-07 LAB — CMP14+EGFR
ALT: 12 [IU]/L (ref 0–44)
AST: 14 [IU]/L (ref 0–40)
Albumin: 3.9 g/dL (ref 3.9–4.9)
Alkaline Phosphatase: 38 [IU]/L — ABNORMAL LOW (ref 44–121)
BUN/Creatinine Ratio: 15 (ref 10–24)
BUN: 15 mg/dL (ref 8–27)
Bilirubin Total: 0.4 mg/dL (ref 0.0–1.2)
CO2: 22 mmol/L (ref 20–29)
Calcium: 9.1 mg/dL (ref 8.6–10.2)
Chloride: 102 mmol/L (ref 96–106)
Creatinine, Ser: 0.98 mg/dL (ref 0.76–1.27)
Globulin, Total: 2.4 g/dL (ref 1.5–4.5)
Glucose: 77 mg/dL (ref 70–99)
Potassium: 3.8 mmol/L (ref 3.5–5.2)
Sodium: 140 mmol/L (ref 134–144)
Total Protein: 6.3 g/dL (ref 6.0–8.5)
eGFR: 83 mL/min/{1.73_m2} (ref >59)

## 2022-12-09 ENCOUNTER — Telehealth: Payer: Self-pay | Admitting: Family Medicine

## 2022-12-09 NOTE — Telephone Encounter (Signed)
Patient is calling wanting lab results notes in Mychart. He wants to know what's going on.

## 2022-12-11 ENCOUNTER — Encounter: Payer: Self-pay | Admitting: Family Medicine

## 2022-12-11 DIAGNOSIS — D72829 Elevated white blood cell count, unspecified: Secondary | ICD-10-CM

## 2022-12-11 NOTE — Assessment & Plan Note (Addendum)
He continues to have symptoms of diverticulitis.  Rechecking CBC and CMP today.  May need to repeat imaging if he remains symptomatic.  GI referral placed as well.

## 2022-12-12 MED ORDER — AMOXICILLIN-POT CLAVULANATE 875-125 MG PO TABS: 1.0000 | ORAL_TABLET | Freq: Two times a day (BID) | ORAL | 0 refills | Status: AC

## 2022-12-14 NOTE — Telephone Encounter (Signed)
Spoke with patient . He had seen his Mychart message  but felt that the message took too long to respond and felt that what he felt was a very serious matter was not being taken seriously.

## 2022-12-16 ENCOUNTER — Other Ambulatory Visit: Payer: Self-pay | Admitting: Podiatry

## 2022-12-19 DIAGNOSIS — D72829 Elevated white blood cell count, unspecified: Secondary | ICD-10-CM | POA: Diagnosis not present

## 2022-12-20 LAB — CBC WITH DIFFERENTIAL/PLATELET
Basophils Absolute: 0.1 10*3/uL (ref 0.0–0.2)
Basos: 1 %
EOS (ABSOLUTE): 0.2 10*3/uL (ref 0.0–0.4)
Eos: 2 %
Hematocrit: 39.2 % (ref 37.5–51.0)
Hemoglobin: 12.7 g/dL — ABNORMAL LOW (ref 13.0–17.7)
Immature Grans (Abs): 0.1 10*3/uL (ref 0.0–0.1)
Immature Granulocytes: 1 %
Lymphocytes Absolute: 2.4 10*3/uL (ref 0.7–3.1)
Lymphs: 21 %
MCH: 29 pg (ref 26.6–33.0)
MCHC: 32.4 g/dL (ref 31.5–35.7)
MCV: 90 fL (ref 79–97)
Monocytes Absolute: 1 10*3/uL — ABNORMAL HIGH (ref 0.1–0.9)
Monocytes: 8 %
Neutrophils Absolute: 7.6 10*3/uL — ABNORMAL HIGH (ref 1.4–7.0)
Neutrophils: 67 %
Platelets: 319 10*3/uL (ref 150–450)
RBC: 4.38 x10E6/uL (ref 4.14–5.80)
RDW: 13.4 % (ref 11.6–15.4)
WBC: 11.3 10*3/uL — ABNORMAL HIGH (ref 3.4–10.8)

## 2022-12-21 ENCOUNTER — Other Ambulatory Visit: Payer: Self-pay | Admitting: Family Medicine

## 2022-12-21 DIAGNOSIS — K5792 Diverticulitis of intestine, part unspecified, without perforation or abscess without bleeding: Secondary | ICD-10-CM

## 2022-12-21 MED ORDER — METRONIDAZOLE 500 MG PO TABS: 500.0000 mg | ORAL_TABLET | Freq: Three times a day (TID) | ORAL | 0 refills | Status: AC

## 2022-12-21 MED ORDER — CIPROFLOXACIN HCL 500 MG PO TABS: 500.0000 mg | ORAL_TABLET | Freq: Two times a day (BID) | ORAL | 0 refills | Status: AC

## 2022-12-23 ENCOUNTER — Encounter: Payer: Self-pay | Admitting: Gastroenterology

## 2022-12-26 ENCOUNTER — Ambulatory Visit: Payer: Medicare HMO

## 2022-12-26 DIAGNOSIS — K5792 Diverticulitis of intestine, part unspecified, without perforation or abscess without bleeding: Secondary | ICD-10-CM | POA: Diagnosis not present

## 2022-12-26 DIAGNOSIS — K5730 Diverticulosis of large intestine without perforation or abscess without bleeding: Secondary | ICD-10-CM | POA: Diagnosis not present

## 2022-12-26 DIAGNOSIS — C678 Malignant neoplasm of overlapping sites of bladder: Secondary | ICD-10-CM | POA: Diagnosis not present

## 2022-12-26 MED ORDER — IOHEXOL 9 MG/ML PO SOLN: 500.0000 mL | ORAL | Status: AC

## 2022-12-26 MED ORDER — IOHEXOL 300 MG/ML  SOLN
100.0000 mL | Freq: Once | INTRAMUSCULAR | Status: AC | PRN
  Administered 2022-12-26: 100 mL via INTRAVENOUS

## 2023-01-05 NOTE — Telephone Encounter (Signed)
I called patient and scheduled him for tomorrow. [facility] and had them change to a stat read.

## 2023-01-05 NOTE — Telephone Encounter (Signed)
Please have priority of CT scan from 10/21 moved up to be read.  Let's get him in tomorrow morning.  Thanks!  CM

## 2023-01-06 ENCOUNTER — Ambulatory Visit (INDEPENDENT_AMBULATORY_CARE_PROVIDER_SITE_OTHER): Payer: Medicare HMO | Admitting: Family Medicine

## 2023-01-06 ENCOUNTER — Encounter: Payer: Self-pay | Admitting: Family Medicine

## 2023-01-06 DIAGNOSIS — K5792 Diverticulitis of intestine, part unspecified, without perforation or abscess without bleeding: Secondary | ICD-10-CM

## 2023-01-06 DIAGNOSIS — R197 Diarrhea, unspecified: Secondary | ICD-10-CM

## 2023-01-06 MED ORDER — AMOXICILLIN-POT CLAVULANATE 875-125 MG PO TABS: 1.0000 | ORAL_TABLET | Freq: Two times a day (BID) | ORAL | 0 refills | Status: DC

## 2023-01-07 LAB — CMP14+EGFR
ALT: 12 [IU]/L (ref 0–44)
AST: 16 [IU]/L (ref 0–40)
Albumin: 4.3 g/dL (ref 3.9–4.9)
Alkaline Phosphatase: 48 [IU]/L (ref 44–121)
BUN/Creatinine Ratio: 17 (ref 10–24)
BUN: 24 mg/dL (ref 8–27)
Bilirubin Total: 1 mg/dL (ref 0.0–1.2)
CO2: 20 mmol/L (ref 20–29)
Calcium: 9.5 mg/dL (ref 8.6–10.2)
Chloride: 97 mmol/L (ref 96–106)
Creatinine, Ser: 1.41 mg/dL — ABNORMAL HIGH (ref 0.76–1.27)
Globulin, Total: 2.8 g/dL (ref 1.5–4.5)
Glucose: 95 mg/dL (ref 70–99)
Potassium: 3.5 mmol/L (ref 3.5–5.2)
Sodium: 138 mmol/L (ref 134–144)
Total Protein: 7.1 g/dL (ref 6.0–8.5)
eGFR: 54 mL/min/{1.73_m2} — ABNORMAL LOW (ref >59)

## 2023-01-07 LAB — CBC WITH DIFFERENTIAL/PLATELET
Basophils Absolute: 0.1 10*3/uL (ref 0.0–0.2)
Basos: 0 %
EOS (ABSOLUTE): 0.1 10*3/uL (ref 0.0–0.4)
Eos: 0 %
Hematocrit: 43.2 % (ref 37.5–51.0)
Hemoglobin: 14.4 g/dL (ref 13.0–17.7)
Immature Grans (Abs): 0.1 10*3/uL (ref 0.0–0.1)
Immature Granulocytes: 1 %
Lymphocytes Absolute: 1.3 10*3/uL (ref 0.7–3.1)
Lymphs: 6 %
MCH: 29.2 pg (ref 26.6–33.0)
MCHC: 33.3 g/dL (ref 31.5–35.7)
MCV: 88 fL (ref 79–97)
Monocytes Absolute: 1.6 10*3/uL — ABNORMAL HIGH (ref 0.1–0.9)
Monocytes: 7 %
Neutrophils Absolute: 19.1 10*3/uL — ABNORMAL HIGH (ref 1.4–7.0)
Neutrophils: 86 %
Platelets: 319 10*3/uL (ref 150–450)
RBC: 4.93 x10E6/uL (ref 4.14–5.80)
RDW: 14.1 % (ref 11.6–15.4)
WBC: 22.3 10*3/uL (ref 3.4–10.8)

## 2023-01-08 NOTE — Assessment & Plan Note (Addendum)
He continues to have left lower quadrant tenderness.  Previous CT scan did show resolving colitis/diverticulitis however his white count remains elevated.  Adding back on course of Augmentin however we discussed that if symptoms are not improving we may need admission for IV antibiotics.  I did express to him that I do have some concerns for C. difficile as well given his recent antibiotic use as well as frequent diarrhea that he has developed.  Repeating labs today CMP and CBC and will check for C. difficile toxin.  Red flags and precautions reviewed.

## 2023-01-08 NOTE — Progress Notes (Signed)
William Wheeler - 72 y.o. male MRN [id number]  Date of birth: Jun 12, 1950  Subjective No chief complaint on file.   HPI William Wheeler is a 72 year old male here today with complaint of diarrhea.  He does continue to have some left lower quadrant pain.  He has been on multiple antibiotics over the past few weeks for treatment of diverticulitis.  Repeat CT scan 01/05/2023 did show resolving colitis/diverticulitis.  White count has remained elevated.  Recently completed course of Cipro and Flagyl.  He reports having multiple episodes of diarrhea as well as chills over the past couple of days.  Appetite is decreased.  He is drinking good amount of fluids.  He is unsure if he has had fever.  He has not noticed blood in his stool.  ROS:  A comprehensive ROS was completed and negative except as noted per HPI  No Known Allergies  Past Medical History:  Diagnosis Date   Alcohol abuse    Alcohol addiction (HCC)    Colon polyps    Diverticulitis    Diverticulitis    Hypercholesteremia    Hypertension    Skin cancer    Stroke [facility])     Past Surgical History:  Procedure Laterality Date   broken bone repair     Nose   HERNIA REPAIR     skin cancer removed  10/2021   forehead    Social History   Socioeconomic History   Marital status: Married    Spouse name: Newton Frutiger   Number of children: 2   Years of education: some college   Highest education level: Some college, no degree  Occupational History   Occupation: Scientist, research (physical sciences)  Tobacco Use   Smoking status: Former    Current packs/day: 0.00    Average packs/day: 2.5 packs/day for 29.0 years (72.5 ttl pk-yrs)    Types: Cigarettes    Start date: 89    Quit date: 2001    Years since quitting: 23.8   Smokeless tobacco: Never  Vaping Use   Vaping status: Never Used  Substance and Sexual Activity   Alcohol use: Not Currently   Drug use: Not Currently    Types: Marijuana   Sexual activity: Not Currently    Partners:  Female  Other Topics Concern   Not on file  Social History Narrative   Lives at home with his wife and two children. 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   Social Determinants of Health   Financial Resource Strain: Low Risk  (07/05/2022)   Overall Financial Resource Strain (CARDIA)    Difficulty of Paying Living Expenses: Not hard at all  Food Insecurity: No Food Insecurity (11/25/2022)   Hunger Vital Sign    Worried About Running Out of Food in the Last Year: Never true    Ran Out of Food in the Last Year: Never true  Transportation Needs: No Transportation Needs (11/25/2022)   PRAPARE - Administrator, Civil Service (Medical): No    Lack of Transportation (Non-Medical): No  Physical Activity: Inactive (07/05/2022)   Exercise Vital Sign    Days of Exercise per Week: 0 days    Minutes of Exercise per Session: 0 min  Stress: Stress Concern Present (07/05/2022)   [employer] of Occupational Health - Occupational Stress Questionnaire    Feeling of Stress : Rather much  Social Connections: Moderately Isolated (07/05/2022)   Social Connection and Isolation Panel [NHANES]    Frequency of Communication with Friends and Family: Once  a week    Frequency of Social Gatherings with Friends and Family: Never    Attends Religious Services: More than 4 times per year    Active Member of Clubs or Organizations: No    Attends Banker Meetings: Never    Marital Status: Married    Family History  Problem Relation Age of Onset   Leukemia Mother    Non-Hodgkin's lymphoma Mother    Other Father        unsure of medical history    Health Maintenance  Topic Date Due   DTaP/Tdap/Td (1 - Tdap) Never done   Zoster Vaccines- Shingrix (2 of 2) 10/12/2019   Medicare Annual Wellness (AWV)  02/10/2023   COVID-19 Vaccine (5 - 2023-24 season) 01/22/2023 (Originally 11/06/2022)   Hepatitis C Screening  02/04/2023 (Originally 02/11/1971)   Pneumonia Vaccine 64+ Years old (1 of 2 -  PCV) 02/10/2023 (Originally 02/11/1959)   INFLUENZA VACCINE  06/05/2023 (Originally 10/06/2022)   Fecal DNA (Cologuard)  06/02/2023   HPV VACCINES  Aged Out     ----------------------------------------------------------------------------------------------------------------------------------------------------------------------------------------------------------------- Physical Exam There were no vitals taken for this visit.  Physical Exam Constitutional:      Appearance: Normal appearance.  Eyes:     General: No scleral icterus. Cardiovascular:     Rate and Rhythm: Normal rate and regular rhythm.  Pulmonary:     Effort: Pulmonary effort is normal.     Breath sounds: Normal breath sounds.  Abdominal:     General: There is no distension.     Tenderness: There is abdominal tenderness (Left lower quadrant). There is no guarding.  Neurological:     Mental Status: He is alert.  Psychiatric:        Mood and Affect: Mood normal.        Behavior: Behavior normal.     ------------------------------------------------------------------------------------------------------------------------------------------------------------------------------------------------------------------- Assessment and Plan  Acute diverticulitis He continues to have left lower quadrant tenderness.  Previous CT scan did show resolving colitis/diverticulitis however his white count remains elevated.  Adding back on course of Augmentin however we discussed that if symptoms are not improving we may need admission for IV antibiotics.  I did express to him that I do have some concerns for C. difficile as well given his recent antibiotic use as well as frequent diarrhea that he has developed.  Repeating labs today CMP and CBC and will check for C. difficile toxin.  Red flags and precautions reviewed.   Meds ordered this encounter  Medications   amoxicillin-clavulanate (AUGMENTIN) 875-125 MG tablet    Sig: Take 1 tablet by  mouth 2 (two) times daily.    Dispense:  20 tablet    Refill:  0    No follow-ups on file.    This visit occurred during the SARS-CoV-2 public health emergency.  Safety protocols were in place, including screening questions prior to the visit, additional usage of staff PPE, and extensive cleaning of exam room while observing appropriate contact time as indicated for disinfecting solutions.

## 2023-01-10 LAB — C DIFFICILE, CYTOTOXIN B

## 2023-01-10 LAB — C DIFFICILE TOXINS A+B W/RFLX: C difficile Toxins A+B, EIA: NEGATIVE

## 2023-01-11 ENCOUNTER — Encounter: Payer: Self-pay | Admitting: Internal Medicine

## 2023-01-11 ENCOUNTER — Ambulatory Visit: Payer: Medicare HMO | Admitting: Internal Medicine

## 2023-01-11 VITALS — BP 108/62 | HR 50 | Ht 74.0 in | Wt 214.1 lb

## 2023-01-11 DIAGNOSIS — R634 Abnormal weight loss: Secondary | ICD-10-CM | POA: Diagnosis not present

## 2023-01-11 DIAGNOSIS — R197 Diarrhea, unspecified: Secondary | ICD-10-CM

## 2023-01-11 DIAGNOSIS — K5732 Diverticulitis of large intestine without perforation or abscess without bleeding: Secondary | ICD-10-CM | POA: Diagnosis not present

## 2023-01-11 NOTE — Patient Instructions (Addendum)
A referral was place for [facility] they will contact you to schedule a appointment   If your blood pressure at your visit was 140/90 or greater, please contact your primary care physician to follow up on this.  If you are age 70 or older, your body mass index should be between 23-30. Your Body mass index is 27.49 kg/m. If this is out of the aforementioned range listed, please consider follow up with your Primary Care Provider.  If you are age 75 or younger, your body mass index should be between 19-25. Your Body mass index is 27.49 kg/m. If this is out of the aformentioned range listed, please consider follow up with your Primary Care Provider.    The  GI providers would like to encourage you to use [facility] to communicate with providers for non-urgent requests or questions.  Due to long hold times on the telephone, sending your provider a message by [facility] may be a faster and more efficient way to get a response.  Please allow 48 business hours for a response.  Please remember that this is for non-urgent requests.     Thank you for entrusting me with your care and for choosing [facility],  Dr. Eulah Pont

## 2023-01-11 NOTE — Progress Notes (Signed)
Chief Complaint: Diverticulitis  HPI : 72 year old male with history of diverticulitis, CVA, and prior alcohol use presents with diverticulitis  He first developed diverticulitis about 3 months ago. He has been on 5 courses of antibiotics over the last 3 months. He does not have any ab pain when he is on a course of antibiotics, but once he is taken off of his antibiotics, his ab pain recurs. His bowel movements are not normal. He has liquid stools mostly. On average he is currently having 8-9 BMs per day. Denies nocturnal stools. Denies blood in the stools. Denies fevers. He will get chills only when he feels particularly sick due to his diverticulitis. He is not really eating much solid foods. He completed ciprofloxacin/Flagyl this past weekend and he is currently on Augmentin. He has diverticulitis every 2-3 years on average. Last colonoscopy was 15-20 years ago and he is not sure what it showed.  He had an episode of N&V when he was first diagnosed with diverticulitis 3 months ago. He has lost about 15-25 lbs over the last 3 months. Denies acid reflux. He has had choking episodes in the past but has been eating slower without any more issues. Denies prior EGD. Sister had Crohn's colitis. Denies alcohol use currently. He is a recovering alcoholic, and his last drink of alcohol was 5 years ago.   Wt Readings from Last 3 Encounters:  01/11/23 214 lb 2 oz (97.1 kg)  12/06/22 217 lb (98.4 kg)  11/22/22 215 lb 6.4 oz (97.7 kg)   Past Medical History:  Diagnosis Date   Alcohol abuse    Alcohol addiction (HCC)    Colon polyps    Depression    Diverticulitis    Diverticulitis    History of colon polyps    Hypercholesteremia    Hypertension    Infectious colitis    Skin cancer    Stroke [facility])    Past Surgical History:  Procedure Laterality Date   broken bone repair     Nose   COLONOSCOPY     [facility] per patient about 15 years ago   HERNIA REPAIR     skin cancer removed  10/2021    forehead   Family History  Problem Relation Age of Onset   Leukemia Mother    Non-Hodgkin's lymphoma Mother    Other Father        unsure of medical history   Breast cancer Sister    Colon cancer Neg Hx    Esophageal cancer Neg Hx    Social History   Tobacco Use   Smoking status: Former    Current packs/day: 0.00    Average packs/day: 2.5 packs/day for 29.0 years (72.5 ttl pk-yrs)    Types: Cigarettes    Start date: 73    Quit date: 2001    Years since quitting: 23.8   Smokeless tobacco: Never  Vaping Use   Vaping status: Never Used  Substance Use Topics   Alcohol use: Not Currently   Drug use: Not Currently    Types: Marijuana   Current Outpatient Medications  Medication Sig Dispense Refill   acetaminophen (TYLENOL) 325 MG tablet Take 650 mg by mouth every 6 (six) hours as needed.     ammonium lactate (AMLACTIN) 12 % cream APPLY 1 APPLICATION TOPICALLY AS NEEDED FOR DRY SKIN. 385 g 0   amoxicillin-clavulanate (AUGMENTIN) 875-125 MG tablet Take 1 tablet by mouth 2 (two) times daily. 20 tablet 0   atorvastatin (LIPITOR)  40 MG tablet Take 1 tablet (40 mg total) by mouth daily. 90 tablet 3   cholecalciferol (VITAMIN D3) 25 MCG (1000 UNIT) tablet Take 1,000 Units by mouth daily.     clopidogrel (PLAVIX) 75 MG tablet Take 1 tablet (75 mg total) by mouth daily. 90 tablet 3   escitalopram (LEXAPRO) 10 MG tablet TAKE 1 TABLET BY MOUTH EVERY DAY 90 tablet 0   losartan-hydrochlorothiazide (HYZAAR) 50-12.5 MG tablet Take 1 tablet by mouth daily. 90 tablet 1   Omega-3 Fatty Acids (FISH OIL) 1000 MG CAPS Take 1 capsule by mouth daily at 6 (six) AM.     No current facility-administered medications for this visit.   No Known Allergies   Review of Systems: All systems reviewed and negative except where noted in HPI.   Physical Exam: BP 108/62   Pulse (!) 50   Ht 6\' 2"  (1.88 m)   Wt 214 lb 2 oz (97.1 kg)   BMI 27.49 kg/m  Constitutional: Pleasant,well-developed, male in no  acute distress. HEENT: Normocephalic and atraumatic. Conjunctivae are normal. No scleral icterus. Cardiovascular: Normal rate, regular rhythm.  Pulmonary/chest: Effort normal and breath sounds normal. No wheezing, rales or rhonchi. Abdominal: Soft, nondistended, nontender. Bowel sounds active throughout. There are no masses palpable. No hepatomegaly. Extremities: No edema Neurological: Alert and oriented to person place and time. Skin: Skin is warm and dry. No rashes noted. Psychiatric: Normal mood and affect. Behavior is normal.  Labs 12/2022: CBC with elevated WBC of 11.3 and low Hb of 12.7.  Labs 01/2023: C dif negative. CBC with elevated WBC of 22.3. CMP with elevated Cr of 1.41.  Modified barium swallow 03/17/22: IMPRESSION: Normal swallowing exam  CT A/P w/contrast 11/01/22: IMPRESSION: 1. Acute sigmoid diverticulitis. No diverticular abscess or perforation. 2.  Aortic Atherosclerosis (ICD10-I70.0).  CT A/P w/contrast 11/20/22: IMPRESSION: 1. Marked wall thickening in the sigmoid segment with associated pericolonic edema/. These changes are associated with marked rectal wall thickening. Imaging features raise the question of a left sided infectious/inflammatory colitis. Diverticulitis with secondary colonic wall thickening is also a consideration. Neoplasm considered less likely but not entirely excluded. 2. Tiny lymph nodes in the sigmoid mesocolon are likely reactive. 3. Possible 15 mm polypoid mucosal lesion right bladder wall. Urology consultation recommended. Hematuria protocol CT may prove helpful to further evaluate. 4. Enhancing soft tissue identified in the bladder base adjacent to the prostate gland, likely secondary to median lobe hypertrophy. This could also be assessed at the time of urology follow-up. 5.  Aortic Atherosclerosis (ICD10-I70.0).  CT A/P w/contrast 12/26/22: IMPRESSION: 1. Prominent long segment colonic wall thickening predominantly involving  the sigmoid colon which has slightly improved in appearance compared to the previous CT. Resolved pericolonic fat stranding and fluid at this location. Findings are favored to represent a resolving infectious or inflammatory colitis. 2. Urinary bladder wall thickening, which may be secondary to chronic outlet obstruction or cystitis. Correlate with urinalysis. 3. Probable median lobe hypertrophy of the prostate gland with mass effect upon the base of the urinary bladder. 4. Possible cholelithiasis. 5. Aortic atherosclerosis (ICD10-I70.0).  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  He has not been able to come off of antibiotic therapy without recurrence in his abdominal pain.  In addition he has had recurrent episodes of diverticulitis in the past.  I do think that he would benefit from a partial colectomy for treatment of his chronic smoldering  and recurrent diverticulitis.  Will plan to refer to surgery for consideration of surgical treatment for his diverticulitis.  Patient remains on antibiotics in interim.  Suspect that his diarrhea and weight loss are due to his persistent diverticulitis and/or antibiotics use.  If these symptoms persist after he is definitively treated for diverticulitis, then could consider further workup in the future. - Urgent referral to surgery for persistent diverticulitis - Can plan for colonoscopy after surgery for diverticulitis or if his diverticulitis were to resolve in the future.  Of note patient is on Plavix therapy so this will have to be held prior to any colonoscopy procedure.  Eulah Pont, MD  I spent 46 minutes of time, including in depth chart review, independent review of results as outlined above, communicating results with the patient directly, face-to-face time with the patient, coordinating care, ordering studies and medications  as appropriate, and documentation.

## 2023-01-12 ENCOUNTER — Ambulatory Visit: Payer: Medicare HMO | Admitting: Gastroenterology

## 2023-01-17 ENCOUNTER — Telehealth: Payer: Self-pay

## 2023-01-17 ENCOUNTER — Other Ambulatory Visit: Payer: Self-pay | Admitting: Family Medicine

## 2023-01-17 ENCOUNTER — Other Ambulatory Visit: Payer: Self-pay

## 2023-01-17 DIAGNOSIS — Z1211 Encounter for screening for malignant neoplasm of colon: Secondary | ICD-10-CM

## 2023-01-17 DIAGNOSIS — K5732 Diverticulitis of large intestine without perforation or abscess without bleeding: Secondary | ICD-10-CM | POA: Diagnosis not present

## 2023-01-17 MED ORDER — NA SULFATE-K SULFATE-MG SULF 17.5-3.13-1.6 GM/177ML PO SOLN: ORAL | 0 refills | Status: DC

## 2023-01-17 MED ORDER — AMOXICILLIN-POT CLAVULANATE 875-125 MG PO TABS: 1.0000 | ORAL_TABLET | Freq: Two times a day (BID) | ORAL | 0 refills | Status: DC

## 2023-01-17 NOTE — Telephone Encounter (Signed)
  Dear Dr Ashley Royalty  We have scheduled the above named patient for a(n) Colonoscopy procedure. Our records show that he is on anticoagulation therapy.  Please advise as to whether the patient may come off their therapy of Plavix 5 days prior to their procedure which is scheduled for 03/03/23.  Please route your response to [facility] or fax response to [phone].  Sincerely,   Dr [facility]

## 2023-01-19 ENCOUNTER — Telehealth: Payer: Self-pay

## 2023-01-19 NOTE — Telephone Encounter (Signed)
Received massage from Dr Ashley Royalty via Epic advised patient okay to hold Plavix 5 day's prior to procedure. Patient verbalized understanding

## 2023-02-23 ENCOUNTER — Other Ambulatory Visit: Payer: Self-pay | Admitting: Family Medicine

## 2023-02-23 DIAGNOSIS — R454 Irritability and anger: Secondary | ICD-10-CM

## 2023-02-27 ENCOUNTER — Telehealth: Payer: Self-pay | Admitting: Family Medicine

## 2023-02-27 NOTE — Telephone Encounter (Signed)
Did we get surgical clearance form [facility]-Dr Cristal Deer White's office?

## 2023-03-01 ENCOUNTER — Other Ambulatory Visit: Payer: Self-pay | Admitting: Family Medicine

## 2023-03-01 DIAGNOSIS — I10 Essential (primary) hypertension: Secondary | ICD-10-CM

## 2023-03-03 ENCOUNTER — Ambulatory Visit (AMBULATORY_SURGERY_CENTER): Payer: Medicare HMO | Admitting: Internal Medicine

## 2023-03-03 ENCOUNTER — Encounter: Payer: Self-pay | Admitting: Internal Medicine

## 2023-03-03 VITALS — BP 114/66 | HR 49 | Temp 98.1°F | Resp 14 | Ht 74.0 in | Wt 214.0 lb

## 2023-03-03 DIAGNOSIS — E7800 Pure hypercholesterolemia, unspecified: Secondary | ICD-10-CM | POA: Diagnosis not present

## 2023-03-03 DIAGNOSIS — D123 Benign neoplasm of transverse colon: Secondary | ICD-10-CM

## 2023-03-03 DIAGNOSIS — Z1211 Encounter for screening for malignant neoplasm of colon: Secondary | ICD-10-CM | POA: Diagnosis not present

## 2023-03-03 DIAGNOSIS — I10 Essential (primary) hypertension: Secondary | ICD-10-CM | POA: Diagnosis not present

## 2023-03-03 DIAGNOSIS — D120 Benign neoplasm of cecum: Secondary | ICD-10-CM

## 2023-03-03 DIAGNOSIS — K5730 Diverticulosis of large intestine without perforation or abscess without bleeding: Secondary | ICD-10-CM | POA: Diagnosis not present

## 2023-03-03 DIAGNOSIS — K6389 Other specified diseases of intestine: Secondary | ICD-10-CM

## 2023-03-03 DIAGNOSIS — F32A Depression, unspecified: Secondary | ICD-10-CM | POA: Diagnosis not present

## 2023-03-03 DIAGNOSIS — K5732 Diverticulitis of large intestine without perforation or abscess without bleeding: Secondary | ICD-10-CM

## 2023-03-03 DIAGNOSIS — K648 Other hemorrhoids: Secondary | ICD-10-CM

## 2023-03-03 DIAGNOSIS — K635 Polyp of colon: Secondary | ICD-10-CM

## 2023-03-03 DIAGNOSIS — D125 Benign neoplasm of sigmoid colon: Secondary | ICD-10-CM

## 2023-03-03 MED ORDER — SODIUM CHLORIDE 0.9 % IV SOLN: 500.0000 mL | Freq: Once | INTRAVENOUS | Status: DC

## 2023-03-03 NOTE — Op Note (Signed)
[facility] Patient Name: William Wheeler Procedure Date: 03/03/2023 10:15 AM MRN: [id number] Endoscopist: Particia Lather , , [id number] Age: 72 Referring MD:  Date of Birth: 02-18-1951 Gender: Male Account #: [id number] Procedure:                Colonoscopy Indications:              Screening for colorectal malignant neoplasm,                            Incidental - Follow-up of diverticulitis Medicines:                Monitored Anesthesia Care Procedure:                Pre-Anesthesia Assessment:                           - Prior to the procedure, a History and Physical                            was performed, and patient medications and                            allergies were reviewed. The patient's tolerance of                            previous anesthesia was also reviewed. The risks                            and benefits of the procedure and the sedation                            options and risks were discussed with the patient.                            All questions were answered, and informed consent                            was obtained. Prior Anticoagulants: The patient has                            taken Plavix (clopidogrel), last dose was 4 days                            prior to procedure. ASA Grade Assessment: II - A                            patient with mild systemic disease. After reviewing                            the risks and benefits, the patient was deemed in                            satisfactory condition to undergo the procedure.  After obtaining informed consent, the colonoscope                            was passed under direct vision. Throughout the                            procedure, the patient's blood pressure, pulse, and                            oxygen saturations were monitored continuously. The                            Olympus Scope [id number] was introduced through the                             anus and advanced to the the cecum, identified by                            appendiceal orifice and ileocecal valve. The                            colonoscopy was performed without difficulty. The                            patient tolerated the procedure well. The quality                            of the bowel preparation was good. The ileocecal                            valve, appendiceal orifice, and rectum were                            photographed. Scope In: 10:19:05 AM Scope Out: 10:45:50 AM Scope Withdrawal Time: 0 hours 19 minutes 47 seconds  Total Procedure Duration: 0 hours 26 minutes 45 seconds  Findings:                 A 15 mm polyp was found in the cecum. The polyp was                            sessile. The polyp was removed with a cold snare.                            Resection and retrieval were complete.                           Two sessile polyps were found in the sigmoid colon                            and transverse colon. The polyps were 3 to 6 mm in                            size.  These polyps were removed with a cold snare.                            Resection and retrieval were complete.                           Multiple diverticula were found in the sigmoid                            colon, descending colon and transverse colon.                           Non-bleeding internal hemorrhoids were found during                            retroflexion. Complications:            No immediate complications. Estimated Blood Loss:     Estimated blood loss was minimal. Impression:               - One 15 mm polyp in the cecum, removed with a cold                            snare. Resected and retrieved.                           - Two 3 to 6 mm polyps in the sigmoid colon and in                            the transverse colon, removed with a cold snare.                            Resected and retrieved.                           - Diverticulosis in the  sigmoid colon, in the                            descending colon and in the transverse colon.                           - Non-bleeding internal hemorrhoids. Recommendation:           - Discharge patient to home (with escort).                           - Await pathology results.                           - Okay to restart Plavix tomorrow.                           - Will notify Dr. Cliffton Asters of these results so that he                            can decide  on timing of surgery.                           - The findings and recommendations were discussed                            with the patient. Dr Particia Lather "[place]" Zoar,  03/03/2023 10:51:45 AM

## 2023-03-03 NOTE — Progress Notes (Signed)
Pt's states no medical or surgical changes since previsit or office visit. 

## 2023-03-03 NOTE — Progress Notes (Signed)
Called to room to assist during endoscopic procedure.  Patient ID and intended procedure confirmed with present staff. Received instructions for my participation in the procedure from the performing physician.  

## 2023-03-03 NOTE — Progress Notes (Signed)
Report to PACU, RN, vss, BBS= Clear.  

## 2023-03-03 NOTE — Progress Notes (Signed)
GASTROENTEROLOGY PROCEDURE H&P NOTE   Primary Care Physician: Everrett Coombe, DO    Reason for Procedure:   Diverticulitis, colon cancer screening  Plan:    Colonoscopy  Patient is appropriate for endoscopic procedure(s) in the ambulatory ([facility]) setting.  The nature of the procedure, as well as the risks, benefits, and alternatives were carefully and thoroughly reviewed with the patient. Ample time for discussion and questions allowed. The patient understood, was satisfied, and agreed to proceed.     HPI: William Wheeler is a 72 y.o. male who presents for colonoscopy for evaluation of diverticulitis .  Patient was most recently seen in the Gastroenterology Clinic on 01/11/23.  He saw colorectal surgery (Dr. Cliffton Asters) who recommended a colonoscopy prior to surgery. Please refer to that note for full details regarding GI history and clinical presentation.   Past Medical History:  Diagnosis Date   Alcohol abuse    Alcohol addiction (HCC)    Colon polyps    Depression    Diverticulitis    Diverticulitis    History of colon polyps    Hypercholesteremia    Hypertension    Infectious colitis    Skin cancer    Stroke [facility])     Past Surgical History:  Procedure Laterality Date   broken bone repair     Nose   COLONOSCOPY     [facility] per patient about 15 years ago   HERNIA REPAIR     skin cancer removed  10/2021   forehead    Prior to Admission medications   Medication Sig Start Date End Date Taking? Authorizing Provider  acetaminophen (TYLENOL) 325 MG tablet Take 650 mg by mouth every 6 (six) hours as needed.    [provider]  ammonium lactate (AMLACTIN) 12 % cream APPLY 1 APPLICATION TOPICALLY AS NEEDED FOR DRY SKIN. 12/16/22   McCaughan, Dia D, DPM  amoxicillin-clavulanate (AUGMENTIN) 875-125 MG tablet Take 1 tablet by mouth 2 (two) times daily. 01/17/23   Everrett Coombe, DO  atorvastatin (LIPITOR) 40 MG tablet Take 1 tablet (40 mg total) by mouth daily. 10/03/22    Everrett Coombe, DO  cholecalciferol (VITAMIN D3) 25 MCG (1000 UNIT) tablet Take 1,000 Units by mouth daily.    [provider]  clopidogrel (PLAVIX) 75 MG tablet Take 1 tablet (75 mg total) by mouth daily. 06/23/22   Everrett Coombe, DO  escitalopram (LEXAPRO) 10 MG tablet TAKE 1 TABLET BY MOUTH EVERY DAY 02/23/23   Everrett Coombe, DO  losartan-hydrochlorothiazide [facility]) 50-12.5 MG tablet TAKE 1 TABLET EVERY DAY 03/02/23   Everrett Coombe, DO  Na Sulfate-K Sulfate-Mg Sulf 17.5-3.13-1.6 GM/177ML SOLN Use as directed; may use generic; goodrx card if insurance will not cover generic 01/17/23   Imogene Burn, MD  Omega-3 Fatty Acids (FISH OIL) 1000 MG CAPS Take 1 capsule by mouth daily at 6 (six) AM.    [provider]    Current Outpatient Medications  Medication Sig Dispense Refill   acetaminophen (TYLENOL) 325 MG tablet Take 650 mg by mouth every 6 (six) hours as needed.     ammonium lactate (AMLACTIN) 12 % cream APPLY 1 APPLICATION TOPICALLY AS NEEDED FOR DRY SKIN. 385 g 0   amoxicillin-clavulanate (AUGMENTIN) 875-125 MG tablet Take 1 tablet by mouth 2 (two) times daily. 20 tablet 0   atorvastatin (LIPITOR) 40 MG tablet Take 1 tablet (40 mg total) by mouth daily. 90 tablet 3   cholecalciferol (VITAMIN D3) 25 MCG (1000 UNIT) tablet Take 1,000 Units by  mouth daily.     clopidogrel (PLAVIX) 75 MG tablet Take 1 tablet (75 mg total) by mouth daily. 90 tablet 3   escitalopram (LEXAPRO) 10 MG tablet TAKE 1 TABLET BY MOUTH EVERY DAY 90 tablet 0   losartan-hydrochlorothiazide (HYZAAR) 50-12.5 MG tablet TAKE 1 TABLET EVERY DAY 90 tablet 3   Na Sulfate-K Sulfate-Mg Sulf 17.5-3.13-1.6 GM/177ML SOLN Use as directed; may use generic; goodrx card if insurance will not cover generic 354 mL 0   Omega-3 Fatty Acids (FISH OIL) 1000 MG CAPS Take 1 capsule by mouth daily at 6 (six) AM.     No current facility-administered medications for this visit.    Allergies as of 03/03/2023   (No Known  Allergies)    Family History  Problem Relation Age of Onset   Leukemia Mother    Non-Hodgkin's lymphoma Mother    Other Father        unsure of medical history   Breast cancer Sister    Colon cancer Neg Hx    Esophageal cancer Neg Hx     Social History   Socioeconomic History   Marital status: Married    Spouse name: Garred Roques   Number of children: 2   Years of education: some college   Highest education level: Some college, no degree  Occupational History   Occupation: Scientist, research (physical sciences)  Tobacco Use   Smoking status: Former    Current packs/day: 0.00    Average packs/day: 2.5 packs/day for 29.0 years (72.5 ttl pk-yrs)    Types: Cigarettes    Start date: 75    Quit date: 2001    Years since quitting: 24.0   Smokeless tobacco: Never  Vaping Use   Vaping status: Never Used  Substance and Sexual Activity   Alcohol use: Not Currently   Drug use: Not Currently    Types: Marijuana   Sexual activity: Not Currently    Partners: Female  Other Topics Concern   Not on file  Social History Narrative   Lives at home with his wife and two children. 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   Social Drivers of Corporate investment banker Strain: Low Risk  (07/05/2022)   Overall Financial Resource Strain (CARDIA)    Difficulty of Paying Living Expenses: Not hard at all  Food Insecurity: No Food Insecurity (11/25/2022)   Hunger Vital Sign    Worried About Running Out of Food in the Last Year: Never true    Ran Out of Food in the Last Year: Never true  Transportation Needs: No Transportation Needs (11/25/2022)   PRAPARE - Administrator, Civil Service (Medical): No    Lack of Transportation (Non-Medical): No  Physical Activity: Unknown (07/05/2022)   Exercise Vital Sign    Days of Exercise per Week: 0 days    Minutes of Exercise per Session: Not on file  Recent Concern: Physical Activity - Inactive (07/05/2022)   Exercise Vital Sign    Days of Exercise per Week:  0 days    Minutes of Exercise per Session: 0 min  Stress: Stress Concern Present (07/05/2022)   [employer] of Occupational Health - Occupational Stress Questionnaire    Feeling of Stress : Rather much  Social Connections: Moderately Isolated (07/05/2022)   Social Connection and Isolation Panel [NHANES]    Frequency of Communication with Friends and Family: Once a week    Frequency of Social Gatherings with Friends and Family: Never    Attends Religious Services: More  than 4 times per year    Active Member of Clubs or Organizations: No    Attends Banker Meetings: Not on file    Marital Status: Married  Intimate Partner Violence: Not At Risk (11/20/2022)   Humiliation, Afraid, Rape, and Kick questionnaire    Fear of Current or Ex-Partner: No    Emotionally Abused: No    Physically Abused: No    Sexually Abused: No    Physical Exam: Vital signs in last 24 hours: BP 125/67   Pulse (!) 52   Temp 98.1 F (36.7 C) (Temporal)   Ht 6\' 2"  (1.88 m)   Wt 214 lb (97.1 kg)   SpO2 97%   BMI 27.48 kg/m  GEN: NAD EYE: Sclerae anicteric ENT: MMM CV: Non-tachycardic Pulm: No increased WOB GI: Soft NEURO:  Alert & Oriented   Eulah Pont, MD [facility]   03/03/2023 9:34 AM

## 2023-03-03 NOTE — Patient Instructions (Signed)
-   Await pathology results. - Okay to restart Plavix tomorrow. - Will notify Dr. Cliffton Asters of these results so that he can decide on timing of surgery.   YOU HAD AN ENDOSCOPIC PROCEDURE TODAY AT THE [facility]:   Refer to the procedure report that was given to you for any specific questions about what was found during the examination.  If the procedure report does not answer your questions, please call your gastroenterologist to clarify.  If you requested that your care partner not be given the details of your procedure findings, then the procedure report has been included in a sealed envelope for you to review at your convenience later.  YOU SHOULD EXPECT: Some feelings of bloating in the abdomen. Passage of more gas than usual.  Walking can help get rid of the air that was put into your GI tract during the procedure and reduce the bloating. If you had a lower endoscopy (such as a colonoscopy or flexible sigmoidoscopy) you may notice spotting of blood in your stool or on the toilet paper. If you underwent a bowel prep for your procedure, you may not have a normal bowel movement for a few days.  Please Note:  You might notice some irritation and congestion in your nose or some drainage.  This is from the oxygen used during your procedure.  There is no need for concern and it should clear up in a day or so.  SYMPTOMS TO REPORT IMMEDIATELY:  Following lower endoscopy (colonoscopy or flexible sigmoidoscopy):  Excessive amounts of blood in the stool  Significant tenderness or worsening of abdominal pains  Swelling of the abdomen that is new, acute  Fever of 100F or higher  For urgent or emergent issues, a gastroenterologist can be reached at any hour by calling [phone]. Do not use MyChart messaging for urgent concerns.    DIET:  We do recommend a small meal at first, but then you may proceed to your regular diet.  Drink plenty of fluids but you should avoid alcoholic beverages for  24 hours.  ACTIVITY:  You should plan to take it easy for the rest of today and you should NOT DRIVE or use heavy machinery until tomorrow (because of the sedation medicines used during the test).    FOLLOW UP: Our staff will call the number listed on your records the next business day following your procedure.  We will call around 7:15- 8:00 am to check on you and address any questions or concerns that you may have regarding the information given to you following your procedure. If we do not reach you, we will leave a message.     If any biopsies were taken you will be contacted by phone or by letter within the next 1-3 weeks.  Please call us at [phone] if you have not heard about the biopsies in 3 weeks.    SIGNATURES/CONFIDENTIALITY: You and/or your care partner have signed paperwork which will be entered into your electronic medical record.  These signatures attest to the fact that that the information above on your After Visit Summary has been reviewed and is understood.  Full responsibility of the confidentiality of this discharge information lies with you and/or your care-partner.

## 2023-03-06 ENCOUNTER — Telehealth: Payer: Self-pay

## 2023-03-06 ENCOUNTER — Encounter: Payer: Self-pay | Admitting: Family Medicine

## 2023-03-06 NOTE — Telephone Encounter (Signed)
  Follow up Call-     03/03/2023    9:30 AM  Call back number  Post procedure Call Back phone  # [phone]  Permission to leave phone message Yes     Patient questions:  Do you have a fever, pain , or abdominal swelling? No. Pain Score  0 *  Have you tolerated food without any problems? Yes.    Have you been able to return to your normal activities? Yes.    Do you have any questions about your discharge instructions: Diet   No. Medications  No. Follow up visit  No.  Do you have questions or concerns about your Care? No.  Actions: * If pain score is 4 or above: No action needed, pain <4.

## 2023-03-09 ENCOUNTER — Encounter: Payer: Self-pay | Admitting: Internal Medicine

## 2023-03-09 LAB — SURGICAL PATHOLOGY

## 2023-03-24 ENCOUNTER — Other Ambulatory Visit: Payer: Self-pay | Admitting: Urology

## 2023-04-17 ENCOUNTER — Ambulatory Visit: Payer: Self-pay | Admitting: Surgery

## 2023-04-17 DIAGNOSIS — Z01818 Encounter for other preprocedural examination: Secondary | ICD-10-CM

## 2023-04-18 NOTE — Progress Notes (Signed)
COVID Vaccine received:  []  No [x]  Yes Date of any COVID positive Test in last 90 days: no PCP - Everrett Coombe DO Cardiologist - n/a  Chest x-ray - 11/20/22 Epic EKG -  12/05/22 Epic Stress Test -  ECHO - 07/16/19 Epic Cardiac Cath -   Bowel Prep - []  No  []   Yes ______  Pacemaker / ICD device [x]  No []  Yes   Spinal Cord Stimulator:[x]  No []  Yes       History of Sleep Apnea? [x]  No []  Yes   CPAP used?- [x]  No []  Yes    Does the patient monitor blood sugar?          [x]  No []  Yes  []  N/A  Patient has: [x]  NO Hx DM   []  Pre-DM                 []  DM1  []   DM2 Does patient have a [employer] or Dexacom? []  No []  Yes   Fasting Blood Sugar Ranges-  Checks Blood Sugar _____ times a day  GLP1 agonist / usual dose - no GLP1 instructions:  SGLT-2 inhibitors / usual dose - no SGLT-2 instructions:   Blood Thinner / Instructions:Plavix- will call office to see when to stop Aspirin Instructions:no  Comments:   Activity level: Patient is able to climb a flight of stairs without difficulty; [x]  No CP  [x]  No SOB, _   Patient can  perform ADLs without assistance.   Anesthesia review: CVA, HTN, No instructions to stop Plavix  Patient denies shortness of breath, fever, cough and chest pain at PAT appointment.  Patient verbalized understanding and agreement to the Pre-Surgical Instructions that were given to them at this PAT appointment. Patient was also educated of the need to review these PAT instructions again prior to his/her surgery.I reviewed the appropriate phone numbers to call if they have any and questions or concerns.

## 2023-04-18 NOTE — Patient Instructions (Signed)
SURGICAL WAITING ROOM VISITATION  Patients having surgery or a procedure may have no more than 2 support people in the waiting area - these visitors may rotate.    Children under the age of 84 must have an adult with them who is not the patient.  Due to an increase in RSV and influenza rates and associated hospitalizations, children ages 5 and under may not visit patients in [facility] hospitals.  Visitors with respiratory illnesses are discouraged from visiting and should remain at home.  If the patient needs to stay at the hospital during part of their recovery, the visitor guidelines for inpatient rooms apply. Pre-op nurse will coordinate an appropriate time for 1 support person to accompany patient in pre-op.  This support person may not rotate.    Please refer to the [facility] website for the visitor guidelines for Inpatients (after your surgery is over and you are in a regular room).       Your procedure is scheduled on: 04/27/23   Report to [facility]    Report to admitting at 11:15 AM   Call this number if you have problems the morning of surgery [phone]   Do not eat food :After Midnight.   After Midnight you may have the following liquids until 10:30 AM DAY OF SURGERY  Water Non-Citrus Juices (without pulp, NO RED-Apple, White grape, White cranberry) Black Coffee (NO MILK/CREAM OR CREAMERS, sugar ok)  Clear Tea (NO MILK/CREAM OR CREAMERS, sugar ok) regular and decaf                             Plain Jell-O (NO RED)                                           Fruit ices (not with fruit pulp, NO RED)                                     Popsicles (NO RED)                                                               Sports drinks like Gatorade (NO RED)              Drink 2 Ensure/G2 drinks AT 10:00 PM the night before surgery.        The day of surgery:  Drink ONE (1) Pre-Surgery Clear Ensure  at 10:30 AM the morning of surgery. Drink in  one sitting. Do not sip.  This drink was given to you during your hospital  pre-op appointment visit. Nothing else to drink after completing the  Pre-Surgery Clear Ensure.   FOLLOW BOWEL PREP AND ANY ADDITIONAL PRE OP INSTRUCTIONS YOU RECEIVED FROM YOUR SURGEON'S OFFICE!!!     Oral Hygiene is also important to reduce your risk of infection.                                    Remember -  BRUSH YOUR TEETH THE MORNING OF SURGERY WITH YOUR REGULAR TOOTHPASTE  DENTURES WILL BE REMOVED PRIOR TO SURGERY PLEASE DO NOT APPLY "Poly grip" OR ADHESIVES!!!   Stop all vitamins and herbal supplements 7 days before surgery.   Take these medicines the morning of surgery with A SIP OF WATER: Atorvastatin, Escitaprolam(lexapro), tylenol if needed.             You may not have any metal on your body including hair pins, jewelry, and body piercing             Do not wear make-up, lotions, powders, perfumes/cologne, or deodorant              Men may shave face and neck.   Do not bring valuables to the hospital. [facility] IS NOT             RESPONSIBLE   FOR VALUABLES.   Contacts, glasses, dentures or bridgework may not be worn into surgery.   Bring small overnight bag day of surgery.   DO NOT BRING YOUR HOME MEDICATIONS TO THE HOSPITAL. PHARMACY WILL DISPENSE MEDICATIONS LISTED ON YOUR MEDICATION LIST TO YOU DURING YOUR ADMISSION IN THE HOSPITAL!    Patients discharged on the day of surgery will not be allowed to drive home.  Someone NEEDS to stay with you for the first 24 hours after anesthesia.   Special Instructions: Bring a copy of your healthcare power of attorney and living will documents the day of surgery if you haven't scanned them before.              Please read over the following fact sheets you were given: IF YOU HAVE QUESTIONS ABOUT YOUR PRE-OP INSTRUCTIONS PLEASE CALL [phone] Rosey Bath If you received a COVID test during your pre-op visit  it is requested that you wear a mask when  out in public, stay away from anyone that may not be feeling well and notify your surgeon if you develop symptoms. If you test positive for Covid or have been in contact with anyone that has tested positive in the last 10 days please notify you surgeon.    [facility] - Preparing for Surgery Before surgery, you can play an important role.  Because skin is not sterile, your skin needs to be as free of germs as possible.  You can reduce the number of germs on your skin by washing with CHG (chlorahexidine gluconate) soap before surgery.  CHG is an antiseptic cleaner which kills germs and bonds with the skin to continue killing germs even after washing. Please DO NOT use if you have an allergy to CHG or antibacterial soaps.  If your skin becomes reddened/irritated stop using the CHG and inform your nurse when you arrive at Short Stay. Do not shave (including legs and underarms) for at least 48 hours prior to the first CHG shower.  You may shave your face/neck.  Please follow these instructions carefully:  1.  Shower with CHG Soap the night before surgery and the  morning of surgery.  2.  If you choose to wash your hair, wash your hair first as usual with your normal  shampoo.  3.  After you shampoo, rinse your hair and body thoroughly to remove the shampoo.                             4.  Use CHG as you would any other liquid soap.  You can apply chg directly to the skin and wash.  Gently with a scrungie or clean washcloth.  5.  Apply the CHG Soap to your body ONLY FROM THE NECK DOWN.   Do   not use on face/ open                           Wound or open sores. Avoid contact with eyes, ears mouth and   genitals (private parts).                       Wash face,  Genitals (private parts) with your normal soap.             6.  Wash thoroughly, paying special attention to the area where your    surgery  will be performed.  7.  Thoroughly rinse your body with warm water from the neck down.  8.  DO NOT  shower/wash with your normal soap after using and rinsing off the CHG Soap.                9.  Pat yourself dry with a clean towel.            10.  Wear clean pajamas.            11.  Place clean sheets on your bed the night of your first shower and do not  sleep with pets. Day of Surgery : Do not apply any lotions/deodorants the morning of surgery.  Please wear clean clothes to the hospital/surgery center.  FAILURE TO FOLLOW THESE INSTRUCTIONS MAY RESULT IN THE CANCELLATION OF YOUR SURGERY  PATIENT SIGNATURE_________________________________  NURSE SIGNATURE__________________________________   Incentive Spirometer  An incentive spirometer is a tool that can help keep your lungs clear and active. This tool measures how well you are filling your lungs with each breath. Taking long deep breaths may help reverse or decrease the chance of developing breathing (pulmonary) problems (especially infection) following: A long period of time when you are unable to move or be active. BEFORE THE PROCEDURE  If the spirometer includes an indicator to show your best effort, your nurse or respiratory therapist will set it to a desired goal. If possible, sit up straight or lean slightly forward. Try not to slouch. Hold the incentive spirometer in an upright position. INSTRUCTIONS FOR USE  Sit on the edge of your bed if possible, or sit up as far as you can in bed or on a chair. Hold the incentive spirometer in an upright position. Breathe out normally. Place the mouthpiece in your mouth and seal your lips tightly around it. Breathe in slowly and as deeply as possible, raising the piston or the ball toward the top of the column. Hold your breath for 3-5 seconds or for as long as possible. Allow the piston or ball to fall to the bottom of the column. Remove the mouthpiece from your mouth and breathe out normally. Rest for a few seconds and repeat Steps 1 through 7 at least 10 times every 1-2 hours when you  are awake. Take your time and take a few normal breaths between deep breaths. The spirometer may include an indicator to show your best effort. Use the indicator as a goal to work toward during each repetition. After each set of 10 deep breaths, practice coughing to be sure your lungs are clear. If you have an incision (the cut made at the  time of surgery), support your incision when coughing by placing a pillow or rolled up towels firmly against it. Once you are able to get out of bed, walk around indoors and cough well. You may stop using the incentive spirometer when instructed by your caregiver.  RISKS AND COMPLICATIONS Take your time so you do not get dizzy or light-headed. If you are in pain, you may need to take or ask for pain medication before doing incentive spirometry. It is harder to take a deep breath if you are having pain. AFTER USE Rest and breathe slowly and easily. It can be helpful to keep track of a log of your progress. Your caregiver can provide you with a simple table to help with this. If you are using the spirometer at home, follow these instructions: SEEK MEDICAL CARE IF:  You are having difficultly using the spirometer. You have trouble using the spirometer as often as instructed. Your pain medication is not giving enough relief while using the spirometer. You develop fever of 100.5 F (38.1 C) or higher. SEEK IMMEDIATE MEDICAL CARE IF:  You cough up bloody sputum that had not been present before. You develop fever of 102 F (38.9 C) or greater. You develop worsening pain at or near the incision site. MAKE SURE YOU:  Understand these instructions. Will watch your condition. Will get help right away if you are not doing well or get worse. Document Released: 07/04/2006 Document Revised: 05/16/2011 Document Reviewed: 09/04/2006   WHAT IS A BLOOD TRANSFUSION? Blood Transfusion Information  A transfusion is the replacement of blood or some of its parts. Blood is  made up of multiple cells which provide different functions. Red blood cells carry oxygen and are used for blood loss replacement. White blood cells fight against infection. Platelets control bleeding. Plasma helps clot blood. Other blood products are available for specialized needs, such as hemophilia or other clotting disorders. BEFORE THE TRANSFUSION  Who gives blood for transfusions?  Healthy volunteers who are fully evaluated to make sure their blood is safe. This is blood bank blood. Transfusion therapy is the safest it has ever been in the practice of medicine. Before blood is taken from a donor, a complete history is taken to make sure that person has no history of diseases nor engages in risky social behavior (examples are intravenous drug use or sexual activity with multiple partners). The donor's travel history is screened to minimize risk of transmitting infections, such as malaria. The donated blood is tested for signs of infectious diseases, such as HIV and hepatitis. The blood is then tested to be sure it is compatible with you in order to minimize the chance of a transfusion reaction. If you or a relative donates blood, this is often done in anticipation of surgery and is not appropriate for emergency situations. It takes many days to process the donated blood. RISKS AND COMPLICATIONS Although transfusion therapy is very safe and saves many lives, the main dangers of transfusion include:  Getting an infectious disease. Developing a transfusion reaction. This is an allergic reaction to something in the blood you were given. Every precaution is taken to prevent this. The decision to have a blood transfusion has been considered carefully by your caregiver before blood is given. Blood is not given unless the benefits outweigh the risks. AFTER THE TRANSFUSION Right after receiving a blood transfusion, you will usually feel much better and more energetic. This is especially true if your red  blood cells have gotten low (  anemic). The transfusion raises the level of the red blood cells which carry oxygen, and this usually causes an energy increase. The nurse administering the transfusion will monitor you carefully for complications. HOME CARE INSTRUCTIONS  No special instructions are needed after a transfusion. You may find your energy is better. Speak with your caregiver about any limitations on activity for underlying diseases you may have. SEEK MEDICAL CARE IF:  Your condition is not improving after your transfusion. You develop redness or irritation at the intravenous (IV) site. SEEK IMMEDIATE MEDICAL CARE IF:  Any of the following symptoms occur over the next 12 hours: Shaking chills. You have a temperature by mouth above 102 F (38.9 C), not controlled by medicine. Chest, back, or muscle pain. People around you feel you are not acting correctly or are confused. Shortness of breath or difficulty breathing. Dizziness and fainting. You get a rash or develop hives. You have a decrease in urine output. Your urine turns a dark color or changes to pink, red, or brown. Any of the following symptoms occur over the next 10 days: You have a temperature by mouth above 102 F (38.9 C), not controlled by medicine. Shortness of breath. Weakness after normal activity. The white part of the eye turns yellow (jaundice). You have a decrease in the amount of urine or are urinating less often. Your urine turns a dark color or changes to pink, red, or brown. Document Released: 02/19/2000 Document Revised: 05/16/2011 Document Reviewed: 10/08/2007 [facility] Patient Information 2014 [place], [place].

## 2023-04-20 ENCOUNTER — Other Ambulatory Visit: Payer: Self-pay

## 2023-04-20 ENCOUNTER — Encounter (HOSPITAL_COMMUNITY)
Admission: RE | Admit: 2023-04-20 | Discharge: 2023-04-20 | Disposition: A | Discharge status: Home / Self Care | Payer: Medicare HMO | Source: Ambulatory Visit | Attending: Surgery | Admitting: Surgery

## 2023-04-20 ENCOUNTER — Encounter (HOSPITAL_COMMUNITY): Payer: Self-pay

## 2023-04-20 DIAGNOSIS — Z01812 Encounter for preprocedural laboratory examination: Secondary | ICD-10-CM | POA: Diagnosis not present

## 2023-04-20 DIAGNOSIS — Z01818 Encounter for other preprocedural examination: Secondary | ICD-10-CM

## 2023-04-20 HISTORY — DX: Unspecified osteoarthritis, unspecified site: M19.90

## 2023-04-20 HISTORY — DX: Personal history of other diseases of the digestive system: Z87.19

## 2023-04-20 LAB — COMPREHENSIVE METABOLIC PANEL
ALT: 22 U/L (ref 0–44)
AST: 21 U/L (ref 15–41)
Albumin: 4.1 g/dL (ref 3.5–5.0)
Alkaline Phosphatase: 36 U/L — ABNORMAL LOW (ref 38–126)
Anion gap: 9 (ref 5–15)
BUN: 27 mg/dL — ABNORMAL HIGH (ref 8–23)
CO2: 24 mmol/L (ref 22–32)
Calcium: 9.9 mg/dL (ref 8.9–10.3)
Chloride: 106 mmol/L (ref 98–111)
Creatinine, Ser: 1.2 mg/dL (ref 0.61–1.24)
GFR, Estimated: >60 mL/min (ref >60)
Glucose, Bld: 99 mg/dL (ref 70–99)
Potassium: 4.6 mmol/L (ref 3.5–5.1)
Sodium: 139 mmol/L (ref 135–145)
Total Bilirubin: 0.6 mg/dL (ref 0.0–1.2)
Total Protein: 7.5 g/dL (ref 6.5–8.1)

## 2023-04-20 LAB — CBC WITH DIFFERENTIAL/PLATELET
Abs Immature Granulocytes: 0.03 10*3/uL (ref 0.00–0.07)
Basophils Absolute: 0.1 10*3/uL (ref 0.0–0.1)
Basophils Relative: 1 %
Eosinophils Absolute: 0.1 10*3/uL (ref 0.0–0.5)
Eosinophils Relative: 2 %
HCT: 44 % (ref 39.0–52.0)
Hemoglobin: 14.2 g/dL (ref 13.0–17.0)
Immature Granulocytes: 0 %
Lymphocytes Relative: 24 %
Lymphs Abs: 1.8 10*3/uL (ref 0.7–4.0)
MCH: 28.5 pg (ref 26.0–34.0)
MCHC: 32.3 g/dL (ref 30.0–36.0)
MCV: 88.2 fL (ref 80.0–100.0)
Monocytes Absolute: 0.7 10*3/uL (ref 0.1–1.0)
Monocytes Relative: 9 %
Neutro Abs: 4.7 10*3/uL (ref 1.7–7.7)
Neutrophils Relative %: 64 %
Platelets: 224 10*3/uL (ref 150–400)
RBC: 4.99 MIL/uL (ref 4.22–5.81)
RDW: 13.8 % (ref 11.5–15.5)
WBC: 7.5 10*3/uL (ref 4.0–10.5)
nRBC: 0 % (ref 0.0–0.2)

## 2023-04-23 NOTE — H&P (Signed)
H&P   History of Present Illness:  73 year old male who presents today for a sigmoidectomy with Dr. Cliffton Asters.  Patient is planned to receive firefly injection into the ureter prior to proceeding with surgery.  Past Medical History:  Diagnosis Date   Alcohol abuse    Alcohol addiction (HCC)    Arthritis    Colon polyps    Depression    Diverticulitis    Diverticulitis    History of colon polyps    History of hiatal hernia    Hypercholesteremia    Hypertension    Infectious colitis    Skin cancer    Stroke [facility])    Past Surgical History:  Procedure Laterality Date   broken bone repair     Nose   COLONOSCOPY     [facility] per patient about 15 years ago   HERNIA REPAIR     skin cancer removed  10/2021   forehead   TONSILLECTOMY      Home Medications:  No medications prior to admission.   Allergies: No Known Allergies  Family History  Problem Relation Age of Onset   Leukemia Mother    Non-Hodgkin's lymphoma Mother    Other Father        unsure of medical history   Breast cancer Sister    Colon cancer Neg Hx    Esophageal cancer Neg Hx    Social History:  reports that he quit smoking about 24 years ago. His smoking use included cigarettes. He started smoking about 53 years ago. He has a 72.5 pack-year smoking history. He has never used smokeless tobacco. He reports that he does not currently use alcohol. He reports that he does not currently use drugs after having used the following drugs: Marijuana.  ROS: A complete review of systems was performed.  All systems are negative except for pertinent findings as noted. ROS   Physical Exam:  Vital signs in last 24 hours:   General:  Alert and oriented, No acute distress  Cardiovascular: Regular rate and rhythm Lungs: Regular rate and effort  Laboratory Data:  No results found for this or any previous visit (from the past 24 hours). No results found for this or any previous visit (from the past 240  hours). Creatinine: Recent Labs    04/20/23 0857  CREATININE 1.20    Impression/Assessment:  41 M w/ diverticulits planned for sigmoidectomy general surgery has request firefly prior to surgery.   WE discussed risks benefits and alternatives to the procedure including bleeding, infections, and damage to the urethra and ureter. Patient voiced his understanding and would like to proceed the surgery.   Plan:  To OR for cysto firefly instillation   Adonis Brook 04/23/2023, 11:15 AM

## 2023-04-24 NOTE — Progress Notes (Signed)
COVID Vaccine received:  []  No [x]  Yes Date of any COVID positive Test in last 90 days: no PCP - Everrett Coombe DO Cardiologist - n/a  Chest x-ray - 11/20/22 Epic EKG -  12/05/22 Epic Stress Test -  ECHO - 07/16/19 Epic Cardiac Cath -   Bowel Prep - []  No  []   Yes ______  Pacemaker / ICD device [x]  No []  Yes   Spinal Cord Stimulator:[x]  No []  Yes       History of Sleep Apnea? [x]  No []  Yes   CPAP used?- [x]  No []  Yes    Does the patient monitor blood sugar?          [x]  No []  Yes  []  N/A  Patient has: [x]  NO Hx DM   []  Pre-DM                 []  DM1  []   DM2 Does patient have a [employer] or [employer]? []  No []  Yes   Fasting Blood Sugar Ranges-  Checks Blood Sugar _____ times a day  GLP1 agonist / usual dose - no GLP1 instructions:  SGLT-2 inhibitors / usual dose - no SGLT-2 instructions:   Blood Thinner / Instructions:Plavix- will call office to see when to stop Aspirin Instructions:no  Comments:   Activity level: Patient is able to climb a flight of stairs without difficulty; [x]  No CP  [x]  No SOB, _   Patient can  perform ADLs without assistance.   Anesthesia review: CVA, HTN, No instructions to stop Plavix- Pt. Did call [facility] office and received instructions on stopping Plavix. Last dose was 04/22/23 am  Patient denies shortness of breath, fever, cough and chest pain at PAT appointment.  Patient verbalized understanding and agreement to the Pre-Surgical Instructions that were given to them at this PAT appointment. Patient was also educated of the need to review these PAT instructions again prior to his/her surgery.I reviewed the appropriate phone numbers to call if they have any and questions or concerns.

## 2023-04-25 ENCOUNTER — Other Ambulatory Visit: Payer: Self-pay | Admitting: Family Medicine

## 2023-04-25 ENCOUNTER — Other Ambulatory Visit: Payer: Self-pay

## 2023-04-25 DIAGNOSIS — R454 Irritability and anger: Secondary | ICD-10-CM

## 2023-04-25 MED ORDER — ESCITALOPRAM OXALATE 10 MG PO TABS: 10.0000 mg | ORAL_TABLET | Freq: Every day | ORAL | 0 refills | Status: DC

## 2023-04-25 NOTE — Telephone Encounter (Signed)
Pls contact the pt to schedule 6 month follow-up with Dr. Ashley Royalty. Sending 30 day med refill. Thanks

## 2023-04-27 ENCOUNTER — Other Ambulatory Visit: Payer: Self-pay

## 2023-04-27 ENCOUNTER — Encounter (HOSPITAL_COMMUNITY): Payer: Self-pay | Admitting: Surgery

## 2023-04-27 ENCOUNTER — Inpatient Hospital Stay (HOSPITAL_COMMUNITY): Payer: Medicare HMO | Admitting: Anesthesiology

## 2023-04-27 ENCOUNTER — Inpatient Hospital Stay (HOSPITAL_COMMUNITY): Payer: Medicare HMO

## 2023-04-27 ENCOUNTER — Inpatient Hospital Stay (HOSPITAL_COMMUNITY)
Admission: RE | Admit: 2023-04-27 | Discharge: 2023-04-30 | DRG: 331 | Disposition: A | Discharge status: Home / Self Care | Payer: Medicare HMO | Source: Ambulatory Visit | Attending: Surgery | Admitting: Surgery

## 2023-04-27 ENCOUNTER — Encounter (HOSPITAL_COMMUNITY)
Admission: RE | Disposition: A | Discharge status: Home / Self Care | Payer: Self-pay | Source: Ambulatory Visit | Attending: Surgery

## 2023-04-27 DIAGNOSIS — K5732 Diverticulitis of large intestine without perforation or abscess without bleeding: Secondary | ICD-10-CM

## 2023-04-27 DIAGNOSIS — Z806 Family history of leukemia: Secondary | ICD-10-CM | POA: Diagnosis not present

## 2023-04-27 DIAGNOSIS — I10 Essential (primary) hypertension: Secondary | ICD-10-CM | POA: Diagnosis not present

## 2023-04-27 DIAGNOSIS — F32A Depression, unspecified: Secondary | ICD-10-CM | POA: Diagnosis not present

## 2023-04-27 DIAGNOSIS — Z7902 Long term (current) use of antithrombotics/antiplatelets: Secondary | ICD-10-CM

## 2023-04-27 DIAGNOSIS — Z79899 Other long term (current) drug therapy: Secondary | ICD-10-CM

## 2023-04-27 DIAGNOSIS — Z8673 Personal history of transient ischemic attack (TIA), and cerebral infarction without residual deficits: Secondary | ICD-10-CM

## 2023-04-27 DIAGNOSIS — F1021 Alcohol dependence, in remission: Secondary | ICD-10-CM | POA: Diagnosis present

## 2023-04-27 DIAGNOSIS — E7800 Pure hypercholesterolemia, unspecified: Secondary | ICD-10-CM | POA: Diagnosis present

## 2023-04-27 DIAGNOSIS — Z85828 Personal history of other malignant neoplasm of skin: Secondary | ICD-10-CM

## 2023-04-27 DIAGNOSIS — D127 Benign neoplasm of rectosigmoid junction: Secondary | ICD-10-CM | POA: Diagnosis not present

## 2023-04-27 DIAGNOSIS — Z87891 Personal history of nicotine dependence: Secondary | ICD-10-CM | POA: Diagnosis not present

## 2023-04-27 DIAGNOSIS — Z860100 Personal history of colon polyps, unspecified: Secondary | ICD-10-CM

## 2023-04-27 DIAGNOSIS — Z807 Family history of other malignant neoplasms of lymphoid, hematopoietic and related tissues: Secondary | ICD-10-CM

## 2023-04-27 DIAGNOSIS — E876 Hypokalemia: Secondary | ICD-10-CM | POA: Diagnosis not present

## 2023-04-27 DIAGNOSIS — K5730 Diverticulosis of large intestine without perforation or abscess without bleeding: Secondary | ICD-10-CM | POA: Diagnosis not present

## 2023-04-27 DIAGNOSIS — K660 Peritoneal adhesions (postprocedural) (postinfection): Secondary | ICD-10-CM | POA: Diagnosis present

## 2023-04-27 DIAGNOSIS — Z9049 Acquired absence of other specified parts of digestive tract: Principal | ICD-10-CM

## 2023-04-27 DIAGNOSIS — Z803 Family history of malignant neoplasm of breast: Secondary | ICD-10-CM

## 2023-04-27 DIAGNOSIS — R5381 Other malaise: Secondary | ICD-10-CM | POA: Diagnosis present

## 2023-04-27 DIAGNOSIS — Z408 Encounter for other prophylactic surgery: Secondary | ICD-10-CM | POA: Diagnosis not present

## 2023-04-27 HISTORY — PX: XI ROBOTIC ASSISTED LOWER ANTERIOR RESECTION: SHX6558

## 2023-04-27 HISTORY — PX: FLEXIBLE SIGMOIDOSCOPY: SHX5431

## 2023-04-27 LAB — ABO/RH: ABO/RH(D): A POS

## 2023-04-27 LAB — TYPE AND SCREEN
ABO/RH(D): A POS
Antibody Screen: NEGATIVE

## 2023-04-27 SURGERY — COLECTOMY, SIGMOID, ROBOT-ASSISTED
Anesthesia: General

## 2023-04-27 MED ORDER — 0.9 % SODIUM CHLORIDE (POUR BTL) OPTIME
TOPICAL | Status: DC | PRN
  Administered 2023-04-27: 1000 mL

## 2023-04-27 MED ORDER — BUPIVACAINE LIPOSOME 1.3 % IJ SUSP: 20.0000 mL | Freq: Once | INTRAMUSCULAR | Status: DC

## 2023-04-27 MED ORDER — IOHEXOL 300 MG/ML  SOLN
INTRAMUSCULAR | Status: DC | PRN
  Administered 2023-04-27: 10 mL

## 2023-04-27 MED ORDER — DIPHENHYDRAMINE HCL 50 MG/ML IJ SOLN: 12.5000 mg | Freq: Four times a day (QID) | INTRAMUSCULAR | Status: DC | PRN

## 2023-04-27 MED ORDER — ENSURE SURGERY PO LIQD: 237.0000 mL | Freq: Two times a day (BID) | ORAL | Status: DC

## 2023-04-27 MED ORDER — SODIUM CHLORIDE 0.9 % IV SOLN
2.0000 g | INTRAVENOUS | Status: AC
  Administered 2023-04-27: 2 g via INTRAVENOUS
  Filled 2023-04-27: qty 2

## 2023-04-27 MED ORDER — EPHEDRINE 5 MG/ML INJ
INTRAVENOUS | Status: AC
  Filled 2023-04-27: qty 5

## 2023-04-27 MED ORDER — TRAMADOL HCL 50 MG PO TABS
50.0000 mg | ORAL_TABLET | Freq: Four times a day (QID) | ORAL | Status: DC | PRN
  Administered 2023-04-27 – 2023-04-30 (×9): 50 mg via ORAL
  Filled 2023-04-27 (×9): qty 1

## 2023-04-27 MED ORDER — ORAL CARE MOUTH RINSE: 15.0000 mL | Freq: Once | OROMUCOSAL | Status: AC

## 2023-04-27 MED ORDER — HYDROCHLOROTHIAZIDE 12.5 MG PO TABS
12.5000 mg | ORAL_TABLET | Freq: Every day | ORAL | Status: DC
Start: 2023-04-28 — End: 2023-04-30
  Administered 2023-04-28 – 2023-04-30 (×3): 12.5 mg via ORAL
  Filled 2023-04-27 (×3): qty 1

## 2023-04-27 MED ORDER — FENTANYL CITRATE (PF) 100 MCG/2ML IJ SOLN
INTRAMUSCULAR | Status: DC | PRN
  Administered 2023-04-27 (×2): 100 ug via INTRAVENOUS

## 2023-04-27 MED ORDER — HEPARIN SODIUM (PORCINE) 5000 UNIT/ML IJ SOLN
5000.0000 [IU] | Freq: Three times a day (TID) | INTRAMUSCULAR | Status: DC
  Administered 2023-04-27 – 2023-04-30 (×8): 5000 [IU] via SUBCUTANEOUS
  Filled 2023-04-27 (×7): qty 1

## 2023-04-27 MED ORDER — LIDOCAINE HCL (CARDIAC) PF 100 MG/5ML IV SOSY
PREFILLED_SYRINGE | INTRAVENOUS | Status: DC | PRN
  Administered 2023-04-27: 50 mg via INTRAVENOUS

## 2023-04-27 MED ORDER — FENTANYL CITRATE (PF) 100 MCG/2ML IJ SOLN
INTRAMUSCULAR | Status: AC
  Filled 2023-04-27: qty 2

## 2023-04-27 MED ORDER — FENTANYL CITRATE PF 50 MCG/ML IJ SOSY
PREFILLED_SYRINGE | INTRAMUSCULAR | Status: AC
  Filled 2023-04-27: qty 2

## 2023-04-27 MED ORDER — KETAMINE HCL 50 MG/5ML IJ SOSY
PREFILLED_SYRINGE | INTRAMUSCULAR | Status: AC
  Filled 2023-04-27: qty 5

## 2023-04-27 MED ORDER — ACETAMINOPHEN 500 MG PO TABS: 1000.0000 mg | ORAL_TABLET | Freq: Once | ORAL | Status: DC | PRN

## 2023-04-27 MED ORDER — PROPOFOL 10 MG/ML IV BOLUS
INTRAVENOUS | Status: AC
  Filled 2023-04-27: qty 20

## 2023-04-27 MED ORDER — ALUM & MAG HYDROXIDE-SIMETH 200-200-20 MG/5ML PO SUSP: 30.0000 mL | Freq: Four times a day (QID) | ORAL | Status: DC | PRN

## 2023-04-27 MED ORDER — ROCURONIUM BROMIDE 10 MG/ML (PF) SYRINGE
PREFILLED_SYRINGE | INTRAVENOUS | Status: AC
  Filled 2023-04-27: qty 10

## 2023-04-27 MED ORDER — BUPIVACAINE LIPOSOME 1.3 % IJ SUSP
INTRAMUSCULAR | Status: AC
  Filled 2023-04-27: qty 20

## 2023-04-27 MED ORDER — DIPHENHYDRAMINE HCL 12.5 MG/5ML PO ELIX: 12.5000 mg | ORAL_SOLUTION | Freq: Four times a day (QID) | ORAL | Status: DC | PRN

## 2023-04-27 MED ORDER — ONDANSETRON HCL 4 MG PO TABS
4.0000 mg | ORAL_TABLET | Freq: Four times a day (QID) | ORAL | Status: DC | PRN
Start: 2023-04-27 — End: 2023-04-30

## 2023-04-27 MED ORDER — HEPARIN SODIUM (PORCINE) 5000 UNIT/ML IJ SOLN
5000.0000 [IU] | Freq: Once | INTRAMUSCULAR | Status: AC
Start: 2023-04-27 — End: 2023-04-27
  Administered 2023-04-27: 5000 [IU] via SUBCUTANEOUS
  Filled 2023-04-27: qty 1

## 2023-04-27 MED ORDER — IBUPROFEN 400 MG PO TABS
600.0000 mg | ORAL_TABLET | Freq: Four times a day (QID) | ORAL | Status: DC | PRN
  Administered 2023-04-28: 600 mg via ORAL
  Filled 2023-04-27: qty 1

## 2023-04-27 MED ORDER — ONDANSETRON HCL 4 MG/2ML IJ SOLN: 4.0000 mg | Freq: Four times a day (QID) | INTRAMUSCULAR | Status: DC | PRN

## 2023-04-27 MED ORDER — ALVIMOPAN 12 MG PO CAPS
12.0000 mg | ORAL_CAPSULE | Freq: Two times a day (BID) | ORAL | Status: DC
  Administered 2023-04-28: 12 mg via ORAL
  Filled 2023-04-27 (×3): qty 1

## 2023-04-27 MED ORDER — SUGAMMADEX SODIUM 200 MG/2ML IV SOLN
INTRAVENOUS | Status: DC | PRN
  Administered 2023-04-27: 200 mg via INTRAVENOUS

## 2023-04-27 MED ORDER — DEXAMETHASONE SODIUM PHOSPHATE 10 MG/ML IJ SOLN
INTRAMUSCULAR | Status: AC
  Filled 2023-04-27: qty 1

## 2023-04-27 MED ORDER — HYDROMORPHONE HCL 1 MG/ML IJ SOLN
0.5000 mg | INTRAMUSCULAR | Status: DC | PRN
  Administered 2023-04-28 (×4): 0.5 mg via INTRAVENOUS
  Filled 2023-04-27 (×5): qty 0.5

## 2023-04-27 MED ORDER — ACETAMINOPHEN 500 MG PO TABS
1000.0000 mg | ORAL_TABLET | Freq: Four times a day (QID) | ORAL | Status: DC
  Administered 2023-04-27 – 2023-04-30 (×9): 1000 mg via ORAL
  Filled 2023-04-27 (×10): qty 2

## 2023-04-27 MED ORDER — DEXAMETHASONE SODIUM PHOSPHATE 4 MG/ML IJ SOLN
INTRAMUSCULAR | Status: DC | PRN
  Administered 2023-04-27: 8 mg via INTRAVENOUS

## 2023-04-27 MED ORDER — ACETAMINOPHEN 500 MG PO TABS
1000.0000 mg | ORAL_TABLET | ORAL | Status: AC
  Administered 2023-04-27: 1000 mg via ORAL
  Filled 2023-04-27: qty 2

## 2023-04-27 MED ORDER — ALVIMOPAN 12 MG PO CAPS
12.0000 mg | ORAL_CAPSULE | ORAL | Status: AC
  Administered 2023-04-27: 12 mg via ORAL
  Filled 2023-04-27: qty 1

## 2023-04-27 MED ORDER — ATORVASTATIN CALCIUM 20 MG PO TABS
40.0000 mg | ORAL_TABLET | Freq: Every day | ORAL | Status: DC
Start: 2023-04-28 — End: 2023-04-30
  Administered 2023-04-28 – 2023-04-30 (×3): 40 mg via ORAL
  Filled 2023-04-27 (×3): qty 2

## 2023-04-27 MED ORDER — CHLORHEXIDINE GLUCONATE CLOTH 2 % EX PADS: 6.0000 | MEDICATED_PAD | Freq: Once | CUTANEOUS | Status: DC

## 2023-04-27 MED ORDER — FENTANYL CITRATE PF 50 MCG/ML IJ SOSY
25.0000 ug | PREFILLED_SYRINGE | INTRAMUSCULAR | Status: DC | PRN
  Administered 2023-04-27: 50 ug via INTRAVENOUS

## 2023-04-27 MED ORDER — SIMETHICONE 80 MG PO CHEW: 40.0000 mg | CHEWABLE_TABLET | Freq: Four times a day (QID) | ORAL | Status: DC | PRN

## 2023-04-27 MED ORDER — LOSARTAN POTASSIUM 50 MG PO TABS
50.0000 mg | ORAL_TABLET | Freq: Every day | ORAL | Status: DC
  Administered 2023-04-28 – 2023-04-30 (×3): 50 mg via ORAL
  Filled 2023-04-27 (×3): qty 1

## 2023-04-27 MED ORDER — ONDANSETRON HCL 4 MG/2ML IJ SOLN
INTRAMUSCULAR | Status: DC | PRN
Start: 2023-04-27 — End: 2023-04-27
  Administered 2023-04-27: 4 mg via INTRAVENOUS

## 2023-04-27 MED ORDER — ENSURE PRE-SURGERY PO LIQD: 592.0000 mL | Freq: Once | ORAL | Status: DC

## 2023-04-27 MED ORDER — BUPIVACAINE-EPINEPHRINE 0.25% -1:200000 IJ SOLN
INTRAMUSCULAR | Status: AC
  Filled 2023-04-27: qty 1

## 2023-04-27 MED ORDER — ONDANSETRON HCL 4 MG/2ML IJ SOLN
INTRAMUSCULAR | Status: AC
  Filled 2023-04-27: qty 2

## 2023-04-27 MED ORDER — BUPIVACAINE LIPOSOME 1.3 % IJ SUSP
INTRAMUSCULAR | Status: DC | PRN
  Administered 2023-04-27: 20 mL

## 2023-04-27 MED ORDER — HYDRALAZINE HCL 20 MG/ML IJ SOLN
INTRAMUSCULAR | Status: DC | PRN
  Administered 2023-04-27 (×3): 10 mg via INTRAVENOUS

## 2023-04-27 MED ORDER — KETAMINE HCL 10 MG/ML IJ SOLN
INTRAMUSCULAR | Status: DC | PRN
  Administered 2023-04-27: 50 mg via INTRAVENOUS

## 2023-04-27 MED ORDER — ACETAMINOPHEN 10 MG/ML IV SOLN: 1000.0000 mg | Freq: Once | INTRAVENOUS | Status: DC | PRN

## 2023-04-27 MED ORDER — HYDRALAZINE HCL 20 MG/ML IJ SOLN: 10.0000 mg | INTRAMUSCULAR | Status: DC | PRN

## 2023-04-27 MED ORDER — CHLORHEXIDINE GLUCONATE 0.12 % MT SOLN
15.0000 mL | Freq: Once | OROMUCOSAL | Status: AC
  Administered 2023-04-27: 15 mL via OROMUCOSAL

## 2023-04-27 MED ORDER — ROCURONIUM BROMIDE 100 MG/10ML IV SOLN
INTRAVENOUS | Status: DC | PRN
  Administered 2023-04-27: 80 mg via INTRAVENOUS

## 2023-04-27 MED ORDER — LIDOCAINE HCL (PF) 2 % IJ SOLN
INTRAMUSCULAR | Status: AC
  Filled 2023-04-27: qty 5

## 2023-04-27 MED ORDER — PHENYLEPHRINE 80 MCG/ML (10ML) SYRINGE FOR IV PUSH (FOR BLOOD PRESSURE SUPPORT)
PREFILLED_SYRINGE | INTRAVENOUS | Status: AC
  Filled 2023-04-27: qty 10

## 2023-04-27 MED ORDER — ACETAMINOPHEN 160 MG/5ML PO SOLN: 1000.0000 mg | Freq: Once | ORAL | Status: DC | PRN

## 2023-04-27 MED ORDER — LACTATED RINGERS IV SOLN: INTRAVENOUS | Status: DC

## 2023-04-27 MED ORDER — OXYCODONE HCL 5 MG/5ML PO SOLN: 5.0000 mg | Freq: Once | ORAL | Status: DC | PRN

## 2023-04-27 MED ORDER — BUPIVACAINE-EPINEPHRINE (PF) 0.25% -1:200000 IJ SOLN
INTRAMUSCULAR | Status: DC | PRN
  Administered 2023-04-27: 30 mL

## 2023-04-27 MED ORDER — LOSARTAN POTASSIUM-HCTZ 50-12.5 MG PO TABS: 1.0000 | ORAL_TABLET | Freq: Every day | ORAL | Status: DC

## 2023-04-27 MED ORDER — ENSURE PRE-SURGERY PO LIQD
296.0000 mL | Freq: Once | ORAL | Status: DC
Start: 2023-04-28 — End: 2023-04-27

## 2023-04-27 MED ORDER — ESCITALOPRAM OXALATE 20 MG PO TABS
10.0000 mg | ORAL_TABLET | Freq: Every day | ORAL | Status: DC
  Administered 2023-04-28 – 2023-04-30 (×3): 10 mg via ORAL
  Filled 2023-04-27 (×3): qty 1

## 2023-04-27 MED ORDER — STERILE WATER FOR INJECTION IJ SOLN
INTRAMUSCULAR | Status: AC
  Filled 2023-04-27: qty 10

## 2023-04-27 MED ORDER — LACTATED RINGERS IV SOLN
INTRAVENOUS | Status: DC
Start: 2023-04-27 — End: 2023-04-28

## 2023-04-27 MED ORDER — OXYCODONE HCL 5 MG PO TABS: 5.0000 mg | ORAL_TABLET | Freq: Once | ORAL | Status: DC | PRN

## 2023-04-27 MED ORDER — PROPOFOL 10 MG/ML IV BOLUS
INTRAVENOUS | Status: DC | PRN
  Administered 2023-04-27: 180 mg via INTRAVENOUS

## 2023-04-27 MED ORDER — SODIUM CHLORIDE (PF) 0.9 % IJ SOLN
INTRAMUSCULAR | Status: DC | PRN
  Administered 2023-04-27: 10 mL

## 2023-04-27 SURGICAL SUPPLY — 107 items
ADAPTER GOLDBERG URETERAL (ADAPTER) IMPLANT
APPLIER CLIP 5 13 M/L LIGAMAX5 (MISCELLANEOUS) IMPLANT
APPLIER CLIP ROT 10 11.4 M/L (STAPLE) IMPLANT
BAG COUNTER SPONGE SURGICOUNT (BAG) IMPLANT
BAG URO CATCHER STRL LF (MISCELLANEOUS) ×1 IMPLANT
BLADE EXTENDED COATED 6.5IN (ELECTRODE) ×1 IMPLANT
CANNULA REDUCER 12-8 DVNC XI (CANNULA) ×1 IMPLANT
CATH URETL OPEN 5X70 (CATHETERS) IMPLANT
CHLORAPREP W/TINT 26 (MISCELLANEOUS) ×1 IMPLANT
CLIP APPLIE 5 13 M/L LIGAMAX5 (MISCELLANEOUS) IMPLANT
CLIP APPLIE ROT 10 11.4 M/L (STAPLE) IMPLANT
CLIP LIGATING HEM O LOK PURPLE (MISCELLANEOUS) IMPLANT
CLIP LIGATING HEMO O LOK GREEN (MISCELLANEOUS) IMPLANT
CLOTH BEACON ORANGE TIMEOUT ST (SAFETY) ×1 IMPLANT
COVER SURGICAL LIGHT HANDLE (MISCELLANEOUS) ×2 IMPLANT
COVER TIP SHEARS 8 DVNC (MISCELLANEOUS) ×1 IMPLANT
DEFOGGER SCOPE WARMER CLEARIFY (MISCELLANEOUS) ×1 IMPLANT
DERMABOND ADVANCED .7 DNX12 (GAUZE/BANDAGES/DRESSINGS) IMPLANT
DEVICE TROCAR PUNCTURE CLOSURE (ENDOMECHANICALS) IMPLANT
DRAIN CHANNEL 19F RND (DRAIN) ×1 IMPLANT
DRAPE ARM DVNC X/XI (DISPOSABLE) ×4 IMPLANT
DRAPE COLUMN DVNC XI (DISPOSABLE) ×1 IMPLANT
DRAPE SURG IRRIG POUCH 19X23 (DRAPES) ×1 IMPLANT
DRIVER NDL LRG 8 DVNC XI (INSTRUMENTS) ×1 IMPLANT
DRIVER NDLE LRG 8 DVNC XI (INSTRUMENTS) ×1 IMPLANT
DRSG OPSITE POSTOP 4X10 (GAUZE/BANDAGES/DRESSINGS) IMPLANT
DRSG OPSITE POSTOP 4X6 (GAUZE/BANDAGES/DRESSINGS) IMPLANT
DRSG OPSITE POSTOP 4X8 (GAUZE/BANDAGES/DRESSINGS) IMPLANT
DRSG TEGADERM 2-3/8X2-3/4 SM (GAUZE/BANDAGES/DRESSINGS) ×5 IMPLANT
DRSG TEGADERM 4X4.75 (GAUZE/BANDAGES/DRESSINGS) ×1 IMPLANT
ELECT REM PT RETURN 15FT ADLT (MISCELLANEOUS) ×1 IMPLANT
ENDOLOOP SUT PDS II 0 18 (SUTURE) IMPLANT
EVACUATOR SILICONE 100CC (DRAIN) ×1 IMPLANT
GAUZE SPONGE 2X2 8PLY STRL LF (GAUZE/BANDAGES/DRESSINGS) ×1 IMPLANT
GAUZE SPONGE 4X4 12PLY STRL (GAUZE/BANDAGES/DRESSINGS) IMPLANT
GLOVE BIO SURGEON STRL SZ7.5 (GLOVE) ×3 IMPLANT
GLOVE INDICATOR 8.0 STRL GRN (GLOVE) ×3 IMPLANT
GLOVE SURG LX STRL 8.0 MICRO (GLOVE) ×1 IMPLANT
GOWN SRG XL LVL 4 BRTHBL STRL (GOWNS) ×1 IMPLANT
GOWN STRL REUS W/ TWL XL LVL3 (GOWN DISPOSABLE) ×6 IMPLANT
GRASPER SUT TROCAR 14GX15 (MISCELLANEOUS) IMPLANT
GRASPER TIP-UP FEN DVNC XI (INSTRUMENTS) ×1 IMPLANT
GUIDEWIRE ANG ZIPWIRE 038X150 (WIRE) IMPLANT
GUIDEWIRE STR DUAL SENSOR (WIRE) IMPLANT
HOLDER FOLEY CATH W/STRAP (MISCELLANEOUS) ×1 IMPLANT
IRRIG SUCT STRYKERFLOW 2 WTIP (MISCELLANEOUS) ×1 IMPLANT
IRRIGATION SUCT STRKRFLW 2 WTP (MISCELLANEOUS) ×1 IMPLANT
KIT PROCEDURE DVNC SI (MISCELLANEOUS) ×1 IMPLANT
KIT TURNOVER KIT A (KITS) IMPLANT
MANIFOLD NEPTUNE II (INSTRUMENTS) ×1 IMPLANT
NDL INSUFFLATION 14GA 120MM (NEEDLE) ×1 IMPLANT
NEEDLE INSUFFLATION 14GA 120MM (NEEDLE) ×1 IMPLANT
PACK CARDIOVASCULAR III (CUSTOM PROCEDURE TRAY) ×1 IMPLANT
PACK COLON (CUSTOM PROCEDURE TRAY) ×1 IMPLANT
PACK CYSTO (CUSTOM PROCEDURE TRAY) ×1 IMPLANT
PAD POSITIONING PINK XL (MISCELLANEOUS) ×1 IMPLANT
PENCIL SMOKE EVACUATOR (MISCELLANEOUS) IMPLANT
PROTECTOR NERVE ULNAR (MISCELLANEOUS) ×2 IMPLANT
RELOAD STAPLE 45 3.5 BLU DVNC (STAPLE) IMPLANT
RELOAD STAPLE 45 4.3 GRN DVNC (STAPLE) IMPLANT
RELOAD STAPLE 60 3.5 BLU DVNC (STAPLE) IMPLANT
RELOAD STAPLE 60 4.3 GRN DVNC (STAPLE) IMPLANT
RETRACTOR WND ALEXIS 18 MED (MISCELLANEOUS) IMPLANT
RTRCTR WOUND ALEXIS 18CM MED (MISCELLANEOUS) IMPLANT
SCISSORS LAP 5X35 DISP (ENDOMECHANICALS) IMPLANT
SCISSORS MNPLR CVD DVNC XI (INSTRUMENTS) ×1 IMPLANT
SEAL UNIV 5-12 XI (MISCELLANEOUS) ×4 IMPLANT
SEALER VESSEL EXT DVNC XI (MISCELLANEOUS) ×1 IMPLANT
SLEEVE ADV FIXATION 5X100MM (TROCAR) IMPLANT
SOL ELECTROSURG ANTI STICK (MISCELLANEOUS) ×1 IMPLANT
SOLUTION ELECTROSURG ANTI STCK (MISCELLANEOUS) ×1 IMPLANT
SPIKE FLUID TRANSFER (MISCELLANEOUS) ×1 IMPLANT
STAPLER 60 SUREFORM DVNC (STAPLE) IMPLANT
STAPLER ECHELON POWER CIR 29 (STAPLE) IMPLANT
STAPLER ECHELON POWER CIR 31 (STAPLE) IMPLANT
STAPLER RELOAD 3.5X45 BLU DVNC (STAPLE) IMPLANT
STAPLER RELOAD 3.5X60 BLU DVNC (STAPLE) IMPLANT
STAPLER RELOAD 4.3X45 GRN DVNC (STAPLE) IMPLANT
STAPLER RELOAD 4.3X60 GRN DVNC (STAPLE) ×2 IMPLANT
STOPCOCK 4 WAY LG BORE MALE ST (IV SETS) ×2 IMPLANT
SURGILUBE 2OZ TUBE FLIPTOP (MISCELLANEOUS) ×1 IMPLANT
SUT MNCRL AB 4-0 PS2 18 (SUTURE) ×1 IMPLANT
SUT PDS AB 1 CT1 27 (SUTURE) IMPLANT
SUT PDS AB 1 TP1 96 (SUTURE) IMPLANT
SUT PROLENE 0 CT 2 (SUTURE) IMPLANT
SUT PROLENE 2 0 KS (SUTURE) ×1 IMPLANT
SUT PROLENE 2 0 SH DA (SUTURE) IMPLANT
SUT SILK 2 0 SH CR/8 (SUTURE) IMPLANT
SUT SILK 2-0 18XBRD TIE 12 (SUTURE) IMPLANT
SUT SILK 3 0 SH CR/8 (SUTURE) ×1 IMPLANT
SUT SILK 3-0 18XBRD TIE 12 (SUTURE) ×1 IMPLANT
SUT V-LOC BARB 180 2/0GR6 GS22 (SUTURE) IMPLANT
SUT VIC AB 3-0 SH 18 (SUTURE) IMPLANT
SUT VIC AB 3-0 SH 27XBRD (SUTURE) IMPLANT
SUT VICRYL 0 UR6 27IN ABS (SUTURE) ×1 IMPLANT
SUTURE V-LC BRB 180 2/0GR6GS22 (SUTURE) IMPLANT
SYR 10ML LL (SYRINGE) ×1 IMPLANT
SYS LAPSCP GELPORT 120MM (MISCELLANEOUS) IMPLANT
SYS WOUND ALEXIS 18CM MED (MISCELLANEOUS) ×1 IMPLANT
SYSTEM LAPSCP GELPORT 120MM (MISCELLANEOUS) IMPLANT
SYSTEM WOUND ALEXIS 18CM MED (MISCELLANEOUS) ×1 IMPLANT
TAPE UMBILICAL 1/8 X36 TWILL (MISCELLANEOUS) ×1 IMPLANT
TRAY FOLEY MTR SLVR 16FR STAT (SET/KITS/TRAYS/PACK) ×1 IMPLANT
TROCAR ADV FIXATION 5X100MM (TROCAR) ×1 IMPLANT
TUBING CONNECTING 10 (TUBING) ×4 IMPLANT
TUBING INSUFFLATION 10FT LAP (TUBING) ×1 IMPLANT
TUBING UROLOGY SET (TUBING) IMPLANT

## 2023-04-27 NOTE — Op Note (Signed)
PATIENT: William Wheeler  73 y.o. male  Patient Care Team: Everrett Coombe, DO as PCP - General (Family Medicine)  PREOP DIAGNOSIS: DIVERTICULITIS  POSTOP DIAGNOSIS: DIVERTICULITIS  PROCEDURE:  Robotic assisted low anterior resection with double stapled colorectal anastomosis  Intraoperative assessment of perfusion using ICG fluorescence imaging Flexible sigmoidoscopy Bilateral transversus abdominus plane (TAP) blocks  SURGEON: Stephanie Coup. Yaw Escoto, MD  ASSISTANT: Romie Levee, MD  An experienced assistant was required given the complexity of this procedure and the standard of surgical care. My assistant helped with exposure through counter tension, suctioning, ligation and retraction to better visualize the surgical field. My assistant expedited sewing during the case by following my sutures. Wherever I use the term "we" in the report, my assistant actively helped me with that portion of the procedure.   ANESTHESIA: General endotracheal  EBL: 50 mL Total I/O In: 1600 [I.V.:1500; IV Piggyback:100] Out: 150 [Urine:100; Blood:50]  DRAINS: None  SPECIMEN: Rectosigmoid colon - open end proximal  COUNTS: Sponge, needle and instrument counts were reported correct x2  FINDINGS: Fibrosis of the sigmoid at the mid and distal portions consistent with prior diverticulitis.  Adhesions of the sigmoid colon to the left pelvic sidewall also consistent with prior diverticulitis. Of note, his left ureter is relatively medial compared to its normal expected anatomic location. A well perfused, tension free, hemostatic, air tight 29 mm EEA colorectal anastomosis fashioned 13 cm from the anal verge by flexible sigmoidoscopy.  NARRATIVE: Informed consent was verified. The patient was taken to the operating room, placed supine on the operating table and SCD's were applied. General endotracheal anesthesia was induced without difficulty. He was then positioned in the lithotomy position with Allen  stirrups.  Pressure points were evaluated and padded.  A foley catheter was then placed by nursing under sterile conditions. Hair on the abdomen was clipped.  He was secured to the operating table. Dr. Jennette Bill with [facility] scrubbed for his portion procedure.  Please refer to his notes for details regarding this portion of the procedure.  The abdomen was then prepped and draped in the standard sterile fashion. Surgical timeout was called indicating the correct patient, procedure, positioning and need for preoperative antibiotics.   An OG tube was placed by anesthesia and confirmed to be to suction.  At Palmer's point, a stab incision was created and the Veress needle was introduced into the peritoneal cavity on the first attempt.  Intraperitoneal location was confirmed by the aspiration and saline drop test.  Pneumoperitoneum was established to a maximum pressure of 15 mmHg using CO2.  Following this, the abdomen was marked for planned trocar sites.  Just to the right and cephalad to the umbilicus, an 8 mm incision was created and an 8 mm blunt tipped robotic trocar was cautiously placed into the peritoneal cavity.  The laparoscope was inserted and demonstrated no evidence of trocar site nor Veress needle site complications.  The Veress needle was removed.  Bilateral transversus abdominis plane blocks were then created using a dilute mixture of Exparel with Marcaine.  3 additional 8 mm robotic trochars were placed under direct visualization roughly in a line extending from the right ASIS towards the left upper quadrant. The bladder was inspected and noted to be at/below the pubic symphysis.  Staying 3 fingerbreadths above the pubic symphysis, an incision was created and the 12 mm robotic trocar inserted directed cephalad into the peritoneal cavity under direct visualization.  An additional 5 mm assist port was placed in the right lateral  abdomen under direct visualization.  The abdomen was surveyed and  there was mesh in the right inguinal canal consistent with prior inguinal hernia surgery.  No evident hernia defect at that location.  Thickening/fibrosis of the mid and distal sigmoid colon with adhesions consistent with prior history of diverticulitis.  Fibrosis of the colon wall at this location. He was positioned in Trendelenburg with the left side tilted slightly up.  Small bowel was carefully retracted out of the pelvis.  The robot was then docked and I went to the console.   The sigmoid colon was readily identified.  Prior inflammatory type adhesions were then carefully dissected free from the left abdominal wall.  Attachments of the sigmoid colon were taken down from the intersigmoid fossa.  Inflammatory type adhesions were also mobilized in the left abdominal wall.  The rectosigmoid colon was grasped and elevated anteriorly.  Beginning with a medial to lateral approach, the peritoneum overlying the presacral space was carefully incised.  The TME plane was readily gained working in a plane between the fascia propria of the rectum and the presacral fascia.  Hypogastric nerves were seen going along the the presacral fascia and were protected free of injury.  Working more proximally, the mesorectum and sigmoid mesentery were carefully mobilized off of the peritoneum.  The left ureter was identified and protected free of injury.  Of note, his left utero does have a somewhat medialized course crossing the more proximal portion of the iliac before moving into the pelvis where it has a normal configuration to the left pelvic sidewall.  This is also able to be confirmed using near-infrared fluorescence with previously placed ICG.  The left gonadal vessels were identified and protected.  These and the left ureter were both swept "down."  The superior hemorrhoidal and IMA pedicles were identified. Further mesocolon was mobilized proximally staying in this plane between the retroperitoneum proper and the mesocolon.  Attention was then turned to the lateral portion of dissection.  The sigmoid colon was then retracted to the right.  The sigmoid colon was fully mobilized. The descending colon was mobilized by incising the Dulcy Sida line of Toldt.  This was done all the way up to the level of the splenic flexure.  The associated mesocolon was also mobilized medially.  The left ureter again was confirmed to be well away from the vasculature which had been dissected medially.  The rectosigmoid colon was elevated anteriorly. The left ureter was re-identified. The IMA was clear of this. The IMA was then divided with the vessel sealer. The stump was inspected and noted to be completely hemostatic with a good seal.  The mesentery was divided out to the point of planned proximal division.  Working more distally, the rectum was identified where the tinea had splayed and there were loss of appendices epiploica.  There is some fibrosis on the proximalmost portion of the rectum and subsequent just below this location.  This also corresponded to a location overlying/just caudad to the sacral promontory.  Anatomically, this clearly represents the proximal portion of rectum.  The rectum at this location is clearly healthy without any thickening or fibrosis.  The mesentery out to this level was then cleared using the vessel sealer. The distal point of transection on the proximal rectum was identified.   A 60 mm green load robotic stapler was then placed through the 12 mm port and introduced into the peritoneal cavity.  The rectum was divided almost completely with the first firing of our  green load robotic stapler but 1 additional load was used to control the corner. The stump was then inspected and intact and healthy in appearance.  Well-formed staples are appreciated.   Attention was turned to performing a perfusion test. ICG was administered by anesthesia and at the level of the cleared mesentery proximally, there was excellent uptake of the  tracer.  The rectum was also well perfused in appearance.  There was a visible pulse in the mesentery out to the level of the cleared colon at the level of the proximal sigmoid/descending colon junction.  This colon is also supple and healthy in appearance without any thickening.  This reached into the pelvis without any difficulty and remained in that location without any tension. A locking grasper was then placed on the sigmoid staple line.   Attention was turned to the extracorporeal portion of the procedure.  The robot was undocked.  I scrubbed back in.  Incorporating the 12 mm trocar site, a Pfannenstiel incision was created and incorporated the fascial opening through the 12 mm port site.  The rectus fascia was incised and then elevated.  The rectus muscle was mobilized free of the overlying fascia.  The peritoneum was incised in the midline well above the location of the bladder.  An Alexis wound protector was placed.  Towels were placed around the field.  The divided colon was passed through the wound protector.  The point of proximal division was identified and was again on a healthy segment of supple colon with a palpable pulse in the mesentery. This was pink in color.  A pursestring device was applied.  A 2-0 Prolene on a Keith needle was passed.  The colon was divided and passed off with the open end being proximal.  EEA sizers were then introduced and a 29 mm EEA selected.  "Belt loops" consisting of 3-0 silk were placed around the pursestring suture line.  The anvil was placed and the pursestring tied.  A small amount of fat was cleared from the planned anastomosis and no diverticula were apparent within this.  This was placed back into the abdomen and a cap placed over the wound protector port site.  Pneumoperitoneum was reestablished.  I then went below to pass the stapler.  My partner remained above.  EEA sizers were cautiously introduced via the anus and advanced under direct visualization.  The  stapler was passed and the spike deployed just anterior to the staple line.  The components were then mated.  Orientation was confirmed such that there is no twisting of the colon nor small bowel underneath the mesenteric defect. Care was taken to ensure no other structures were incorporated within this either.  The stapler was then closed, held, and fired. This was then removed. The donuts were inspected and noted to be complete.  The colon proximal to the anastomosis was then gently occluded. The pelvis was filled with sterile irrigation. Under direct visualization, I passed a flexible sigmoidoscope.  The anastomosis was under water.  With good distention of the anastomosis there was no air leak. The anastomosis pink in appearance.  This is located at 13 cm from the anal verge by flexible sigmoidoscopy.  It is hemostatic.  Additionally, looking from above, there is no tension on the colon or mesentery.  Sigmoidoscope was withdrawn.  Irrigation was evacuated from the pelvis.  The abdomen and pelvis are surveyed and noted to be completely hemostatic without any apparent injury.  Under direct visualization, all trochars are removed.  The Alexis wound protector was removed.  Gowns/gloves are changed and a fresh set of clean instruments utilized. Additional sterile drapes were placed around the field.   The Pfannenstiel peritoneum was closed with a running 2-0 Vicryl suture.  The rectus fascia was then closed using 2 running #1 PDS sutures.  The fascia was then palpated and noted to be completely closed.  Additional anesthetic was infiltrated at the Pfannenstiel site.  Sponge, needle, and instrument counts were reported correct x2. 4-0 Monocryl subcuticular suture was used to close the skin of all incision sites.  Dermabond was placed over all incisions.  A honeycomb dressing placed over the Pfannenstiel as well.   He was then taken out of lithotomy, awakened from anesthesia, extubated, and transferred to a  stretcher for transport to PACU in satisfactory condition having tolerated the procedure well.

## 2023-04-27 NOTE — Anesthesia Preprocedure Evaluation (Signed)
Anesthesia Evaluation  Patient identified by MRN, date of birth, ID band Patient awake    Reviewed: Allergy & Precautions, NPO status , Patient's Chart, lab work & pertinent test results  History of Anesthesia Complications Negative for: history of anesthetic complications  Airway Mallampati: II  TM Distance: >3 FB Neck ROM: Full    Dental  (+) Teeth Intact, Dental Advisory Given   Pulmonary former smoker   breath sounds clear to auscultation       Cardiovascular hypertension, Pt. on medications (-) angina (-) Past MI and (-) CHF  Rhythm:Regular  1. Left ventricular ejection fraction, by estimation, is 55 to 60%. The  left ventricle has normal function. The left ventricle has no regional  wall motion abnormalities. Left ventricular diastolic parameters were  normal.   2. Right ventricular systolic function is normal. The right ventricular  size is normal. There is normal pulmonary artery systolic pressure.   3. The mitral valve is normal in structure. No evidence of mitral valve  regurgitation. No evidence of mitral stenosis.   4. The aortic valve is normal in structure. Aortic valve regurgitation is  mild. No aortic stenosis is present.   5. The inferior vena cava is normal in size with greater than 50%  respiratory variability, suggesting right atrial pressure of 3 mmHg.     Neuro/Psych  PSYCHIATRIC DISORDERS  Depression    TIA   GI/Hepatic Neg liver ROS, hiatal hernia,,,  Endo/Other  negative endocrine ROS    Renal/GU negative Renal ROS     Musculoskeletal  (+) Arthritis ,    Abdominal   Peds  Hematology negative hematology ROS (+) Lab Results      Component                Value               Date                      WBC                      7.5                 04/20/2023                HGB                      14.2                04/20/2023                HCT                      44.0                 04/20/2023                MCV                      88.2                04/20/2023                PLT                      224  04/20/2023              Anesthesia Other Findings   Reproductive/Obstetrics                              Anesthesia Physical Anesthesia Plan  ASA: 2  Anesthesia Plan: General   Post-op Pain Management: Tylenol PO (pre-op)*   Induction: Intravenous  PONV Risk Score and Plan: 3 and Ondansetron and Dexamethasone  Airway Management Planned: Oral ETT  Additional Equipment: None  Intra-op Plan:   Post-operative Plan: Extubation in OR  Informed Consent: I have reviewed the patients History and Physical, chart, labs and discussed the procedure including the risks, benefits and alternatives for the proposed anesthesia with the patient or authorized representative who has indicated his/her understanding and acceptance.     Dental advisory given  Plan Discussed with: CRNA  Anesthesia Plan Comments:          Anesthesia Quick Evaluation

## 2023-04-27 NOTE — H&P (Signed)
CC: Here today for surgery  HPI: William Wheeler is an 73 y.o. male with history of HTN, HLD, CVA (on plavix), whom is seen in the office today as a referral by Dr. Ashley Royalty for evaluation of smoldering diverticulitis.   CT A/P 11/01/22 IMPRESSION: 1. Acute sigmoid diverticulitis. No diverticular abscess or perforation. 2. Aortic Atherosclerosis (ICD10-I70.0).  CT A/P 11/20/22 IMPRESSION: 1. Marked wall thickening in the sigmoid segment with associated pericolonic edema/. These changes are associated with marked rectal wall thickening. Imaging features raise the question of a left sided infectious/inflammatory colitis. Diverticulitis with secondary colonic wall thickening is also a consideration. Neoplasm considered less likely but not entirely excluded. 2. Tiny lymph nodes in the sigmoid mesocolon are likely reactive. 3. Possible 15 mm polypoid mucosal lesion right bladder wall. Urology consultation recommended. Hematuria protocol CT may prove helpful to further evaluate. 4. Enhancing soft tissue identified in the bladder base adjacent to the prostate gland, likely secondary to median lobe hypertrophy. This could also be assessed at the time of urology follow-up. 5. Aortic Atherosclerosis (ICD10-I70.0).  CT A/P 01/05/23 IMPRESSION: 1. Prominent long segment colonic wall thickening predominantly involving the sigmoid colon which has slightly improved in appearance compared to the previous CT. Resolved pericolonic fat stranding and fluid at this location. Findings are favored to represent a resolving infectious or inflammatory colitis. 2. Urinary bladder wall thickening, which may be secondary to chronic outlet obstruction or cystitis. Correlate with urinalysis. 3. Probable median lobe hypertrophy of the prostate gland with mass effect upon the base of the urinary bladder. 4. Possible cholelithiasis. 5. Aortic atherosclerosis (ICD10-I70.0).  As noted above, began having  issues with left lower quadrant aminal pain which were 11/01/2022 demonstrated findings of acute uncomplicated diverticulitis. By report, this was not his first bout however. He reports a 20+ year history of left lower quadrant domino pains that would typically occur every couple of years (every 2 years on average. He reports he never had any CAT scans done to further evaluate this but was typically prescribed a course of Cipro/Flagyl or Augmentin and within a day or 2 his symptoms began to improve. His symptoms are primarily that of left lower quadrant pain, malaise, and occasionally fever. He has since had numerous attacks and has been on nearly persistent antibiotics since August. While taking oral antibiotics, his symptoms do seem to resolve and stay gone. When he comes off these, he will have recurrent left lower quadrant pain.  He recently met with a urologist at [facility] approximately 2 weeks ago and reports he had an office-based cystoscopy and was told everything was completely clean and clear. We do not have copies of this  He had his colonoscopy updated with Dr. Leonides Schanz 02/2023. He denies any changes in health or health history since we met in the office. No new medications/allergies. He states he is ready for surgery today. Watched videos on his surgery online in the interim.  PMH: HTN, HLD, CVA (on plavix)  PSH: Right inguinal hernia repair, 2022 at [facility] by presumably one of my partners  FHx: Denies any known family history of colorectal, breast, endometrial or ovarian cancer  Social Hx: Denies use of tobacco/EtOH/illicit drug. 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   Past Medical History:  Diagnosis Date   Alcohol abuse    Alcohol addiction (HCC)    Arthritis    Colon polyps    Depression  Diverticulitis    Diverticulitis    History of colon polyps    History of hiatal hernia     Hypercholesteremia    Hypertension    Infectious colitis    Skin cancer    Stroke [facility])     Past Surgical History:  Procedure Laterality Date   broken bone repair     Nose   COLONOSCOPY     [facility] per patient about 15 years ago   HERNIA REPAIR     skin cancer removed  10/2021   forehead   TONSILLECTOMY      Family History  Problem Relation Age of Onset   Leukemia Mother    Non-Hodgkin's lymphoma Mother    Other Father        unsure of medical history   Breast cancer Sister    Colon cancer Neg Hx    Esophageal cancer Neg Hx     Social:  reports that he quit smoking about 24 years ago. His smoking use included cigarettes. He started smoking about 53 years ago. He has a 72.5 pack-year smoking history. He has never used smokeless tobacco. He reports that he does not currently use alcohol. He reports that he does not currently use drugs after having used the following drugs: Marijuana.  Allergies: No Known Allergies  Medications: I have reviewed the patient's current medications.  No results found for this or any previous visit (from the past 48 hours).  No results found.   PE There were no vitals taken for this visit. Constitutional: NAD; conversant Eyes: Moist conjunctiva; no lid lag; anicteric Lungs: Normal respiratory effort CV: RRR GI: Abd soft, NT/ND Psychiatric: Appropriate affect  No results found for this or any previous visit (from the past 48 hours).  No results found.  A/P: William Wheeler is an 73 y.o. male with hx of HTN, HLD, CVA (on plavix) here for evaluation of chronic/recurrent diverticulitis which now appears to be smoldering  -Medical clearance from PCP who has been prescribing Plavix with plans to hold perioperatively - stopped 04/22/23 -Colonoscopy updated with Dr. Leonides Schanz 02/2023  -The anatomy and physiology of the GI tract was reviewed with the patient. The pathophysiology of diverticulitis was discussed as well with associated pictures. -We  have discussed various different treatment options going forward including surgery (the most definitive) to address this -robotic assisted sigmoidectomy/low anterior resection with colorectal anastomosis; flexible sigmoidoscopy; ureteral ICG ([facility], [facility]); intraoperative assessment of perfusion  -The planned procedures, material risks (including, but not limited to, pain, bleeding, infection, scarring, need for blood transfusion, damage to surrounding structures- blood vessels/nerves/viscus/organs, damage to ureter, urine leak, leak from anastomosis, need for additional procedures, scenarios where a stoma may be necessary and where it may be permanent, worsening of pre-existing medical conditions, chronic diarrhea, fecal urgency/frequency, constipation secondary to narcotic use, hernia at surgical sites, recurrence of diverticulitis of approximately 10% of cases, pneumonia, heart attack, stroke, death) benefits and alternatives to surgery were discussed at length. The patient's questions were answered to his satisfaction, he voiced understanding and elected to proceed with surgery. Additionally, we discussed typical postoperative expectations and the recovery process.  Marin Olp, MD [facility], [facility]

## 2023-04-27 NOTE — Transfer of Care (Signed)
Immediate Anesthesia Transfer of Care Note  Patient: William Wheeler  Procedure(s) Performed: ROBOTIC SIGMOIDECTOMY XI ROBOTIC ASSISTED LOWER ANTERIOR RESECTION WITH COLORECTAL ANASTOMOSIS FLEXIBLE SIGMOIDOSCOPY CYSTOSCOPY with FIREFLY INJECTION  Patient Location: PACU  Anesthesia Type:General  Level of Consciousness: awake and alert   Airway & Oxygen Therapy: Patient Spontanous Breathing and Patient connected to nasal cannula oxygen  Post-op Assessment: Report given to RN and Post -op Vital signs reviewed and stable  Post vital signs: Reviewed and stable  Last Vitals:  Vitals Value Taken Time  BP 131/72 04/27/23 1645  Temp    Pulse 76 04/27/23 1646  Resp 13 04/27/23 1646  SpO2 96 % 04/27/23 1646  Vitals shown include unfiled device data.  Last Pain:  Vitals:   04/27/23 1104  TempSrc:   PainSc: 0-No pain         Complications: No notable events documented.

## 2023-04-27 NOTE — Anesthesia Procedure Notes (Signed)
Procedure Name: Intubation Date/Time: 04/27/2023 2:16 PM  Performed by: Carloyn Manner, CRNAPre-anesthesia Checklist: Patient identified, Emergency Drugs available, Suction available, Patient being monitored and Timeout performed Patient Re-evaluated:Patient Re-evaluated prior to induction Oxygen Delivery Method: Circle system utilized Preoxygenation: Pre-oxygenation with 100% oxygen Induction Type: IV induction Ventilation: Mask ventilation without difficulty Laryngoscope Size: Miller and 2 Grade View: Grade III Tube type: Oral Tube size: 7.5 mm Number of attempts: 1 Airway Equipment and Method: Stylet

## 2023-04-27 NOTE — Op Note (Signed)
Operative Note  Preoperative diagnosis:  1.  Sigmoid diverticulitis  Postoperative diagnosis: 1.  Sigmoid diverticulitis  Procedure(s): 1.  Cystoscopy 2. Firefly instillation in bilateral ureters 3.  Bilateral retrograde pyelogram   Surgeon: Vilma Prader MD  Assistants:  None  Anesthesia:  General  Complications:  None  EBL:  None for my portion of the case  Specimens: 1. None for my portion of the case  Drains/Catheters: 1.  16 fr catheter   Intraoperative findings:   Bladder without suspicious bladder lesions.  Retrograde pyelogram interpretation: Bilateral retrograde pyelograms demonstrating that firefly reach the renal collecting system and fill the entirety of the ureter.  No filling defects noted.   Indication: 73 year old male with sigmoid diverticulitis plan for robotic assisted sigmoidectomy with low anterior resection Dr. Marin Olp as requested preoperative firefly be placed.  Description of procedure: The patient was identified and surgical site verification was performed prior to obtaining consent.  The patient was brought to the operating suite.  Under adequate general anesthesia the patient was positioned in dorsal lithotomy and prepped and draped.  A preoperative Time Out was performed addressing the anticipated surgical site, procedure, and safety precautions.  The 21Fr cystoscope was inserted into the patient's urethral meatus and advanced to the bladder.   The bladder was inspected with the 30 degree lens.  The ureteral orifices were in their normal anatomic positions.  The bladder showed no trabeculation.  There were no bladder tumors noted.  The right orifice was intubated with a sensor wire and a 5 [place] open ended catheter was placed over the wire into the right ureter and advanced to 25 cm.  The firefly was mixed with the contrast I then slowly pulled back and injected 7.60ml of the firefly contrast.  Retrograde pyelogram was taken  demonstrating that the firefly had reached the renal pelvis.  I subsequently turned my attention to the patient's left ureteral orifice and performed a similar task, injecting 7.13ml of the firefly solution. in a retrograde fashion in the left ureter.   I then  placed a 16 [place] Foley.  The case was then turned over to Dr. Lucilla Lame team for the remainder of the procedure.  Vilma Prader [facility]

## 2023-04-28 ENCOUNTER — Encounter (HOSPITAL_COMMUNITY): Payer: Self-pay | Admitting: Surgery

## 2023-04-28 LAB — BASIC METABOLIC PANEL
Anion gap: 12 (ref 5–15)
BUN: 19 mg/dL (ref 8–23)
CO2: 19 mmol/L — ABNORMAL LOW (ref 22–32)
Calcium: 8.6 mg/dL — ABNORMAL LOW (ref 8.9–10.3)
Chloride: 104 mmol/L (ref 98–111)
Creatinine, Ser: 1.02 mg/dL (ref 0.61–1.24)
GFR, Estimated: >60 mL/min (ref >60)
Glucose, Bld: 117 mg/dL — ABNORMAL HIGH (ref 70–99)
Potassium: 3.6 mmol/L (ref 3.5–5.1)
Sodium: 135 mmol/L (ref 135–145)

## 2023-04-28 LAB — CBC
HCT: 36.8 % — ABNORMAL LOW (ref 39.0–52.0)
Hemoglobin: 12 g/dL — ABNORMAL LOW (ref 13.0–17.0)
MCH: 29.3 pg (ref 26.0–34.0)
MCHC: 32.6 g/dL (ref 30.0–36.0)
MCV: 90 fL (ref 80.0–100.0)
Platelets: 204 10*3/uL (ref 150–400)
RBC: 4.09 MIL/uL — ABNORMAL LOW (ref 4.22–5.81)
RDW: 13.7 % (ref 11.5–15.5)
WBC: 13.7 10*3/uL — ABNORMAL HIGH (ref 4.0–10.5)
nRBC: 0 % (ref 0.0–0.2)

## 2023-04-28 MED ORDER — TRAMADOL HCL 50 MG PO TABS: 50.0000 mg | ORAL_TABLET | Freq: Four times a day (QID) | ORAL | 0 refills | Status: AC | PRN

## 2023-04-28 NOTE — Progress Notes (Signed)
Mobility Specialist - Progress Note   04/28/23 0850  Mobility  Activity Ambulated independently in hallway  Level of Assistance Independent  Assistive Device None  Distance Ambulated (ft) 500 ft  Activity Response Tolerated well  Mobility Referral Yes  Mobility visit 1 Mobility  Mobility Specialist Start Time (ACUTE ONLY) 0835  Mobility Specialist Stop Time (ACUTE ONLY) 0844  Mobility Specialist Time Calculation (min) (ACUTE ONLY) 9 min   Pt received in bed and agreeable to mobility. No complaints during session. Pt to EOB after session with all needs met.   [facility]

## 2023-04-28 NOTE — Progress Notes (Signed)
   04/28/23 1026  TOC Brief Assessment  Insurance and Status Reviewed  Patient has primary care physician Yes  Home environment has been reviewed resides in private residence with spouse  Prior level of function: Independent  Prior/Current Home Services No current home services  Social Drivers of Health Review SDOH reviewed no interventions necessary  Readmission risk has been reviewed Yes  Transition of care needs no transition of care needs at this time

## 2023-04-28 NOTE — Discharge Instructions (Signed)

## 2023-04-28 NOTE — Plan of Care (Signed)
   Problem: Education: Goal: Understanding of discharge needs will improve Outcome: Progressing   Problem: Activity: Goal: Ability to tolerate increased activity will improve Outcome: Progressing

## 2023-04-28 NOTE — Anesthesia Postprocedure Evaluation (Signed)
Anesthesia Post Note  Patient: Udell Mazzocco  Procedure(s) Performed: ROBOTIC SIGMOIDECTOMY XI ROBOTIC ASSISTED LOWER ANTERIOR RESECTION WITH COLORECTAL ANASTOMOSIS FLEXIBLE SIGMOIDOSCOPY CYSTOSCOPY with FIREFLY INJECTION     Patient location during evaluation: PACU Anesthesia Type: General Level of consciousness: awake and alert Pain management: pain level controlled Vital Signs Assessment: post-procedure vital signs reviewed and stable Respiratory status: spontaneous breathing, nonlabored ventilation and respiratory function stable Cardiovascular status: blood pressure returned to baseline and stable Postop Assessment: no apparent nausea or vomiting Anesthetic complications: no   No notable events documented.             Juanjesus Pepperman

## 2023-04-28 NOTE — Progress Notes (Signed)
  Subjective No acute events. Feeling quite well. Tolerating liquids without n/v. Passing flatus multiple times, no bm yet.  Objective: Vital signs in last 24 hours: Temp:  [97.5 F (36.4 C)-98.7 F (37.1 C)] 98.1 F (36.7 C) (02/21 0624) Pulse Rate:  [69-89] 84 (02/21 0624) Resp:  [13-23] 18 (02/21 0624) BP: (105-147)/(66-87) 114/69 (02/21 0624) SpO2:  [91 %-100 %] 94 % (02/21 0624) Weight:  [92.5 kg] 92.5 kg (02/20 1104) Last BM Date : 04/26/23  Intake/Output from previous day: 02/20 0701 - 02/21 0700 In: 2320 [P.O.:720; I.V.:1500; IV Piggyback:100] Out: 2300 [Urine:2250; Blood:50] Intake/Output this shift: No intake/output data recorded.  Gen: NAD, comfortable CV: RRR Pulm: Normal work of breathing Abd: Soft, appropriately tender, nondistended; incisions c/d/I without erythema Ext: SCDs in place  Lab Results: CBC  Recent Labs    04/28/23 0456  WBC 13.7*  HGB 12.0*  HCT 36.8*  PLT 204   BMET Recent Labs    04/28/23 0456  NA 135  K 3.6  CL 104  CO2 19*  GLUCOSE 117*  BUN 19  CREATININE 1.02  CALCIUM 8.6*   PT/INR No results for input(s): "LABPROT", "INR" in the last 72 hours. ABG No results for input(s): "PHART", "HCO3" in the last 72 hours.  Invalid input(s): "PCO2", "PO2"  Studies/Results:  Anti-infectives: Anti-infectives (From admission, onward)    Start     Dose/Rate Route Frequency Ordered Stop   04/27/23 1100  cefoTEtan (CEFOTAN) 2 g in sodium chloride 0.9 % 100 mL IVPB        2 g 200 mL/hr over 30 Minutes Intravenous On call to O.R. 04/27/23 1056 04/27/23 1416        Assessment/Plan: Patient Active Problem List   Diagnosis Date Noted   S/P laparoscopic-assisted sigmoidectomy 04/27/2023   Acute diverticulitis 11/20/2022   Lesion of urinary bladder 11/20/2022   Hypokalemia 11/20/2022   Irritability 07/07/2022   Frequent urination 07/07/2022   Memory change 07/07/2022   Cracked skin on feet 02/06/2022   Facial skin lesion  02/06/2022   Oropharyngeal dysphagia 02/06/2022   Muscle cramping 02/06/2022   Left ear pain 02/06/2022   Arthralgia 04/01/2021   Chronic low back pain 03/11/2021   TIA (transient ischemic attack) 06/02/2020   Inguinal hernia 12/05/2019   History of CVA (cerebrovascular accident) without residual deficits 08/13/2019   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) 07/02/2019   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   Cervical stenosis of spinal canal 11/16/2018   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   Herpes zoster without complication 11/16/2018   Essential hypertension 11/01/2018   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/01/2018   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 11/01/2018   Former very heavy cigarette smoker (more than 40 per day) 11/01/2018   History of colon polyps 11/01/2018   History of colonic diverticulitis 11/01/2018   Paresthesia of left arm 11/01/2018   Nocturia 11/01/2018   s/p Procedure(s): ROBOTIC SIGMOIDECTOMY XI ROBOTIC ASSISTED LOWER ANTERIOR RESECTION WITH COLORECTAL ANASTOMOSIS FLEXIBLE SIGMOIDOSCOPY CYSTOSCOPY with FIREFLY INJECTION 04/27/2023  - Doing great  - Diet as tolerated - D/C IVF, D/C Foley - Ambulate 5x/day as able - PPX: SQH, SCDs  We spent time reviewing his procedure, findings and plans. All questions were answered  LOS: 1 day   Marin Olp, MD [facility], [facility]

## 2023-04-29 LAB — BASIC METABOLIC PANEL
Anion gap: 6 (ref 5–15)
BUN: 25 mg/dL — ABNORMAL HIGH (ref 8–23)
CO2: 26 mmol/L (ref 22–32)
Calcium: 8.6 mg/dL — ABNORMAL LOW (ref 8.9–10.3)
Chloride: 105 mmol/L (ref 98–111)
Creatinine, Ser: 1.1 mg/dL (ref 0.61–1.24)
GFR, Estimated: >60 mL/min (ref >60)
Glucose, Bld: 110 mg/dL — ABNORMAL HIGH (ref 70–99)
Potassium: 3.4 mmol/L — ABNORMAL LOW (ref 3.5–5.1)
Sodium: 137 mmol/L (ref 135–145)

## 2023-04-29 LAB — CBC
HCT: 36.1 % — ABNORMAL LOW (ref 39.0–52.0)
Hemoglobin: 11.6 g/dL — ABNORMAL LOW (ref 13.0–17.0)
MCH: 29.1 pg (ref 26.0–34.0)
MCHC: 32.1 g/dL (ref 30.0–36.0)
MCV: 90.5 fL (ref 80.0–100.0)
Platelets: 190 10*3/uL (ref 150–400)
RBC: 3.99 MIL/uL — ABNORMAL LOW (ref 4.22–5.81)
RDW: 13.8 % (ref 11.5–15.5)
WBC: 10.2 10*3/uL (ref 4.0–10.5)
nRBC: 0 % (ref 0.0–0.2)

## 2023-04-29 MED ORDER — CLOPIDOGREL BISULFATE 75 MG PO TABS
75.0000 mg | ORAL_TABLET | Freq: Every day | ORAL | Status: DC
  Administered 2023-04-29 – 2023-04-30 (×2): 75 mg via ORAL
  Filled 2023-04-29 (×2): qty 1

## 2023-04-29 MED ORDER — OXYCODONE HCL 5 MG PO TABS
5.0000 mg | ORAL_TABLET | ORAL | Status: DC | PRN
  Administered 2023-04-29 – 2023-04-30 (×3): 5 mg via ORAL
  Filled 2023-04-29 (×4): qty 1

## 2023-04-29 MED ORDER — POTASSIUM CHLORIDE 20 MEQ PO PACK
60.0000 meq | PACK | Freq: Once | ORAL | Status: AC
  Administered 2023-04-29: 60 meq via ORAL
  Filled 2023-04-29: qty 3

## 2023-04-29 MED ORDER — HYDROMORPHONE HCL 1 MG/ML IJ SOLN: 0.5000 mg | Freq: Four times a day (QID) | INTRAMUSCULAR | Status: DC | PRN

## 2023-04-29 NOTE — Plan of Care (Signed)
  Problem: Activity: Goal: Ability to tolerate increased activity will improve Outcome: Progressing   Problem: Bowel/Gastric: Goal: Gastrointestinal status for postoperative course will improve Outcome: Progressing

## 2023-04-29 NOTE — Progress Notes (Addendum)
 Subjective No acute events. Feeling well, c/o LLQ pain. Liquid stool, flatus. Tolerating PO well. Has been requiring tramadol and dilaudid pretty frequently.   Objective: Vital signs in last 24 hours: Temp:  [98 F (36.7 C)-98.4 F (36.9 C)] 98 F (36.7 C) (02/22 0527) Pulse Rate:  [50-65] 54 (02/22 0530) Resp:  [16-17] 17 (02/22 0527) BP: (112-129)/(65-74) 129/74 (02/22 0527) SpO2:  [98 %-99 %] 98 % (02/22 0530) Weight:  [95.6 kg] 95.6 kg (02/22 0500) Last BM Date : 04/28/23  Intake/Output from previous day: 02/21 0701 - 02/22 0700 In: 2339.1 [P.O.:1560; I.V.:779.1] Out: 800 [Urine:800] Intake/Output this shift: Total I/O In: 360 [P.O.:360] Out: 400 [Urine:400]  Gen: NAD, comfortable CV: RRR Pulm: Normal work of breathing Abd: Soft, appropriately tender, nondistended; incisions c/d/I without erythema Ext: SCDs in place  Lab Results: CBC  Recent Labs    04/28/23 0456 04/29/23 0551  WBC 13.7* 10.2  HGB 12.0* 11.6*  HCT 36.8* 36.1*  PLT 204 190   BMET Recent Labs    04/28/23 0456 04/29/23 0551  NA 135 137  K 3.6 3.4*  CL 104 105  CO2 19* 26  GLUCOSE 117* 110*  BUN 19 25*  CREATININE 1.02 1.10  CALCIUM 8.6* 8.6*   PT/INR No results for input(s): "LABPROT", "INR" in the last 72 hours. ABG No results for input(s): "PHART", "HCO3" in the last 72 hours.  Invalid input(s): "PCO2", "PO2"  Studies/Results:  Anti-infectives: Anti-infectives (From admission, onward)    Start     Dose/Rate Route Frequency Ordered Stop   04/27/23 1100  cefoTEtan (CEFOTAN) 2 g in sodium chloride 0.9 % 100 mL IVPB        2 g 200 mL/hr over 30 Minutes Intravenous On call to O.R. 04/27/23 1056 04/27/23 1416        Assessment/Plan: Patient Active Problem List   Diagnosis Date Noted   S/P laparoscopic-assisted sigmoidectomy 04/27/2023   Acute diverticulitis 11/20/2022   Lesion of urinary bladder 11/20/2022   Hypokalemia 11/20/2022   Irritability 07/07/2022   Frequent  urination 07/07/2022   Memory change 07/07/2022   Cracked skin on feet 02/06/2022   Facial skin lesion 02/06/2022   Oropharyngeal dysphagia 02/06/2022   Muscle cramping 02/06/2022   Left ear pain 02/06/2022   Arthralgia 04/01/2021   Chronic low back pain 03/11/2021   TIA (transient ischemic attack) 06/02/2020   Inguinal hernia 12/05/2019   History of CVA (cerebrovascular accident) without residual deficits 08/13/2019   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) 07/02/2019   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   Cervical stenosis of spinal canal 11/16/2018   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   Herpes zoster without complication 11/16/2018   Essential hypertension 11/01/2018   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/01/2018   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 11/01/2018   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    LOS: 2 days   Marin Olp, MD [facility], [facility]

## 2023-04-30 LAB — CBC
HCT: 38.8 % — ABNORMAL LOW (ref 39.0–52.0)
Hemoglobin: 12.6 g/dL — ABNORMAL LOW (ref 13.0–17.0)
MCH: 28.9 pg (ref 26.0–34.0)
MCHC: 32.5 g/dL (ref 30.0–36.0)
MCV: 89 fL (ref 80.0–100.0)
Platelets: 210 10*3/uL (ref 150–400)
RBC: 4.36 MIL/uL (ref 4.22–5.81)
RDW: 13.5 % (ref 11.5–15.5)
WBC: 10.7 10*3/uL — ABNORMAL HIGH (ref 4.0–10.5)
nRBC: 0 % (ref 0.0–0.2)

## 2023-04-30 LAB — BASIC METABOLIC PANEL
Anion gap: 7 (ref 5–15)
BUN: 19 mg/dL (ref 8–23)
CO2: 24 mmol/L (ref 22–32)
Calcium: 9.1 mg/dL (ref 8.9–10.3)
Chloride: 107 mmol/L (ref 98–111)
Creatinine, Ser: 1.07 mg/dL (ref 0.61–1.24)
GFR, Estimated: >60 mL/min (ref >60)
Glucose, Bld: 86 mg/dL (ref 70–99)
Potassium: 4 mmol/L (ref 3.5–5.1)
Sodium: 138 mmol/L (ref 135–145)

## 2023-04-30 NOTE — Discharge Summary (Signed)
 Physician Discharge Summary  Patient ID: William Wheeler MRN: [id number] DOB/AGE: May 14, 1950 73 y.o.  Admit date: 04/27/2023 Discharge date: 04/30/2023  Admission Diagnoses: diverticulitis  Discharge Diagnoses:  Principal Problem:   S/P laparoscopic-assisted sigmoidectomy   Discharged Condition: good  Hospital Course: He was admitted for routine postoperative care following robotic LAR on 04/27/2023.  He progressed well and was having bowel movements by postop day 2 which were loose.  His Plavix was resumed on postop day 2.  On postop day 3 he is ambulating unassisted, he is tolerating a diet without any nausea, bloating or pain, and reports pain is well-controlled with oral medications.  Reports continued loose bowel movements without any apparent bleeding.  He is stable for discharge home.  Discharge Exam: Blood pressure 123/80, pulse (!) 49, temperature 98 F (36.7 C), temperature source Oral, resp. rate 18, height 6\' 2"  (1.88 m), weight 92 kg, SpO2 98%. Alert well-appearing, comfortable Unlabored respirations Regular rate and rhythm Abdomen is soft, appropriately tender mostly around Pfannenstiel incision, nondistended.  Incisions remain clean, dry and intact without cellulitis or hematoma.  No hernia or other complicating feature.  Disposition: Discharge disposition: 01-Home or Self Care      Allergies as of 04/30/2023   No Known Allergies      Medication List     STOP taking these medications    amoxicillin-clavulanate 875-125 MG tablet Commonly known as: AUGMENTIN       TAKE these medications    acetaminophen 325 MG tablet Commonly known as: TYLENOL Take 650 mg by mouth every 6 (six) hours as needed for moderate pain (pain score 4-6).   ammonium lactate 12 % cream Commonly known as: AMLACTIN APPLY 1 APPLICATION TOPICALLY AS NEEDED FOR DRY SKIN.   atorvastatin 40 MG tablet Commonly known as: LIPITOR Take 1 tablet (40 mg total) by mouth daily.   B-12  PO Take 1 tablet by mouth daily.   clopidogrel 75 MG tablet Commonly known as: PLAVIX Take 1 tablet (75 mg total) by mouth daily.   escitalopram 10 MG tablet Commonly known as: LEXAPRO Take 1 tablet (10 mg total) by mouth daily.   Fish Oil 1000 MG Caps Take 1,000 mg by mouth daily.   losartan-hydrochlorothiazide 50-12.5 MG tablet Commonly known as: HYZAAR TAKE 1 TABLET EVERY DAY   Na Sulfate-K Sulfate-Mg Sulfate concentrate 17.5-3.13-1.6 GM/177ML Soln Commonly known as: SUPREP Use as directed; may use generic; goodrx card if insurance will not cover generic   traMADol 50 MG tablet Commonly known as: Ultram Take 1 tablet (50 mg total) by mouth every 6 (six) hours as needed for up to 5 days (postop pain not controlled with tylenol and ibuprofen first).        Follow-up Information     Andria Meuse, MD Follow up on 05/24/2023.   Specialties: General Surgery, Colon and Rectal Surgery Why: Please arrive by 9:10 am Contact information: [address] [place] [address] [phone]                 Signed: Berna Bue 04/30/2023, 9:19 AM

## 2023-05-01 ENCOUNTER — Telehealth: Payer: Self-pay

## 2023-05-01 LAB — SURGICAL PATHOLOGY

## 2023-05-01 NOTE — Transitions of Care (Post Inpatient/ED Visit) (Signed)
 05/01/2023  Name: Rhett Mutschler MRN: [id number] DOB: April 24, 1950  Today's TOC FU Call Status: Today's TOC FU Call Status:: Successful TOC FU Call Completed Patient's Name and Date of Birth confirmed.  Transition Care Management Follow-up Telephone Call Date of Discharge: 04/30/23 Discharge Facility: [facility]) Type of Discharge: Inpatient Admission Primary Inpatient Discharge Diagnosis:: diverticulitis How have you been since you were released from the hospital?: Better Any questions or concerns?: Yes (Patient denies) Patient Questions/Concerns:: Reviewed all discharge instructions with patient who asked about diet Patient Questions/Concerns Addressed: Other: (reviewed diet and when to call MD and to avoid constipation)  Items Reviewed: Did you receive and understand the discharge instructions provided?: Yes (Patient denied any questions and states he did read the discharge instructions) Medications obtained,verified, and reconciled?: Yes (Medications Reviewed) Any new allergies since your discharge?: No Dietary orders reviewed?: Yes Type of Diet Ordered:: Paitent can advance diet as tolerated - Currently avoid fast food/heavy meals, avoid raw/uncooked fruits/vegatables Do you have support at home?: Yes People in Home: spouse Name of Support/Comfort Primary Source: Patient states spouse helps as needed  Medications Reviewed Today: Medications Reviewed Today     Reviewed by Jessy Oto, RN (Registered Nurse) on 05/01/23 at 1026  Med List Status: <None>   Medication Order Taking? Sig Documenting Provider Last Dose Status Informant  acetaminophen (TYLENOL) 325 MG tablet [id number] Yes Take 650 mg by mouth every 6 (six) hours as needed for moderate pain (pain score 4-6). [provider] Taking Active Self  ammonium lactate (AMLACTIN) 12 % cream [id number] Yes APPLY 1 APPLICATION TOPICALLY AS NEEDED FOR DRY SKIN. McCaughan, Dia D, DPM Taking Active Self   atorvastatin (LIPITOR) 40 MG tablet [id number] Yes Take 1 tablet (40 mg total) by mouth daily. Everrett Coombe, DO Taking Active Self  clopidogrel (PLAVIX) 75 MG tablet [id number] Yes Take 1 tablet (75 mg total) by mouth daily. Everrett Coombe, DO Taking Active Self  Cyanocobalamin (B-12 PO) [id number] Yes Take 1 tablet by mouth daily. [provider] Taking Active Self  escitalopram (LEXAPRO) 10 MG tablet [id number] Yes Take 1 tablet (10 mg total) by mouth daily. Everrett Coombe, DO Taking Active   losartan-hydrochlorothiazide [facility]) 50-12.5 MG tablet [id number] Yes TAKE 1 TABLET EVERY DAY Everrett Coombe, DO Taking Active Self  Na Sulfate-K Sulfate-Mg Sulf 17.5-3.13-1.6 GM/177ML SOLN [id number] No Use as directed; may use generic; goodrx card if insurance will not cover generic  Patient not taking: Reported on 05/01/2023   Imogene Burn, MD Not Taking Consider Medication Status and Discontinue (Completed Course) Self  Omega-3 Fatty Acids (FISH OIL) 1000 MG CAPS [id number] Yes Take 1,000 mg by mouth daily. [provider] Taking Active Self  traMADol (ULTRAM) 50 MG tablet [id number] Yes Take 1 tablet (50 mg total) by mouth every 6 (six) hours as needed for up to 5 days (postop pain not controlled with tylenol and ibuprofen first). Andria Meuse, MD Taking Active             Home Care and Equipment/Supplies: Were Home Health Services Ordered?: No Any new equipment or medical supplies ordered?: No  Functional Questionnaire: Do you need assistance with bathing/showering or dressing?: No Do you need assistance with meal preparation?: No Do you need assistance with eating?: No Do you have difficulty maintaining continence: No Do you need assistance with getting out of bed/getting out of a chair/moving?: No Do you have difficulty managing or taking your medications?: No  Follow up appointments reviewed: PCP Follow-up appointment  confirmed?: Yes [facility] assisted with Care Guide  to schedule hospital follow up) Date of PCP follow-up appointment?: 05/09/23 Follow-up Provider: Everrett Coombe, DO Specialist Hospital Follow-up appointment confirmed?: Yes Date of Specialist follow-up appointment?: 05/24/23 Follow-Up Specialty Provider:: Surgeon, Andria Meuse Do you understand care options if your condition(s) worsen?: Yes-patient verbalized understanding  SDOH Interventions Today    Flowsheet Row Most Recent Value  SDOH Interventions   Food Insecurity Interventions Intervention Not Indicated  Housing Interventions Intervention Not Indicated  Transportation Interventions Intervention Not Indicated  Utilities Interventions Intervention Not Indicated       Goals Addressed               This Visit's Progress     Patient will report no readmissions in the next 30 days (pt-stated)        Current Barriers:  Knowledge deficit related to diet.  Patient reported questions regarding post surgery diet .  Knowledge deficit related to [facility] PCP Follow up appointment  RNCM Clinical Goal(s):  Patient will keep PCP Hospital Follow Up Appointment and Surgeon Follow Up Appointment Patient will follow diet instructions per discharge summary Patient will notify surgeon of any post surgical issues/questions/concerns Patient will take all medications as prescribed Patient will monitor surgical site and report any abnormal findings   Interventions: Evaluation of current treatment plan related to  self management and patient's adherence to plan as established by provider  Transitions of Care:  New goal. Doctor Visits  - discussed the importance of doctor visits and assisted in scheduled PCP hospital follow up  Surgery (ROBOTIC SIGMOIDECTOMY 2/20):  (Status: New goal.) Short Term Goal Reviewed post-operative instructions with patient/caregiver Addressed questions about post - surgical incision care  Reviewed medications with patient and addressed  questions Reviewed scheduled provider appointments with patient: PCP appointment 05/09/23 and surgeon appointment 05/24/23 Confirmed availability of transportation to all appointments: Patient denied any issues getting to his appointments Reviewed When to call us [phone]: Poor pain control, Reactions / problems with new medications (rash/itching, etc), Fever over 101.5 F (38.5 C), Inability to urinate, Nausea/vomiting, Worsening swelling or bruising, bleeding from incision, Increased pain, redness, or drainage from the incision  Patient Goals/Self-Care Activities: Participate in Transition of Care Program/Attend TOC scheduled calls Notify RN Care Manager of TOC call rescheduling needs Take all medications as prescribed Attend all scheduled provider appointments PCP and Surgeon Monitor incision daily and report any abnormal findings as stated Diet: Patient will include lots of fluids daily to stay hydrated - 64oz of water per day.  Avoid fast food or heavy meals for the first couple of weeks as your are more likely to get nauseated. Avoid raw/uncooked fruits or vegetables for the first 4 weeks (its ok to have these if they are blended into smoothie form). If you have fruits/vegetables, make sure they are cooked until soft enough to mash on the roof of your mouth and chew your food well. Patient will monitor bowel movements and takes steps to avoid constipation including: Increase fluid intake and taking a fiber supplement (such as Metamucil, Citrucel, FiberCon, MiraLax, etc), 1-2 times a day regularly will usually help prevent this problem from occurring. A mild laxative (prune juice, Milk of Magnesia, MiraLax, etc) should be taken according to package directions if there are no bowel movements after 48 hours.  Follow Up Plan:  Telephone follow up appointment with care management team member scheduled for:  05/10/23 10am The patient has been provided with contact information for the  care management  team and has been advised to call with any health related questions or concerns.         Hilbert Odor RN, CCM [place]  VBCI-Population Health RN Care Manager [phone]

## 2023-05-09 ENCOUNTER — Ambulatory Visit (INDEPENDENT_AMBULATORY_CARE_PROVIDER_SITE_OTHER): Payer: Medicare HMO | Admitting: Family Medicine

## 2023-05-09 ENCOUNTER — Encounter: Payer: Self-pay | Admitting: Family Medicine

## 2023-05-09 VITALS — BP 118/70 | HR 65 | Ht 74.0 in | Wt 207.0 lb

## 2023-05-09 DIAGNOSIS — Z9049 Acquired absence of other specified parts of digestive tract: Secondary | ICD-10-CM

## 2023-05-09 DIAGNOSIS — Z8673 Personal history of transient ischemic attack (TIA), and cerebral infarction without residual deficits: Secondary | ICD-10-CM

## 2023-05-09 DIAGNOSIS — I639 Cerebral infarction, unspecified: Secondary | ICD-10-CM

## 2023-05-09 DIAGNOSIS — R413 Other amnesia: Secondary | ICD-10-CM

## 2023-05-09 DIAGNOSIS — R454 Irritability and anger: Secondary | ICD-10-CM | POA: Diagnosis not present

## 2023-05-09 NOTE — Assessment & Plan Note (Signed)
Stable with Lexapro at current strength. 

## 2023-05-09 NOTE — Assessment & Plan Note (Signed)
 History of CVA.  He has noticed some increasing memory issues.  Referral placed to neurology.

## 2023-05-09 NOTE — Assessment & Plan Note (Signed)
 He has done well since having his sigmoidectomy.  Wounds are healing well at this time.  Bowels are moving normally.  He will continue to plan to see his surgeon for follow-up in a couple weeks.

## 2023-05-09 NOTE — Progress Notes (Signed)
 William Wheeler - 73 y.o. male MRN [id number]  Date of birth: May 07, 1950  Subjective Chief Complaint  Patient presents with   Hospitalization Follow-up    HPI William Wheeler is a 73 y.o. here today for follow up visit.   Recently had XI robotic assisted sigmoidectomy due to diverticulitis.  Reports he is doing well at this time.  Bowels are moving normally.  Appetite has been pretty good.  No significant pain at this time.  He sees his Careers adviser in a couple weeks.  ROS:  A comprehensive ROS was completed and negative except as noted per HPI  No Known Allergies  Past Medical History:  Diagnosis Date   Alcohol abuse    Alcohol addiction (HCC)    Arthritis    Colon polyps    Depression    Diverticulitis    Diverticulitis    History of colon polyps    History of hiatal hernia    Hypercholesteremia    Hypertension    Infectious colitis    Skin cancer    Stroke [facility])     Past Surgical History:  Procedure Laterality Date   broken bone repair     Nose   COLONOSCOPY     [facility] per patient about 15 years ago   FLEXIBLE SIGMOIDOSCOPY N/A 04/27/2023   Procedure: FLEXIBLE SIGMOIDOSCOPY;  Surgeon: Andria Meuse, MD;  Location: [facility];  Service: General;  Laterality: N/A;   HERNIA REPAIR     skin cancer removed  10/2021   forehead   TONSILLECTOMY     XI ROBOTIC ASSISTED LOWER ANTERIOR RESECTION N/A 04/27/2023   Procedure: XI ROBOTIC ASSISTED LOWER ANTERIOR RESECTION WITH COLORECTAL ANASTOMOSIS;  Surgeon: Andria Meuse, MD;  Location: [facility];  Service: General;  Laterality: N/A;    Social History   Socioeconomic History   Marital status: Married    Spouse name: Arta Bruce   Number of children: 2   Years of education: some college   Highest education level: Some college, no degree  Occupational History   Occupation: Scientist, research (physical sciences)  Tobacco Use   Smoking status: Former    Current packs/day: 0.00    Average packs/day: 2.5 packs/day for  29.0 years (72.5 ttl pk-yrs)    Types: Cigarettes    Start date: 79    Quit date: 2001    Years since quitting: 24.1   Smokeless tobacco: Never  Vaping Use   Vaping status: Never Used  Substance and Sexual Activity   Alcohol use: Not Currently   Drug use: Not Currently    Types: Marijuana    Comment: occasionally   Sexual activity: Not Currently    Partners: Female  Other Topics Concern   Not on file  Social History Narrative   Lives at home with his wife and two children. 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   Social Drivers of Corporate investment banker Strain: Low Risk  (07/05/2022)   Overall Financial Resource Strain (CARDIA)    Difficulty of Paying Living Expenses: Not hard at all  Food Insecurity: No Food Insecurity (05/01/2023)   Hunger Vital Sign    Worried About Running Out of Food in the Last Year: Never true    Ran Out of Food in the Last Year: Never true  Transportation Needs: No Transportation Needs (05/01/2023)   PRAPARE - Administrator, Civil Service (Medical): No    Lack of Transportation (Non-Medical): No  Physical Activity: Unknown (07/05/2022)  Exercise Vital Sign    Days of Exercise per Week: 0 days    Minutes of Exercise per Session: Not on file  Recent Concern: Physical Activity - Inactive (07/05/2022)   Exercise Vital Sign    Days of Exercise per Week: 0 days    Minutes of Exercise per Session: 0 min  Stress: Stress Concern Present (07/05/2022)   [employer] of Occupational Health - Occupational Stress Questionnaire    Feeling of Stress : Rather much  Social Connections: Moderately Isolated (04/27/2023)   Social Connection and Isolation Panel [NHANES]    Frequency of Communication with Friends and Family: Once a week    Frequency of Social Gatherings with Friends and Family: Never    Attends Religious Services: More than 4 times per year    Active Member of Clubs or Organizations: No    Attends Banker Meetings: Never     Marital Status: Married    Family History  Problem Relation Age of Onset   Leukemia Mother    Non-Hodgkin's lymphoma Mother    Other Father        unsure of medical history   Breast cancer Sister    Colon cancer Neg Hx    Esophageal cancer Neg Hx     Health Maintenance  Topic Date Due   Pneumonia Vaccine 67+ Years old (1 of 2 - PCV) Never done   Hepatitis C Screening  Never done   DTaP/Tdap/Td (1 - Tdap) Never done   Zoster Vaccines- Shingrix (2 of 2) 10/12/2019   COVID-19 Vaccine (5 - 2024-25 season) 11/06/2022   Medicare Annual Wellness (AWV)  02/10/2023   INFLUENZA VACCINE  06/05/2023 (Originally 10/06/2022)   Fecal DNA (Cologuard)  06/02/2023   HPV VACCINES  Aged Out   Colonoscopy  Discontinued     ----------------------------------------------------------------------------------------------------------------------------------------------------------------------------------------------------------------- Physical Exam BP 118/70 (BP Location: Left Arm, Patient Position: Sitting, Cuff Size: Normal)   Pulse 65   Ht 6\' 2"  (1.88 m)   Wt 207 lb (93.9 kg)   SpO2 97%   BMI 26.58 kg/m   Physical Exam Constitutional:      Appearance: Normal appearance.  HENT:     Head: Normocephalic and atraumatic.  Eyes:     General: No scleral icterus. Cardiovascular:     Rate and Rhythm: Normal rate and regular rhythm.  Pulmonary:     Effort: Pulmonary effort is normal.     Breath sounds: Normal breath sounds.  Musculoskeletal:     Cervical back: Neck supple.  Neurological:     Mental Status: He is alert.  Psychiatric:        Mood and Affect: Mood normal.        Behavior: Behavior normal.     ------------------------------------------------------------------------------------------------------------------------------------------------------------------------------------------------------------------- Assessment and Plan  S/P laparoscopic-assisted sigmoidectomy He has  done well since having his sigmoidectomy.  Wounds are healing well at this time.  Bowels are moving normally.  He will continue to plan to see his surgeon for follow-up in a couple weeks.  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  He has noticed some increasing memory issues.  Referral placed to neurology.  Irritability Stable with Lexapro at current strength.   No orders of the defined types were placed in this encounter.   No follow-ups on file.    This visit occurred during the SARS-CoV-2 public health emergency.  Safety protocols were in place, including screening questions prior to the visit, additional usage of staff PPE, and extensive  cleaning of exam room while observing appropriate contact time as indicated for disinfecting solutions.

## 2023-05-10 ENCOUNTER — Other Ambulatory Visit: Payer: Self-pay

## 2023-05-10 ENCOUNTER — Telehealth: Payer: Self-pay

## 2023-05-10 NOTE — Patient Outreach (Signed)
 Care Management  Transitions of Care Program Transitions of Care Post-discharge week 2   05/10/2023 Name: Morell Mears Wheeler MRN: [id number] DOB: Jul 24, 1950  Subjective: William Wheeler is a 73 y.o. year old male who is a primary care patient of Everrett Coombe, DO. The Care Management team Engaged with patient Engaged with patient by telephone to assess and address transitions of care needs.   Consent to Services:  Patient was given information about care management services, agreed to services, and gave verbal consent to participate.   Assessment:     Patient had PCP hospital follow up 05/09/23 as planned. Patient confirmed neurology referral related to memory concerns. Patient confirmed incisions are healing nicely with no signs/symptoms of infection and moving bowels without difficulty. Patient has surgeon follow up 05/24/23 and agrees to [facility] RN follow up call 05/18/23 and will call if any questions or concerns arise before next scheduled call.       SDOH Interventions    Flowsheet Row Office Visit from 05/09/2023 in [facility] Primary Care & Sports Medicine at [facility] Telephone from 05/01/2023 in [facility] POPULATION HEALTH DEPARTMENT Telephone from 11/25/2022 in [facility] Community Care Coordination Office Visit from 07/07/2022 in [facility] Primary Care & Sports Medicine at [facility] Office Visit from 04/01/2021 in [facility] Primary Care & Sports Medicine at [facility]  SDOH Interventions       Food Insecurity Interventions -- Intervention Not Indicated Intervention Not Indicated -- --  Housing Interventions -- Intervention Not Indicated -- -- --  Transportation Interventions -- Intervention Not Indicated Intervention Not Indicated -- --  Utilities Interventions -- Intervention Not Indicated -- -- --  Depression Interventions/Treatment  Medication -- -- Counseling Counseling        Goals Addressed                This Visit's Progress     TOC Care Plan - Patient will report no readmissions in the next 30 days (pt-stated)        Current Barriers:  Knowledge deficit related to diet. Patient reported questions regarding post surgery diet. 05/10/23 Reviewed diet and instructed patient to speak with surgeon at follow up appointment Knowledge deficit related to [facility] PCP Follow up appointment 05/10/23 Patient had his PCP follow up appointment 05/09/23  RNCM Clinical Goal(s):  Patient will keep PCP Hospital Follow Up Appointment and Surgeon Follow Up Appointment - 05/10/23 Goal met Patient will follow diet instructions per discharge summary  05/10/23 Goal met Patient will notify surgeon of any post surgical issues/questions/concerns 05/10/23 Ongoing Patient will take all medications as prescribed  05/10/23 Goal met Patient will monitor surgical site and report any abnormal findings 05/10/23 Ongoing   Interventions: Evaluation of current treatment plan related to  self management and patient's adherence to plan as established by provider 05/10/23 Ongoing  Transitions of Care:  Goal on track:  Yes. Doctor Visits  - discussed the importance of doctor visits and assisted in scheduled PCP hospital follow up  Surgery (ROBOTIC SIGMOIDECTOMY 2/20):  (Status: Goal on track:  Yes.) Short Term Goal Reviewed post-operative instructions with patient/caregiver 05/10/23 completed during previous call Addressed questions about post - surgical incision care 05/10/23 completed during previous call Reviewed medications with patient and addressed questions 05/10/23 Ongoing and will review with each call Reviewed scheduled provider appointments with patient: PCP appointment 05/09/23 - completed 05/09/23 and surgeon appointment 05/24/23 Confirmed availability of transportation to all appointments: Patient denied any issues getting  to his appointments 05/10/23 completed during previous call Reviewed When to call us [phone]: Poor pain control,  Reactions / problems with new medications (rash/itching, etc), Fever over 101.5 F (38.5 C), Inability to urinate, Nausea/vomiting, Worsening swelling or bruising, bleeding from incision, Increased pain, redness, or drainage from the incision 05/10/23 completed during previous call  Patient Goals/Self-Care Activities:  Participate in Transition of Care Program/Attend [facility] scheduled calls 05/10/23 Ongoing Notify RN Care Manager of TOC call rescheduling needs 05/10/23 Ongoing Take all medications as prescribed 05/10/23 Ongoing Attend all scheduled provider appointments PCP completed PCP appt 05/09/23  and Surgeon 05/24/23 appointment  Monitor incision daily and report any abnormal findings as stated3/5/25 Ongoing Diet: Patient will include lots of fluids daily to stay hydrated - 64oz of water per day.  Avoid fast food or heavy meals for the first couple of weeks as your are more likely to get nauseated. Avoid raw/uncooked fruits or vegetables for the first 4 weeks (its ok to have these if they are blended into smoothie form). If you have fruits/vegetables, make sure they are cooked until soft enough to mash on the roof of your mouth and chew your food well. 05/10/23 Ongoing Patient will monitor bowel movements and takes steps to avoid constipation including: Increase fluid intake and taking a fiber supplement (such as Metamucil, Citrucel, FiberCon, MiraLax, etc), 1-2 times a day regularly will usually help prevent this problem from occurring. A mild laxative (prune juice, Milk of Magnesia, MiraLax, etc) should be taken according to package directions if there are no bowel movements after 48 hours. 05/10/23 Ongoing  Follow Up Plan:  Telephone follow up appointment with care management team member scheduled for:  05/18/23 10am The patient has been provided with contact information for the care management team and has been advised to call with any health related questions or concerns.          Plan: Telephone follow up  appointment with care management team member scheduled for: 05/18/23 10am The patient has been provided with contact information for the care management team and has been advised to call with any health related questions or concerns.   Hilbert Odor RN, CCM [place]  VBCI-Population Health RN Care Manager [phone]

## 2023-05-18 ENCOUNTER — Other Ambulatory Visit: Payer: Self-pay

## 2023-05-18 ENCOUNTER — Telehealth: Payer: Self-pay

## 2023-05-18 NOTE — Patient Instructions (Signed)
 Visit Information  Thank you for taking time to visit with me today. Please don't hesitate to contact me if I can be of assistance to you before our next scheduled telephone appointment.   Our next appointment is by telephone on 3//20/25 at 9am  Following is a copy of your care plan:   Goals Addressed               This Visit's Progress     TOC Care Plan - Patient will report no readmissions in the next 30 days (pt-stated)        Current Barriers:  Knowledge deficit related to diet. Patient reported questions regarding post surgery diet. 05/18/23 Patient reports he is doing well with his diet and is having no issues   RNCM Clinical Goal(s):  Patient will notify surgeon of any post surgical issues/questions/concerns 05/18/23 Ongoing - Patient has follow up appointment with surgeon, Dr Marin Olp 05/24/23 Patient will monitor surgical site and report any abnormal findings 05/18/23 Ongoing - patient reports the last of the glue came off this morning - denies signs/symptoms infection   Interventions: Evaluation of current treatment plan related to  self management and patient's adherence to plan as established by provider 05/18/23 Ongoing  Transitions of Care:  Goal on track:  Yes. Doctor Visits  - discussed the importance of doctor visits and assisted in scheduled PCP hospital follow up - PCP follow up completed - surgeon follow up scheduled 05/24/23  Surgery (ROBOTIC SIGMOIDECTOMY 2/20):  (Status: Goal on track:  Yes.) Short Term Goal Reviewed medications with patient and addressed questions 05/18/23 Ongoing and will review with each call Reviewed scheduled provider appointments with patient: PCP appointment 05/09/23 - completed 05/09/23 and surgeon appointment 05/24/23 - will follow up with patient 05/25/23  Patient Goals/Self-Care Activities:  Participate in Transition of Care Program/Attend [facility] scheduled calls 05/18/23 Ongoing Notify RN Care Manager of TOC call rescheduling needs  05/18/23 Ongoing Take all medications as prescribed 05/18/23 Ongoing Attend all scheduled provider appointments - Surgeon, Dr Marin Olp 05/24/23 appointment  Monitor incision daily and report any abnormal findings as stated3/13/25 Ongoing Diet: Patient will include lots of fluids daily to stay hydrated - 64oz of water per day.  Avoid fast food or heavy meals for the first couple of weeks as your are more likely to get nauseated. Avoid raw/uncooked fruits or vegetables for the first 4 weeks (its ok to have these if they are blended into smoothie form). If you have fruits/vegetables, make sure they are cooked until soft enough to mash on the roof of your mouth and chew your food well. 05/18/23 Ongoing Patient will monitor bowel movements and takes steps to avoid constipation including: Increase fluid intake and taking a fiber supplement (such as Metamucil, Citrucel, FiberCon, MiraLax, etc), 1-2 times a day regularly will usually help prevent this problem from occurring. A mild laxative (prune juice, Milk of Magnesia, MiraLax, etc) should be taken according to package directions if there are no bowel movements after 48 hours. 05/18/23 Ongoing - Patient reports he is moving his bowels with no issues   Follow Up Plan:  Telephone follow up appointment with care management team member scheduled for:  05/25/23 9am The patient has been provided with contact information for the care management team and has been advised to call with any health related questions or concerns.          Patient verbalizes understanding of instructions and care plan provided today and agrees to view in MyChart.  Active MyChart status and patient understanding of how to access instructions and care plan via MyChart confirmed with patient.     Telephone follow up appointment with care management team member scheduled for: 05/25/23 9am The patient has been provided with contact information for the care management team and has been  advised to call with any health related questions or concerns.   Please call the care guide team at [phone] if you need to cancel or reschedule your appointment.   Please call the Suicide and Crisis Lifeline: 988 call the [place] National Suicide Prevention Lifeline: 5754906992 or TTY: (361) 741-2195 TTY 251-186-2516) to talk to a trained counselor call 1-800-273-TALK (toll free, 24 hour hotline) call 911 if you are experiencing a Mental Health or Behavioral Health Crisis or need someone to talk to. Hilbert Odor RN, CCM [place]  VBCI-Population Health RN Care Manager [phone]

## 2023-05-18 NOTE — Patient Outreach (Signed)
 Care Management  Transitions of Care Program Transitions of Care Post-discharge week 3   05/18/2023 Name: William Wheeler MRN: [id number] DOB: 1951/02/24  Subjective: William Wheeler is a 73 y.o. year old male who is a primary care patient of Everrett Coombe, DO. The Care Management team Engaged with patient Engaged with patient by telephone to assess and address transitions of care needs.   Consent to Services:  Patient was given information about care management services, agreed to services, and gave verbal consent to participate.   Assessment:     Patient reports he continues to do well post surgery. Patient states he has been increasing his activity. TOC RN reviewed activity post discharge instructions and patient confirms he is still not lifting but did do some housework. Patient endorses being a little sore today and TOC RN reinforced activity as tolerated and not to overdo it. Patient reports he is eating and moving bowels without issue and is no longer taking Tylenol.  Patient has a trip planned to visit his son in [place] after he sees surgeon 05/24/23 and agreed to [facility] RN call 3/220/25 prior to leaving for his trip.       SDOH Interventions    Flowsheet Row Office Visit from 05/09/2023 in [facility] Primary Care & Sports Medicine at [facility] Telephone from 05/01/2023 in [facility] POPULATION HEALTH DEPARTMENT Telephone from 11/25/2022 in [facility] Community Care Coordination Office Visit from 07/07/2022 in [facility] Primary Care & Sports Medicine at [facility] Office Visit from 04/01/2021 in [facility] Primary Care & Sports Medicine at [facility]  SDOH Interventions       Food Insecurity Interventions -- Intervention Not Indicated Intervention Not Indicated -- --  Housing Interventions -- Intervention Not Indicated -- -- --  Transportation Interventions -- Intervention Not Indicated Intervention Not Indicated -- --   Utilities Interventions -- Intervention Not Indicated -- -- --  Depression Interventions/Treatment  Medication -- -- Counseling Counseling        Goals Addressed               This Visit's Progress     TOC Care Plan - Patient will report no readmissions in the next 30 days (pt-stated)        Current Barriers:  Knowledge deficit related to diet. Patient reported questions regarding post surgery diet. 05/18/23 Patient reports he is doing well with his diet and is having no issues   RNCM Clinical Goal(s):  Patient will notify surgeon of any post surgical issues/questions/concerns 05/18/23 Ongoing - Patient has follow up appointment with surgeon, Dr Marin Olp 05/24/23 Patient will monitor surgical site and report any abnormal findings 05/18/23 Ongoing - patient reports the last of the glue came off this morning - denies signs/symptoms infection   Interventions: Evaluation of current treatment plan related to  self management and patient's adherence to plan as established by provider 05/18/23 Ongoing  Transitions of Care:  Goal on track:  Yes. Doctor Visits  - discussed the importance of doctor visits and assisted in scheduled PCP hospital follow up - PCP follow up completed - surgeon follow up scheduled 05/24/23  Surgery (ROBOTIC SIGMOIDECTOMY 2/20):  (Status: Goal on track:  Yes.) Short Term Goal Reviewed medications with patient and addressed questions 05/18/23 Ongoing and will review with each call Reviewed scheduled provider appointments with patient: PCP appointment 05/09/23 - completed 05/09/23 and surgeon appointment 05/24/23 - will follow up with patient 05/25/23  Patient Goals/Self-Care Activities:  Participate in Transition of Care Program/Attend [facility] scheduled calls 05/18/23 Ongoing Notify RN Care Manager of TOC call rescheduling needs 05/18/23 Ongoing Take all medications as prescribed 05/18/23 Ongoing Attend all scheduled provider appointments - Surgeon, Dr Marin Olp  05/24/23 appointment  Monitor incision daily and report any abnormal findings as stated3/13/25 Ongoing Diet: Patient will include lots of fluids daily to stay hydrated - 64oz of water per day.  Avoid fast food or heavy meals for the first couple of weeks as your are more likely to get nauseated. Avoid raw/uncooked fruits or vegetables for the first 4 weeks (its ok to have these if they are blended into smoothie form). If you have fruits/vegetables, make sure they are cooked until soft enough to mash on the roof of your mouth and chew your food well. 05/18/23 Ongoing Patient will monitor bowel movements and takes steps to avoid constipation including: Increase fluid intake and taking a fiber supplement (such as Metamucil, Citrucel, FiberCon, MiraLax, etc), 1-2 times a day regularly will usually help prevent this problem from occurring. A mild laxative (prune juice, Milk of Magnesia, MiraLax, etc) should be taken according to package directions if there are no bowel movements after 48 hours. 05/18/23 Ongoing - Patient reports he is moving his bowels with no issues   Follow Up Plan:  Telephone follow up appointment with care management team member scheduled for:  05/25/23 9am The patient has been provided with contact information for the care management team and has been advised to call with any health related questions or concerns.          Plan: Telephone follow up appointment with care management team member scheduled for: 05/25/23 9am The patient has been provided with contact information for the care management team and has been advised to call with any health related questions or concerns.   Hilbert Odor RN, CCM [place]  VBCI-Population Health RN Care Manager [phone]

## 2023-05-25 ENCOUNTER — Other Ambulatory Visit: Payer: Self-pay

## 2023-05-25 NOTE — Patient Outreach (Signed)
 Care Management  Transitions of Care Program Transitions of Care Post-discharge week 4 Case Closure Note   05/25/2023 Name: William Wheeler MRN: [id number] DOB: 1950-09-12  Subjective: William Wheeler is a 73 y.o. year old male who is a primary care patient of Everrett Coombe, DO. The Care Management team Engaged with patient Engaged with patient by telephone to assess and address transitions of care needs.   Consent to Services:  Patient was given information about care management services, agreed to services, and gave verbal consent to participate.   Assessment:     Per record and patient report, patient had surgeon follow up as planned 05/24/23 and states he has been cleared. Patient thanked [facility] RN for the calls and support throughout his recovery. Patient feels he is doing well and denied any needs. TOC Program is being closed.       SDOH Interventions    Flowsheet Row Office Visit from 05/09/2023 in [facility] Primary Care & Sports Medicine at [facility] Telephone from 05/01/2023 in [facility] POPULATION HEALTH DEPARTMENT Telephone from 11/25/2022 in [facility] Community Care Coordination Office Visit from 07/07/2022 in [facility] Primary Care & Sports Medicine at [facility] Office Visit from 04/01/2021 in [facility] Primary Care & Sports Medicine at [facility]  SDOH Interventions       Food Insecurity Interventions -- Intervention Not Indicated Intervention Not Indicated -- --  Housing Interventions -- Intervention Not Indicated -- -- --  Transportation Interventions -- Intervention Not Indicated Intervention Not Indicated -- --  Utilities Interventions -- Intervention Not Indicated -- -- --  Depression Interventions/Treatment  Medication -- -- Counseling Counseling        Goals Addressed               This Visit's Progress     COMPLETED: TOC Care Plan - Patient will report no readmissions in the next 30 days  (pt-stated)        Current Barriers: Goals MET - No further calls needed  Knowledge deficit related to diet. Patient reported questions regarding post surgery diet. 05/25/23 Patient reports he is doing well with his diet and is having no issues   RNCM Clinical Goal(s): 05/25/23 Patient had surgeon appointment as planned and has been cleared and feels goals are met - Patient thanked [facility] RN for the calls Patient will notify surgeon of any post surgical issues/questions/concerns 05/18/23 Ongoing - Patient has follow up appointment with surgeon, Dr Marin Olp 05/24/23 - 05/25/23 Patient had surgeon appointment as planned and has been cleared and feels goals are met - Patient thanked [facility] RN for the calls Patient will monitor surgical site and report any abnormal findings 05/18/23 Ongoing - patient reports the last of the glue came off this morning - denies signs/symptoms infection 05/25/23 Patient had surgeon appointment as planned and has been cleared and feels goals are met - Patient thanked [facility] RN for the calls   Interventions:05/25/23 Patient had surgeon appointment as planned and has been cleared and feels goals are met - Patient thanked [facility] RN for the calls Evaluation of current treatment plan related to  self management and patient's adherence to plan as established by provider   Transitions of Care:  Goal Met. Doctor Visits  - discussed the importance of doctor visits and assisted in scheduled PCP hospital follow up - PCP follow up completed - surgeon follow up scheduled 05/24/23 05/25/23 Patient had surgeon appointment as planned and has been cleared and  feels goals are met - Patient thanked [facility] RN for the calls  Surgery (ROBOTIC SIGMOIDECTOMY 2/20):  (Status: Goal Met.) Short Term Goal Reviewed medications with patient and addressed questions 05/25/23 patient denied any medication changes  Reviewed scheduled provider appointments with patient: PCP appointment 05/09/23 - completed 05/09/23 and surgeon  appointment 05/24/23 - 05/25/23 Patient had surgeon appointment as planned and has been cleared and feels goals are met - Patient thanked [facility] RN for the calls  Patient Goals/Self-Care Activities: 05/25/23 Patient had surgeon appointment as planned and has been cleared and feels goals are met - Patient thanked [facility] RN for the calls Participate in Transition of Care Program/Attend [facility] scheduled calls Notify RN Care Manager of [facility] call rescheduling needs  Take all medications as prescribed  Attend all scheduled provider appointments - Surgeon, Dr Marin Olp 05/24/23 appointment- 05/25/23 Patient had surgeon appointment as planned and has been cleared and feels goals are met - Patient thanked [facility] RN for the calls Monitor incision daily and report any abnormal findings as stated Diet: Patient will include lots of fluids daily to stay hydrated - 64oz of water per day.  Avoid fast food or heavy meals for the first couple of weeks as your are more likely to get nauseated. Avoid raw/uncooked fruits or vegetables for the first 4 weeks (its ok to have these if they are blended into smoothie form). If you have fruits/vegetables, make sure they are cooked until soft enough to mash on the roof of your mouth and chew your food well. Patient will monitor bowel movements and takes steps to avoid constipation including: Increase fluid intake and taking a fiber supplement (such as Metamucil, Citrucel, FiberCon, MiraLax, etc), 1-2 times a day regularly will usually help prevent this problem from occurring. A mild laxative (prune juice, Milk of Magnesia, MiraLax, etc) should be taken according to package directions if there are no bowel movements after 48 hours.   Follow Up Plan:  05/25/23 Patient had surgeon appointment as planned and has been cleared and feels goals are met - Patient thanked [facility] RN for the calls The patient has been provided with contact information for the care management team and has been advised to call  with any health related questions or concerns.          Plan: The patient has been provided with contact information for the care management team and has been advised to call with any health related questions or concerns.   Hilbert Odor RN, CCM [place]  VBCI-Population Health RN Care Manager [phone]

## 2023-05-25 NOTE — Patient Instructions (Signed)
 Visit Information  Thank you for taking time to visit with me today. Please don't hesitate to contact me if I can be of assistance to you. It has been a pleasure speaking with you.    Following is a copy of your care plan:   Goals Addressed               This Visit's Progress     COMPLETED: TOC Care Plan - Patient will report no readmissions in the next 30 days (pt-stated)        Current Barriers: Goals MET - No further calls needed  Knowledge deficit related to diet. Patient reported questions regarding post surgery diet. 05/25/23 Patient reports he is doing well with his diet and is having no issues   RNCM Clinical Goal(s): 05/25/23 Patient had surgeon appointment as planned and has been cleared and feels goals are met - Patient thanked [facility] RN for the calls Patient will notify surgeon of any post surgical issues/questions/concerns 05/18/23 Ongoing - Patient has follow up appointment with surgeon, Dr Marin Olp 05/24/23 - 05/25/23 Patient had surgeon appointment as planned and has been cleared and feels goals are met - Patient thanked [facility] RN for the calls Patient will monitor surgical site and report any abnormal findings 05/18/23 Ongoing - patient reports the last of the glue came off this morning - denies signs/symptoms infection 05/25/23 Patient had surgeon appointment as planned and has been cleared and feels goals are met - Patient thanked [facility] RN for the calls   Interventions:05/25/23 Patient had surgeon appointment as planned and has been cleared and feels goals are met - Patient thanked [facility] RN for the calls Evaluation of current treatment plan related to  self management and patient's adherence to plan as established by provider   Transitions of Care:  Goal Met. Doctor Visits  - discussed the importance of doctor visits and assisted in scheduled PCP hospital follow up - PCP follow up completed - surgeon follow up scheduled 05/24/23 05/25/23 Patient had surgeon appointment as planned  and has been cleared and feels goals are met - Patient thanked [facility] RN for the calls  Surgery (ROBOTIC SIGMOIDECTOMY 2/20):  (Status: Goal Met.) Short Term Goal Reviewed medications with patient and addressed questions 05/25/23 patient denied any medication changes  Reviewed scheduled provider appointments with patient: PCP appointment 05/09/23 - completed 05/09/23 and surgeon appointment 05/24/23 - 05/25/23 Patient had surgeon appointment as planned and has been cleared and feels goals are met - Patient thanked [facility] RN for the calls  Patient Goals/Self-Care Activities: 05/25/23 Patient had surgeon appointment as planned and has been cleared and feels goals are met - Patient thanked [facility] RN for the calls Participate in Transition of Care Program/Attend [facility] scheduled calls Notify RN Care Manager of [facility] call rescheduling needs  Take all medications as prescribed  Attend all scheduled provider appointments - Surgeon, Dr Marin Olp 05/24/23 appointment- 05/25/23 Patient had surgeon appointment as planned and has been cleared and feels goals are met - Patient thanked [facility] RN for the calls Monitor incision daily and report any abnormal findings as stated Diet: Patient will include lots of fluids daily to stay hydrated - 64oz of water per day.  Avoid fast food or heavy meals for the first couple of weeks as your are more likely to get nauseated. Avoid raw/uncooked fruits or vegetables for the first 4 weeks (its ok to have these if they are blended into smoothie form). If you have fruits/vegetables, make sure they  are cooked until soft enough to mash on the roof of your mouth and chew your food well. Patient will monitor bowel movements and takes steps to avoid constipation including: Increase fluid intake and taking a fiber supplement (such as Metamucil, Citrucel, FiberCon, MiraLax, etc), 1-2 times a day regularly will usually help prevent this problem from occurring. A mild laxative (prune juice, Milk of Magnesia,  MiraLax, etc) should be taken according to package directions if there are no bowel movements after 48 hours.   Follow Up Plan:  05/25/23 Patient had surgeon appointment as planned and has been cleared and feels goals are met - Patient thanked [facility] RN for the calls The patient has been provided with contact information for the care management team and has been advised to call with any health related questions or concerns.          Patient verbalizes understanding of instructions and care plan provided today and agrees to view in MyChart. Active MyChart status and patient understanding of how to access instructions and care plan via MyChart confirmed with patient.     The patient has been provided with contact information for the care management team and has been advised to call with any health related questions or concerns.    Please call the Suicide and Crisis Lifeline: 988 call the [place] National Suicide Prevention Lifeline: (309) 568-8204 or TTY: 339-319-3287 TTY (613) 338-7177) to talk to a trained counselor call 1-800-273-TALK (toll free, 24 hour hotline) call 911 if you are experiencing a Mental Health or Behavioral Health Crisis or need someone to talk to.  Hilbert Odor RN, CCM [place]  VBCI-Population Health RN Care Manager [phone]

## 2023-05-30 ENCOUNTER — Encounter

## 2023-06-08 ENCOUNTER — Other Ambulatory Visit: Payer: Self-pay

## 2023-06-08 DIAGNOSIS — R454 Irritability and anger: Secondary | ICD-10-CM

## 2023-06-08 MED ORDER — ESCITALOPRAM OXALATE 10 MG PO TABS: 10.0000 mg | ORAL_TABLET | Freq: Every day | ORAL | 0 refills | Status: DC

## 2023-06-19 ENCOUNTER — Other Ambulatory Visit: Payer: Self-pay | Admitting: Family Medicine

## 2023-06-19 DIAGNOSIS — R454 Irritability and anger: Secondary | ICD-10-CM

## 2023-06-19 DIAGNOSIS — I639 Cerebral infarction, unspecified: Secondary | ICD-10-CM

## 2023-06-24 ENCOUNTER — Other Ambulatory Visit: Payer: Self-pay | Admitting: Family Medicine

## 2023-06-24 DIAGNOSIS — G459 Transient cerebral ischemic attack, unspecified: Secondary | ICD-10-CM

## 2023-06-29 ENCOUNTER — Encounter

## 2023-07-20 ENCOUNTER — Ambulatory Visit

## 2023-07-20 VITALS — Ht 74.0 in | Wt 205.0 lb

## 2023-07-20 DIAGNOSIS — Z0000 Encounter for general adult medical examination without abnormal findings: Secondary | ICD-10-CM | POA: Diagnosis not present

## 2023-07-20 NOTE — Patient Instructions (Signed)
  William Wheeler , Thank you for taking time to come for your Medicare Wellness Visit. I appreciate your ongoing commitment to your health goals. Please review the following plan we discussed and let me know if I can assist you in the future.   These are the goals we discussed:  Goals       Patient Stated (pt-stated)      Patient would like to loose 20 lbs.      Patient Stated      Patient states he would like to lose some weight.         This is a list of the screening recommended for you and due dates:  Health Maintenance  Topic Date Due   Hepatitis C Screening  Never done   DTaP/Tdap/Td vaccine (1 - Tdap) Never done   Pneumonia Vaccine (1 of 2 - PCV) Never done   Zoster (Shingles) Vaccine (2 of 2) 10/12/2019   COVID-19 Vaccine (6 - 2024-25 season) 11/06/2022   Cologuard (Stool DNA test)  06/02/2023   Flu Shot  10/06/2023   Medicare Annual Wellness Visit  07/19/2024   HPV Vaccine  Aged Out   Meningitis B Vaccine  Aged Out   Colon Cancer Screening  Discontinued

## 2023-07-20 NOTE — Progress Notes (Signed)
 Subjective:   William Wheeler is a 73 y.o. male who presents for Medicare Annual/Subsequent preventive examination.  Visit Complete: Virtual I connected with  William Wheeler on 07/20/23 by a audio enabled telemedicine application and verified that I am speaking with the correct person using two identifiers.  Patient Location: Home  Provider Location: Office/Clinic  I discussed the limitations of evaluation and management by telemedicine. The patient expressed understanding and agreed to proceed.  Vital Signs: Because this visit was a virtual/telehealth visit, some criteria may be missing or patient reported. Any vitals not documented were not able to be obtained and vitals that have been documented are patient reported.  Patient Medicare AWV questionnaire was completed by the patient on 07/19/2023; I have confirmed that all information answered by patient is correct and no changes since this date.  Cardiac Risk Factors include: advanced age (>37men, >2 women);male gender;obesity (BMI >30kg/m2);smoking/ tobacco exposure;dyslipidemia;hypertension     Objective:    Today's Vitals   07/20/23 0952  Weight: 205 lb (93 kg)  Height: 6\' 2"  (1.88 m)   Body mass index is 26.32 kg/m.     07/20/2023    9:58 AM 04/27/2023   11:04 AM 04/20/2023    9:03 AM 11/20/2022    7:56 PM 11/20/2022    6:00 PM 11/20/2022   12:58 PM 11/01/2022    6:12 PM  Advanced Directives  Does Patient Have a Medical Advance Directive? Yes No No  No No No  Type of Estate agent of [place];Living will        Does patient want to make changes to medical advance directive? No - Patient declined        Copy of Healthcare Power of Attorney in Chart? No - copy requested        Would patient like information on creating a medical advance directive?  No - Patient declined No - Patient declined No - Patient declined       Current Medications (verified) Outpatient Encounter  Medications as of 07/20/2023  Medication Sig   acetaminophen  (TYLENOL ) 325 MG tablet Take 650 mg by mouth every 6 (six) hours as needed for moderate pain (pain score 4-6).   ammonium lactate  (AMLACTIN) 12 % cream APPLY 1 APPLICATION TOPICALLY AS NEEDED FOR DRY SKIN.   atorvastatin  (LIPITOR) 40 MG tablet TAKE 1 TABLET EVERY DAY   clopidogrel  (PLAVIX ) 75 MG tablet TAKE 1 TABLET EVERY DAY   Cyanocobalamin  (B-12 PO) Take 1 tablet by mouth daily.   escitalopram  (LEXAPRO ) 10 MG tablet TAKE 1 TABLET EVERY DAY   losartan -hydrochlorothiazide  (HYZAAR) 50-12.5 MG tablet TAKE 1 TABLET EVERY DAY   Omega-3 Fatty Acids (FISH OIL) 1000 MG CAPS Take 1,000 mg by mouth daily.   [DISCONTINUED] Na Sulfate-K Sulfate-Mg Sulf 17.5-3.13-1.6 GM/177ML SOLN Use as directed; may use generic; goodrx card if insurance will not cover generic (Patient not taking: Reported on 05/18/2023)   No facility-administered encounter medications on file as of 07/20/2023.    Allergies (verified) Patient has no known allergies.   History: Past Medical History:  Diagnosis Date   Alcohol abuse    Alcohol addiction (HCC)    Arthritis    Colon polyps    Depression    Diverticulitis    Diverticulitis    History of colon polyps    History of hiatal hernia    Hypercholesteremia    Hypertension    Infectious colitis    Skin cancer    Stroke (HCC)  Past Surgical History:  Procedure Laterality Date   broken bone repair     Nose   COLONOSCOPY     [facility] per patient about 15 years ago   FLEXIBLE SIGMOIDOSCOPY N/A 04/27/2023   Procedure: FLEXIBLE SIGMOIDOSCOPY;  Surgeon: Melvenia Stabs, MD;  Location: [facility];  Service: General;  Laterality: N/A;   HERNIA REPAIR     skin cancer removed  10/2021   forehead   TONSILLECTOMY     XI ROBOTIC ASSISTED LOWER ANTERIOR RESECTION N/A 04/27/2023   Procedure: XI ROBOTIC ASSISTED LOWER ANTERIOR RESECTION WITH COLORECTAL ANASTOMOSIS;  Surgeon: Melvenia Stabs, MD;  Location: [facility];   Service: General;  Laterality: N/A;   Family History  Problem Relation Age of Onset   Leukemia Mother    Non-Hodgkin's lymphoma Mother    Other Father        unsure of medical history   Breast cancer Sister    Colon cancer Neg Hx    Esophageal cancer Neg Hx    Social History   Socioeconomic History   Marital status: Married    Spouse name: Jabarri Interrante   Number of children: 2   Years of education: some college   Highest education level: Associate degree: occupational, Scientist, product/process development, or vocational program  Occupational History   Occupation: Scientist, research (physical sciences)  Tobacco Use   Smoking status: Former    Current packs/day: 0.00    Average packs/day: 2.5 packs/day for 29.0 years (72.5 ttl pk-yrs)    Types: Cigarettes    Start date: 47    Quit date: 2001    Years since quitting: 24.3   Smokeless tobacco: Never  Vaping Use   Vaping status: Never Used  Substance and Sexual Activity   Alcohol use: Not Currently   Drug use: Not Currently    Types: Marijuana    Comment: occasionally   Sexual activity: Not Currently    Partners: Female  Other Topics Concern   Not on file  Social History Narrative   Lives at home with his wife. 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    Social Drivers of Corporate investment banker Strain: Low Risk  (07/20/2023)   Overall Financial Resource Strain (CARDIA)    Difficulty of Paying Living Expenses: Not hard at all  Food Insecurity: No Food Insecurity (07/20/2023)   Hunger Vital Sign    Worried About Running Out of Food in the Last Year: Never true    Ran Out of Food in the Last Year: Never true  Transportation Needs: No Transportation Needs (07/20/2023)   PRAPARE - Administrator, Civil Service (Medical): No    Lack of Transportation (Non-Medical): No  Physical Activity: Insufficiently Active (07/20/2023)   Exercise Vital Sign    Days of Exercise per Week: 1 day    Minutes of Exercise per Session: 30 min  Stress: No Stress Concern  Present (07/20/2023)   [employer] of Occupational Health - Occupational Stress Questionnaire    Feeling of Stress : Only a little  Recent Concern: Stress - Stress Concern Present (07/19/2023)   [employer] of Occupational Health - Occupational Stress Questionnaire    Feeling of Stress : To some extent  Social Connections: Moderately Integrated (07/20/2023)   Social Connection and Isolation Panel [NHANES]    Frequency of Communication with Friends and Family: More than three times a week    Frequency of Social Gatherings with Friends and Family: Never    Attends Religious Services: More than  4 times per year    Active Member of Clubs or Organizations: No    Attends Banker Meetings: Never    Marital Status: Married  Recent Concern: Social Connections - Moderately Isolated (04/27/2023)   Social Connection and Isolation Panel [NHANES]    Frequency of Communication with Friends and Family: Once a week    Frequency of Social Gatherings with Friends and Family: Never    Attends Religious Services: More than 4 times per year    Active Member of [employer] or Organizations: No    Attends Engineer, structural: Never    Marital Status: Married    Tobacco Counseling Counseling given: Not Answered   Clinical Intake:  Pre-visit preparation completed: Yes  Pain : No/denies pain     BMI - recorded: 26.32 Nutritional Status: BMI 25 -29 Overweight Nutritional Risks: None Diabetes: No  How often do you need to have someone help you when you read instructions, pamphlets, or other written materials from your doctor or pharmacy?: 1 - Never What is the last grade level you completed in school?: 14  Interpreter Needed?: No      Activities of Daily Living    07/20/2023    9:53 AM 07/19/2023    8:37 PM  In your present state of health, do you have any difficulty performing the following activities:  Hearing? 1 1  Vision? 1 1  Difficulty concentrating or making  decisions? 1 1  Walking or climbing stairs? 0 0  Dressing or bathing? 0 0  Doing errands, shopping? 0 0  Preparing Food and eating ? N N  Using the Toilet? N N  In the past six months, have you accidently leaked urine? Y Y  Do you have problems with loss of bowel control? N N  Managing your Medications? N N  Managing your Finances? N N  Housekeeping or managing your Housekeeping? N N    Patient Care Team: Adela Holter, DO as PCP - General (Family Medicine) Sharmaine Dearth, RN as Registered Nurse Jennefer Moats, DPM as Consulting Physician (Podiatry)  Indicate any recent Medical Services you may have received from other than [facility] providers in the past year (date may be approximate).     Assessment:   This is a routine wellness examination for [place].  Hearing/Vision screen No results found.   Goals Addressed             This Visit's Progress    Patient Stated       Patient states he would like to lose some weight.       Depression Screen    07/20/2023    9:57 AM 05/09/2023    2:53 PM 08/04/2022    9:01 AM 07/07/2022    9:58 AM 02/09/2022    9:37 AM 02/03/2022    3:13 PM 04/01/2021    9:41 AM  PHQ 2/9 Scores  PHQ - 2 Score 0 4 0 5 0 0 2  PHQ- 9 Score  11 4 16   8     Fall Risk    07/20/2023    9:58 AM 07/19/2023    8:37 PM 07/07/2022    9:58 AM 02/09/2022    9:36 AM 02/03/2022    3:13 PM  Fall Risk   Falls in the past year? 0 1 0 0 0  Number falls in past yr: 0  0 0 0  Injury with Fall? 0  0 0 0  Risk for fall due to :  No Fall Risks  No Fall Risks No Fall Risks No Fall Risks  Follow up Falls evaluation completed  Falls evaluation completed Falls evaluation completed Falls evaluation completed    MEDICARE RISK AT HOME: Medicare Risk at Home Any stairs in or around the home?: Yes If so, are there any without handrails?: Yes Home free of loose throw rugs in walkways, pet beds, electrical cords, etc?: Yes Adequate lighting in your home to reduce risk of  falls?: Yes Life alert?: No Use of a cane, walker or w/c?: No Grab bars in the bathroom?: No Shower chair or bench in shower?: No Elevated toilet seat or a handicapped toilet?: No  TIMED UP AND GO:  Was the test performed?  No    Cognitive Function:        07/20/2023    9:59 AM 02/09/2022    9:42 AM  6CIT Screen  What Year? 0 points 0 points  What month? 0 points 0 points  What time? 0 points 0 points  Count back from 20 0 points 0 points  Months in reverse 0 points 0 points  Repeat phrase 0 points 0 points  Total Score 0 points 0 points    Immunizations Immunization History  Administered Date(s) Administered   Fluad Quad(high Dose 65+) 11/15/2018   Influenza-Unspecified 01/06/2020   PFIZER(Purple Top)SARS-COV-2 Vaccination 04/27/2019, 05/21/2019, 12/07/2019, 09/30/2020   Unspecified SARS-COV-2 Vaccination 02/26/2022   Zoster Recombinant(Shingrix) 08/17/2019    TDAP status: Due, Education has been provided regarding the importance of this vaccine. Advised may receive this vaccine at local pharmacy or Health Dept. Aware to provide a copy of the vaccination record if obtained from local pharmacy or Health Dept. Verbalized acceptance and understanding.  Flu Vaccine status: Declined, Education has been provided regarding the importance of this vaccine but patient still declined. Advised may receive this vaccine at local pharmacy or Health Dept. Aware to provide a copy of the vaccination record if obtained from local pharmacy or Health Dept. Verbalized acceptance and understanding.  Pneumococcal vaccine status: Declined,  Education has been provided regarding the importance of this vaccine but patient still declined. Advised may receive this vaccine at local pharmacy or Health Dept. Aware to provide a copy of the vaccination record if obtained from local pharmacy or Health Dept. Verbalized acceptance and understanding.   Covid-19 vaccine status: Declined, Education has been  provided regarding the importance of this vaccine but patient still declined. Advised may receive this vaccine at local pharmacy or Health Dept.or vaccine clinic. Aware to provide a copy of the vaccination record if obtained from local pharmacy or Health Dept. Verbalized acceptance and understanding.  Qualifies for Shingles Vaccine? Yes   Zostavax completed No   Shingrix Completed?: Yes  Screening Tests Health Maintenance  Topic Date Due   Hepatitis C Screening  Never done   DTaP/Tdap/Td (1 - Tdap) Never done   Pneumonia Vaccine 27+ Years old (1 of 2 - PCV) Never done   Zoster Vaccines- Shingrix (2 of 2) 10/12/2019   COVID-19 Vaccine (6 - 2024-25 season) 11/06/2022   Fecal DNA (Cologuard)  06/02/2023   INFLUENZA VACCINE  10/06/2023   Medicare Annual Wellness (AWV)  07/19/2024   HPV VACCINES  Aged Out   Meningococcal B Vaccine  Aged Out   Colonoscopy  Discontinued    Health Maintenance  Health Maintenance Due  Topic Date Due   Hepatitis C Screening  Never done   DTaP/Tdap/Td (1 - Tdap) Never done   Pneumonia Vaccine 65+  Years old (1 of 2 - PCV) Never done   Zoster Vaccines- Shingrix (2 of 2) 10/12/2019   COVID-19 Vaccine (6 - 2024-25 season) 11/06/2022   Fecal DNA (Cologuard)  06/02/2023    Colorectal cancer screening: Type of screening: Sigmoidoscopy. Completed 04/27/2023. Repeat every 2 years  Lung Cancer Screening: (Low Dose CT Chest recommended if Age 66-80 years, 20 pack-year currently smoking OR have quit w/in 15years.) does not qualify.   Lung Cancer Screening Referral: N/a  Additional Screening:  Hepatitis C Screening: does qualify; Completed not yet  Vision Screening: Recommended annual ophthalmology exams for early detection of glaucoma and other disorders of the eye. Is the patient up to date with their annual eye exam?  Yes  Who is the provider or what is the name of the office in which the patient attends annual eye exams? [facility] If pt is not  established with a provider, would they like to be referred to a provider to establish care? N/a.   Dental Screening: Recommended annual dental exams for proper oral hygiene   Community Resource Referral / Chronic Care Management: CRR required this visit?  No   CCM required this visit?  No     Plan:     I have personally reviewed and noted the following in the patient's chart:   Medical and social history Use of alcohol, tobacco or illicit drugs  Current medications and supplements including opioid prescriptions. Patient is not currently taking opioid prescriptions. Functional ability and status Nutritional status Physical activity Advanced directives List of other physicians Hospitalizations #1, surgeries #1, and ER # 2 visits in previous 12 months Vitals Screenings to include cognitive, depression, and falls Referrals and appointments  In addition, I have reviewed and discussed with patient certain preventive protocols, quality metrics, and best practice recommendations. A written personalized care plan for preventive services as well as general preventive health recommendations were provided to patient.     Aubrey Leaf, CMA   07/20/2023   After Visit Summary: (MyChart) Due to this being a telephonic visit, the after visit summary with patients personalized plan was offered to patient via MyChart   Nurse Notes:   Rhyse Hougen is a 73 y.o. male patient of Adela Holter, DO who had a Medicare Annual Wellness Visit today via telephone. Locke is Working full time and lives with his wife. He has 2 children. He reports that he is socially active and does interact with friends/family regularly. He is minimally physically active and enjoys working and photography.

## 2023-07-27 ENCOUNTER — Telehealth: Payer: Self-pay

## 2023-07-27 ENCOUNTER — Encounter: Admitting: Family Medicine

## 2023-07-27 NOTE — Telephone Encounter (Signed)
 [name] was scheduled for an Annual Physical appointment. Front Office staff advised that "sick visit questions" could incur a fee.   CMA Monticello Hammans brought the patient back to the room and advised of Physical appt. Asked if there were any concerns. [name] stated, "I have a list of things I'm going to talk to him about." He was advised that the Physical was a "well visit" and if he had a list of concerns his current appointment could be changed without a problem. [name] did not want to change his appointment after the brief explanation. Then he stormed out of the office.

## 2023-08-01 ENCOUNTER — Encounter: Payer: Self-pay | Admitting: Family Medicine

## 2023-08-10 ENCOUNTER — Ambulatory Visit (INDEPENDENT_AMBULATORY_CARE_PROVIDER_SITE_OTHER): Admitting: Neurology

## 2023-08-10 ENCOUNTER — Encounter: Payer: Self-pay | Admitting: Neurology

## 2023-08-10 VITALS — BP 106/67 | HR 49 | Resp 16 | Ht 74.0 in | Wt 211.4 lb

## 2023-08-10 DIAGNOSIS — R4189 Other symptoms and signs involving cognitive functions and awareness: Secondary | ICD-10-CM

## 2023-08-10 DIAGNOSIS — R200 Anesthesia of skin: Secondary | ICD-10-CM | POA: Diagnosis not present

## 2023-08-10 NOTE — Progress Notes (Addendum)
 Chief Complaint  Patient presents with   New Patient (Initial Visit)    Rm14, alone, internal referral for hx of CVA, Memory deficit: moca score was 29/30. Pt stated that he misplaces things constantly, enters rooms wondering why he went in that room, driving and forgets where he is at.       ASSESSMENT AND PLAN  William Wheeler is a 73 y.o. male   Mild cognitive impairment  MoCA examination 29/30  MRI of the brain in 2022 showed no acute abnormality  Laboratory evaluation to rule out treatable etiology, discussed with patient, he would like to complete evaluation including Alzheimer's related profile, also proceed with brain amyloid PET scan, he is a potential candidate for amyloid removing agent treatment  Follow-up in 6 months  DIAGNOSTIC DATA (LABS, IMAGING, TESTING) - I reviewed patient records, labs, notes, testing and imaging myself where available.   MEDICAL HISTORY:  William Wheeler is a 73 year old male, seen in request by his primary care physician Dr. Augustus Ledger, Aletha Hutching, for evaluation of memory loss   History is obtained from the patient and review of electronic medical records. I personally reviewed pertinent available imaging films in PACS.   PMHx of  HLD HTN Depression anxiety,  Hx of alcohol, quit in 2019. 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, that was acute in April 2021, he had MRI at that time for constellation of complaints, including muscle cramping, this happened in the setting of long history of heavy alcohol, smoke, that he quit alcohol around 2019, and Creech 2 pack daily smoking around that time too  He has frequent colitis, eventually underwent sigmoidectomy for acute sigmoid diverticulitis, rapid  30 pounds weight loss, now recovered, feeling better  He continues to work full-time, 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  He also complains of intermittent episode of body feeling cold, foot numbness,  PHYSICAL EXAM:   Vitals:   08/10/23 0829  BP: 106/67  Pulse: (!) 49  Resp: 16  SpO2: 97%  Weight: 211 lb 6.4 oz (95.9 kg)  Height: 6' 2 (1.88 m)   Body mass index is 27.14 kg/m.  PHYSICAL EXAMNIATION:  Gen: NAD, conversant, well nourised, well groomed                     Cardiovascular: Regular rate rhythm, no peripheral edema, warm, nontender. Eyes: Conjunctivae clear without exudates or hemorrhage Neck: Supple, no carotid bruits. Pulmonary: Clear to auscultation bilaterally   NEUROLOGICAL EXAM:  MENTAL STATUS: Speech/cognition: Awake, alert, oriented to history taking and casual conversation    08/10/2023    8:32 AM  Montreal Cognitive Assessment   Visuospatial/ Executive (0/5) 5  Naming (0/3) 3  Attention: Read list of digits (0/2) 1  Attention: Read list of letters (0/1) 1  Attention: Serial 7 subtraction starting at 100 (0/3) 3  Language: Repeat phrase (0/2) 2  Language : Fluency (0/1) 1  Abstraction (0/2) 2  Delayed Recall (0/5) 5  Orientation (0/6) 6  Total 29  Adjusted Score (based on education) 29    CRANIAL NERVES: CN II: Visual fields are full to confrontation. Pupils are round equal and briskly reactive to light. CN Wheeler, IV, VI: extraocular movement are normal. No ptosis. CN V: Facial sensation is intact to light touch CN VII: Face is symmetric with normal eye closure  CN VIII: Hearing is normal to causal conversation. CN IX, X: Phonation is  normal. CN XI: Head turning and shoulder shrug are intact  MOTOR: There is no pronator drift of out-stretched arms. Muscle bulk and tone are normal. Muscle strength is normal.  REFLEXES: Reflexes are 2+ and symmetric at the biceps, triceps, knees, and trace ankles. Plantar responses are flexor.  SENSORY: Mildly decreased vibratory sensation at toes, length-dependent decreased light touch pinprick to distal shin level  COORDINATION: There is no trunk or limb dysmetria noted.  GAIT/STANCE: Posture is normal. Gait is steady.  REVIEW OF SYSTEMS:  Full 14 system review of systems performed and notable only for as above All other review of systems were negative.   ALLERGIES: No Known Allergies  HOME MEDICATIONS: Current Outpatient Medications  Medication Sig Dispense Refill   acetaminophen  (TYLENOL ) 325 MG tablet Take 650 mg by mouth every 6 (six) hours as needed for moderate pain (pain score 4-6).     ammonium lactate  (AMLACTIN) 12 % cream APPLY 1 APPLICATION TOPICALLY AS NEEDED FOR DRY SKIN. 385 g 0   atorvastatin  (LIPITOR) 40 MG tablet TAKE 1 TABLET EVERY DAY 90 tablet 3   clopidogrel  (PLAVIX ) 75 MG tablet TAKE 1 TABLET EVERY DAY 90 tablet 3   Cyanocobalamin  (B-12 PO) Take 1 tablet by mouth daily.     escitalopram  (LEXAPRO ) 10 MG tablet TAKE 1 TABLET EVERY DAY 90 tablet 3   losartan -hydrochlorothiazide  (HYZAAR) 50-12.5 MG tablet TAKE 1 TABLET EVERY DAY 90 tablet 3   Omega-3 Fatty Acids (FISH OIL) 1000 MG CAPS Take 1,000 mg by mouth daily.     No current facility-administered medications for this visit.    PAST MEDICAL HISTORY: Past Medical History:  Diagnosis Date   Alcohol abuse    Alcohol addiction (HCC)    Arthritis    Colon polyps    Depression    Diverticulitis    Diverticulitis    History of colon polyps    History of hiatal hernia    Hypercholesteremia    Hypertension    Infectious colitis    Skin cancer    Stroke (HCC)     PAST SURGICAL HISTORY: Past  Surgical History:  Procedure Laterality Date   broken bone repair     Nose   COLONOSCOPY     [facility] per patient about 15 years ago   FLEXIBLE SIGMOIDOSCOPY N/A 04/27/2023   Procedure: FLEXIBLE SIGMOIDOSCOPY;  Surgeon: Melvenia Stabs, MD;  Location: [facility];  Service: General;  Laterality: N/A;   HERNIA REPAIR     skin cancer removed  10/2021   forehead   TONSILLECTOMY     XI ROBOTIC ASSISTED LOWER ANTERIOR RESECTION N/A 04/27/2023   Procedure: XI ROBOTIC ASSISTED LOWER ANTERIOR RESECTION WITH COLORECTAL ANASTOMOSIS;  Surgeon: Melvenia Stabs, MD;  Location: [facility];  Service: General;  Laterality: N/A;    FAMILY HISTORY: Family History  Problem Relation Age of Onset   Leukemia Mother    Non-Hodgkin's lymphoma Mother    Other Father        unsure of medical history   Breast cancer Sister  Colon cancer Neg Hx    Esophageal cancer Neg Hx     SOCIAL HISTORY: Social History   Socioeconomic History   Marital status: Married    Spouse name: Kerrie Latour   Number of children: 2   Years of education: some college   Highest education level: Associate degree: occupational, Scientist, product/process development, or vocational program  Occupational History   Occupation: Scientist, research (physical sciences)  Tobacco Use   Smoking status: Former    Current packs/day: 0.00    Average packs/day: 2.5 packs/day for 29.0 years (72.5 ttl pk-yrs)    Types: Cigarettes    Start date: 94    Quit date: 2001    Years since quitting: 24.4   Smokeless tobacco: Never  Vaping Use   Vaping status: Never Used  Substance and Sexual Activity   Alcohol use: Not Currently   Drug use: Not Currently    Types: Marijuana    Comment: occasionally   Sexual activity: Not Currently    Partners: Female  Other Topics Concern   Not on file  Social History Narrative   Lives at home with his wife. 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    Social Drivers of Corporate investment banker Strain: Low Risk  (07/20/2023)   Overall Financial  Resource Strain (CARDIA)    Difficulty of Paying Living Expenses: Not hard at all  Food Insecurity: No Food Insecurity (07/20/2023)   Hunger Vital Sign    Worried About Running Out of Food in the Last Year: Never true    Ran Out of Food in the Last Year: Never true  Transportation Needs: No Transportation Needs (07/20/2023)   PRAPARE - Administrator, Civil Service (Medical): No    Lack of Transportation (Non-Medical): No  Physical Activity: Insufficiently Active (07/20/2023)   Exercise Vital Sign    Days of Exercise per Week: 1 day    Minutes of Exercise per Session: 30 min  Stress: No Stress Concern Present (07/20/2023)   [employer] of Occupational Health - Occupational Stress Questionnaire    Feeling of Stress : Only a little  Recent Concern: Stress - Stress Concern Present (07/19/2023)   [employer] of Occupational Health - Occupational Stress Questionnaire    Feeling of Stress : To some extent  Social Connections: Moderately Integrated (07/20/2023)   Social Connection and Isolation Panel [NHANES]    Frequency of Communication with Friends and Family: More than three times a week    Frequency of Social Gatherings with Friends and Family: Never    Attends Religious Services: More than 4 times per year    Active Member of [employer] or Organizations: No    Attends Banker Meetings: Never    Marital Status: Married  Recent Concern: Social Connections - Moderately Isolated (04/27/2023)   Social Connection and Isolation Panel [NHANES]    Frequency of Communication with Friends and Family: Once a week    Frequency of Social Gatherings with Friends and Family: Never    Attends Religious Services: More than 4 times per year    Active Member of [employer] or Organizations: No    Attends Banker Meetings: Never    Marital Status: Married  Catering manager Violence: Not At Risk (07/20/2023)   Humiliation, Afraid, Rape, and Kick questionnaire    Fear of  Current or Ex-Partner: No    Emotionally Abused: No    Physically Abused: No    Sexually Abused: No      Phebe Brasil,  M.D. Ph.D.  [facility] [address] [place], [place] [address] Ph: [phone] Fax: [phone]  CC:  Adela Holter, DO [address] [facility] [address] [place],  [place] [address]  Adela Holter, DO

## 2023-08-22 LAB — CBC WITH DIFFERENTIAL/PLATELET
Basophils Absolute: 0.1 10*3/uL (ref 0.0–0.2)
Basos: 1 %
EOS (ABSOLUTE): 0.1 10*3/uL (ref 0.0–0.4)
Eos: 1 %
Hematocrit: 43.1 % (ref 37.5–51.0)
Hemoglobin: 14 g/dL (ref 13.0–17.7)
Immature Grans (Abs): 0 10*3/uL (ref 0.0–0.1)
Immature Granulocytes: 0 %
Lymphocytes Absolute: 2 10*3/uL (ref 0.7–3.1)
Lymphs: 26 %
MCH: 29.9 pg (ref 26.6–33.0)
MCHC: 32.5 g/dL (ref 31.5–35.7)
MCV: 92 fL (ref 79–97)
Monocytes Absolute: 0.6 10*3/uL (ref 0.1–0.9)
Monocytes: 7 %
Neutrophils Absolute: 4.9 10*3/uL (ref 1.4–7.0)
Neutrophils: 65 %
Platelets: 243 10*3/uL (ref 150–450)
RBC: 4.68 x10E6/uL (ref 4.14–5.80)
RDW: 13.5 % (ref 11.6–15.4)
WBC: 7.7 10*3/uL (ref 3.4–10.8)

## 2023-08-22 LAB — MULTIPLE MYELOMA PANEL, SERUM
Albumin SerPl Elph-Mcnc: 4.1 g/dL (ref 2.9–4.4)
Albumin/Glob SerPl: 1.4 (ref 0.7–1.7)
Alpha 1: 0.2 g/dL (ref 0.0–0.4)
Alpha2 Glob SerPl Elph-Mcnc: 0.7 g/dL (ref 0.4–1.0)
B-Globulin SerPl Elph-Mcnc: 1.1 g/dL (ref 0.7–1.3)
Gamma Glob SerPl Elph-Mcnc: 1.1 g/dL (ref 0.4–1.8)
Globulin, Total: 3.1 g/dL (ref 2.2–3.9)
IgA/Immunoglobulin A, Serum: 210 mg/dL (ref 61–437)
IgG (Immunoglobin G), Serum: 1222 mg/dL (ref 603–1613)
IgM (Immunoglobulin M), Srm: 64 mg/dL (ref 20–172)

## 2023-08-22 LAB — ATN PROFILE
A -- Beta-amyloid 42/40 Ratio: 0.128 (ref >0.102)
Beta-amyloid 40: 111.19 pg/mL
Beta-amyloid 42: 14.18 pg/mL
N -- NfL, Plasma: 1.88 pg/mL (ref 0.00–6.04)
T -- p-tau181: 0.88 pg/mL (ref 0.00–0.97)

## 2023-08-22 LAB — RPR: RPR Ser Ql: NONREACTIVE

## 2023-08-22 LAB — COMPREHENSIVE METABOLIC PANEL WITH GFR
ALT: 14 IU/L (ref 0–44)
AST: 20 IU/L (ref 0–40)
Albumin: 4.4 g/dL (ref 3.9–4.9)
Alkaline Phosphatase: 46 IU/L (ref 44–121)
BUN/Creatinine Ratio: 17 (ref 10–24)
BUN: 20 mg/dL (ref 8–27)
Bilirubin Total: 0.5 mg/dL (ref 0.0–1.2)
CO2: 24 mmol/L (ref 20–29)
Calcium: 10.1 mg/dL (ref 8.6–10.2)
Chloride: 102 mmol/L (ref 96–106)
Creatinine, Ser: 1.16 mg/dL (ref 0.76–1.27)
Globulin, Total: 2.8 g/dL (ref 1.5–4.5)
Glucose: 96 mg/dL (ref 70–99)
Potassium: 4.7 mmol/L (ref 3.5–5.2)
Sodium: 138 mmol/L (ref 134–144)
Total Protein: 7.2 g/dL (ref 6.0–8.5)
eGFR: 68 mL/min/{1.73_m2} (ref >59)

## 2023-08-22 LAB — APOE ALZHEIMER'S RISK

## 2023-08-22 LAB — SEDIMENTATION RATE: Sed Rate: 2 mm/h (ref 0–30)

## 2023-08-22 LAB — TSH: TSH: 1.32 u[IU]/mL (ref 0.450–4.500)

## 2023-08-22 LAB — ANA W/REFLEX IF POSITIVE: Anti Nuclear Antibody (ANA): NEGATIVE

## 2023-08-22 LAB — C-REACTIVE PROTEIN: CRP: <1 mg/L (ref 0–10)

## 2023-08-22 LAB — VITAMIN B12: Vitamin B-12: 531 pg/mL (ref 232–1245)

## 2023-08-22 LAB — VITAMIN D 25 HYDROXY (VIT D DEFICIENCY, FRACTURES): Vit D, 25-Hydroxy: 31.7 ng/mL (ref 30.0–100.0)

## 2023-08-22 LAB — HGB A1C W/O EAG: Hgb A1c MFr Bld: 5.6 % (ref 4.8–5.6)

## 2023-08-23 ENCOUNTER — Telehealth: Payer: Self-pay | Admitting: Neurology

## 2023-08-23 NOTE — Telephone Encounter (Signed)
 His insurance is reviewing our notes for the pet scan authorization and is asking, If the patient is a candidate of anti amyloid therapy/treatment. Can you add something about this to the office note and I can resend them?

## 2023-08-23 NOTE — Telephone Encounter (Signed)
 Updated note sent to [facility].

## 2023-08-24 ENCOUNTER — Ambulatory Visit: Payer: Self-pay | Admitting: Neurology

## 2023-08-28 NOTE — Telephone Encounter (Signed)
 Mylene shara: [phone] exp. 08/14/23-10/13/23 sent to [facility] [phone]

## 2023-08-31 DIAGNOSIS — H43812 Vitreous degeneration, left eye: Secondary | ICD-10-CM | POA: Diagnosis not present

## 2023-08-31 DIAGNOSIS — H524 Presbyopia: Secondary | ICD-10-CM | POA: Diagnosis not present

## 2023-08-31 DIAGNOSIS — H2513 Age-related nuclear cataract, bilateral: Secondary | ICD-10-CM | POA: Diagnosis not present

## 2023-08-31 DIAGNOSIS — H04123 Dry eye syndrome of bilateral lacrimal glands: Secondary | ICD-10-CM | POA: Diagnosis not present

## 2023-09-14 ENCOUNTER — Encounter (HOSPITAL_COMMUNITY)
Admission: RE | Admit: 2023-09-14 | Discharge: 2023-09-14 | Disposition: A | Discharge status: Home / Self Care | Source: Ambulatory Visit | Attending: Neurology | Admitting: Neurology

## 2023-09-14 DIAGNOSIS — R4189 Other symptoms and signs involving cognitive functions and awareness: Secondary | ICD-10-CM | POA: Diagnosis not present

## 2023-09-14 DIAGNOSIS — R200 Anesthesia of skin: Secondary | ICD-10-CM | POA: Diagnosis not present

## 2023-09-14 MED ORDER — FLORBETAPIR F 18 500-1900 MBQ/ML IV SOLN
10.8310 | Freq: Once | INTRAVENOUS | Status: AC
Start: 2023-09-14 — End: 2023-09-14
  Administered 2023-09-14: 10.831 via INTRAVENOUS

## 2023-09-20 NOTE — Progress Notes (Signed)
 Amyloid pet scan is not consistent with Alzheimer disease

## 2023-10-19 ENCOUNTER — Encounter (HOSPITAL_BASED_OUTPATIENT_CLINIC_OR_DEPARTMENT_OTHER): Payer: Self-pay | Admitting: Emergency Medicine

## 2023-10-19 ENCOUNTER — Ambulatory Visit: Payer: Self-pay

## 2023-10-19 ENCOUNTER — Other Ambulatory Visit: Payer: Self-pay

## 2023-10-19 ENCOUNTER — Emergency Department (HOSPITAL_BASED_OUTPATIENT_CLINIC_OR_DEPARTMENT_OTHER): Admission: EM | Admit: 2023-10-19 | Discharge: 2023-10-19 | Disposition: A | Discharge status: Home / Self Care

## 2023-10-19 ENCOUNTER — Emergency Department (HOSPITAL_BASED_OUTPATIENT_CLINIC_OR_DEPARTMENT_OTHER)

## 2023-10-19 ENCOUNTER — Other Ambulatory Visit (HOSPITAL_BASED_OUTPATIENT_CLINIC_OR_DEPARTMENT_OTHER): Payer: Self-pay

## 2023-10-19 DIAGNOSIS — I4892 Unspecified atrial flutter: Secondary | ICD-10-CM | POA: Diagnosis not present

## 2023-10-19 DIAGNOSIS — R002 Palpitations: Secondary | ICD-10-CM | POA: Diagnosis not present

## 2023-10-19 DIAGNOSIS — I10 Essential (primary) hypertension: Secondary | ICD-10-CM | POA: Diagnosis not present

## 2023-10-19 DIAGNOSIS — Z79899 Other long term (current) drug therapy: Secondary | ICD-10-CM | POA: Insufficient documentation

## 2023-10-19 DIAGNOSIS — Z7901 Long term (current) use of anticoagulants: Secondary | ICD-10-CM | POA: Diagnosis not present

## 2023-10-19 DIAGNOSIS — R0602 Shortness of breath: Secondary | ICD-10-CM | POA: Diagnosis not present

## 2023-10-19 LAB — CBC WITH DIFFERENTIAL/PLATELET
Abs Immature Granulocytes: 0.03 K/uL (ref 0.00–0.07)
Basophils Absolute: 0.1 K/uL (ref 0.0–0.1)
Basophils Relative: 1 %
Eosinophils Absolute: 0.1 K/uL (ref 0.0–0.5)
Eosinophils Relative: 1 %
HCT: 41.1 % (ref 39.0–52.0)
Hemoglobin: 14 g/dL (ref 13.0–17.0)
Immature Granulocytes: 0 %
Lymphocytes Relative: 21 %
Lymphs Abs: 2.1 K/uL (ref 0.7–4.0)
MCH: 29 pg (ref 26.0–34.0)
MCHC: 34.1 g/dL (ref 30.0–36.0)
MCV: 85.1 fL (ref 80.0–100.0)
Monocytes Absolute: 0.9 K/uL (ref 0.1–1.0)
Monocytes Relative: 9 %
Neutro Abs: 7 K/uL (ref 1.7–7.7)
Neutrophils Relative %: 68 %
Platelets: 286 K/uL (ref 150–400)
RBC: 4.83 MIL/uL (ref 4.22–5.81)
RDW: 13.6 % (ref 11.5–15.5)
WBC: 10.2 K/uL (ref 4.0–10.5)
nRBC: 0 % (ref 0.0–0.2)

## 2023-10-19 LAB — BASIC METABOLIC PANEL WITH GFR
Anion gap: 15 (ref 5–15)
BUN: 27 mg/dL — ABNORMAL HIGH (ref 8–23)
CO2: 19 mmol/L — ABNORMAL LOW (ref 22–32)
Calcium: 9.8 mg/dL (ref 8.9–10.3)
Chloride: 105 mmol/L (ref 98–111)
Creatinine, Ser: 1.3 mg/dL — ABNORMAL HIGH (ref 0.61–1.24)
GFR, Estimated: 59 mL/min — ABNORMAL LOW (ref >60)
Glucose, Bld: 96 mg/dL (ref 70–99)
Potassium: 3.8 mmol/L (ref 3.5–5.1)
Sodium: 138 mmol/L (ref 135–145)

## 2023-10-19 LAB — TROPONIN T, HIGH SENSITIVITY
Troponin T High Sensitivity: <15 ng/L (ref 0–19)
Troponin T High Sensitivity: <15 ng/L (ref 0–19)

## 2023-10-19 MED ORDER — APIXABAN 5 MG PO TABS
5.0000 mg | ORAL_TABLET | Freq: Two times a day (BID) | ORAL | 0 refills | Status: DC
  Filled 2023-10-19 (×2): qty 60, 30d supply, fill #0

## 2023-10-19 MED ORDER — APIXABAN 5 MG PO TABS: 5.0000 mg | ORAL_TABLET | Freq: Two times a day (BID) | ORAL | 0 refills | Status: DC

## 2023-10-19 MED ORDER — APIXABAN 2.5 MG PO TABS
5.0000 mg | ORAL_TABLET | Freq: Two times a day (BID) | ORAL | Status: DC
  Administered 2023-10-19: 5 mg via ORAL
  Filled 2023-10-19: qty 2

## 2023-10-19 MED ORDER — METOPROLOL TARTRATE 25 MG PO TABS
25.0000 mg | ORAL_TABLET | Freq: Once | ORAL | Status: AC
  Administered 2023-10-19: 25 mg via ORAL
  Filled 2023-10-19: qty 1

## 2023-10-19 MED ORDER — METOPROLOL SUCCINATE ER 25 MG PO TB24: 25.0000 mg | ORAL_TABLET | Freq: Every day | ORAL | 0 refills | Status: DC

## 2023-10-19 MED ORDER — METOPROLOL SUCCINATE ER 25 MG PO TB24
25.0000 mg | ORAL_TABLET | Freq: Every day | ORAL | 0 refills | Status: DC
  Filled 2023-10-19 (×2): qty 30, 30d supply, fill #0

## 2023-10-19 NOTE — ED Notes (Signed)

## 2023-10-19 NOTE — ED Notes (Signed)
 Pt endorsed sudden onset of SOB via palpitations but they've been getting better. Tried to check his own pulse to see if it was fast or slow and wasn't able to do so. Uses his watch for HR monitoring and is usually in the 60s.

## 2023-10-19 NOTE — ED Notes (Addendum)
 EKG reading PI:[id number] showed lead reversal which is incorrect. Verified by Delon Erm, RN. MD notified.

## 2023-10-19 NOTE — Telephone Encounter (Signed)
 Attempted to call patient back but he is currently in the ED. Routing to clinic.

## 2023-10-19 NOTE — ED Provider Notes (Signed)
 [facility] Provider Note   CSN: [id number] Arrival date & time: 10/19/23  1339     Patient presents with: Palpitations   William Wheeler is a 73 y.o. male.    Palpitations Associated symptoms: shortness of breath   Patient is a 73 year old male was in the ED today with concerns for intermittent palpitations and shortness of breath which has been getting for the last 4 days.  Stating that William Wheeler has looked at his watch and noticed it reporting atrial fibrillation during these episodes.  No previous medical history of any heart disease or arrhythmias.  Does have a previous medical history of HTN, stroke anticoagulated on Plavix , alcohol abuse in remission, diverticulitis, HLD, arthritis.  Reported to have no missed dosing of Plavix  for the last 3 weeks.  Reports that the episodes are intermittent lasting approximately 5 to 10 minutes before stopping.  With last episode happening today.  Shortness of breath is worse with exertion.  Without any chest pain.  Denies fever, vision changes, unilateral weakness, chest pain, abdominal pain, nausea, vomiting, diarrhea, dysuria, hematuria, lower leg swelling.     Prior to Admission medications   Medication Sig Start Date End Date Taking? Authorizing Provider  acetaminophen  (TYLENOL ) 325 MG tablet Take 650 mg by mouth every 6 (six) hours as needed for moderate pain (pain score 4-6).    [provider]  ammonium lactate  (AMLACTIN) 12 % cream APPLY 1 APPLICATION TOPICALLY AS NEEDED FOR DRY SKIN. 12/16/22   McCaughan, Dia D, DPM  apixaban  (ELIQUIS ) 5 MG TABS tablet Take 1 tablet (5 mg total) by mouth 2 (two) times daily. 10/19/23   Kaden Dunkel S, PA-C  atorvastatin  (LIPITOR) 40 MG tablet TAKE 1 TABLET EVERY DAY 06/26/23   Alvia Bring, DO  Cyanocobalamin  (B-12 PO) Take 1 tablet by mouth daily.    [provider]  escitalopram  (LEXAPRO ) 10 MG tablet TAKE 1 TABLET EVERY DAY 06/20/23    Alvia Bring, DO  losartan -hydrochlorothiazide  (HYZAAR) 50-12.5 MG tablet TAKE 1 TABLET EVERY DAY 03/02/23   Alvia Bring, DO  metoprolol  succinate (TOPROL -XL) 25 MG 24 hr tablet Take 1 tablet (25 mg total) by mouth daily. 10/19/23   Luan Urbani S, PA-C  Omega-3 Fatty Acids (FISH OIL) 1000 MG CAPS Take 1,000 mg by mouth daily.    [provider]    Allergies: Patient has no known allergies.    Review of Systems  Respiratory:  Positive for shortness of breath.   Cardiovascular:  Positive for palpitations.  All other systems reviewed and are negative.   Updated Vital Signs BP 128/78   Pulse 75   Temp 97.9 F (36.6 C)   Resp 19   Ht 6' 2 (1.88 m)   Wt 93.9 kg   SpO2 100%   BMI 26.58 kg/m   Physical Exam Vitals and nursing note reviewed.  Constitutional:      General: William Wheeler is not in acute distress.    Appearance: Normal appearance. William Wheeler is not ill-appearing or diaphoretic.  HENT:     Head: Normocephalic and atraumatic.  Eyes:     General: No scleral icterus.       Right eye: No discharge.        Left eye: No discharge.     Extraocular Movements: Extraocular movements intact.     Conjunctiva/sclera: Conjunctivae normal.  Cardiovascular:     Rate and Rhythm: Normal rate. Rhythm irregular.     Pulses: Normal pulses.  Heart sounds: Normal heart sounds. No murmur heard.    No friction rub. No gallop.  Pulmonary:     Effort: Pulmonary effort is normal. No respiratory distress.     Breath sounds: No stridor. No wheezing, rhonchi or rales.  Chest:     Chest wall: No tenderness.  Abdominal:     General: Abdomen is flat. There is no distension.     Palpations: Abdomen is soft.     Tenderness: There is no abdominal tenderness. There is no right CVA tenderness, left CVA tenderness, guarding or rebound.  Musculoskeletal:        General: No swelling, deformity or signs of injury.     Cervical back: Normal range of motion. No rigidity or tenderness.     Right lower  leg: No edema.     Left lower leg: No edema.  Skin:    General: Skin is warm and dry.     Findings: No bruising, erythema or lesion.  Neurological:     General: No focal deficit present.     Mental Status: William Wheeler is alert and oriented to person, place, and time. Mental status is at baseline.     Sensory: No sensory deficit.     Motor: No weakness.  Psychiatric:        Mood and Affect: Mood normal.     (all labs ordered are listed, but only abnormal results are displayed) Labs Reviewed  BASIC METABOLIC PANEL WITH GFR - Abnormal; Notable for the following components:      Result Value   CO2 19 (*)    BUN 27 (*)    Creatinine, Ser 1.30 (*)    GFR, Estimated 59 (*)    All other components within normal limits  CBC WITH DIFFERENTIAL/PLATELET  TROPONIN T, HIGH SENSITIVITY  TROPONIN T, HIGH SENSITIVITY    EKG: EKG Interpretation Date/Time:  Thursday October 19 2023 14:35:16 EDT Ventricular Rate:  80 PR Interval:  123 QRS Duration:  106 QT Interval:  378 QTC Calculation: 436 R Axis:   68  Text Interpretation: Atrial flutter with predominant 3:1 AV block Confirmed by Neysa Clap [phone]) on 10/19/2023 3:16:11 PM  Radiology: [facility] Chest 2 View Result Date: 10/19/2023 CLINICAL DATA:  Shortness of breath and palpitations EXAM: CHEST - 2 VIEW COMPARISON:  Chest x-ray 11/20/2022 FINDINGS: The heart size and mediastinal contours are within normal limits. Both lungs are clear. The visualized skeletal structures are unremarkable. IMPRESSION: No active cardiopulmonary disease. Electronically Signed   By: Greig Pique M.D.   On: 10/19/2023 16:29    Procedures   Medications Ordered in the ED  apixaban  (ELIQUIS ) tablet 5 mg (5 mg Oral Given 10/19/23 1725)  metoprolol  tartrate (LOPRESSOR ) tablet 25 mg (25 mg Oral Given 10/19/23 1719)    Clinical Course as of 10/19/23 1727  Thu Oct 19, 2023  1656 Spoke with Dr. Shlomo with cardiology who wished for the patient to be switched to eliquis  as well  as started on 25mg  of metoprolol . Reported that they will arrange follow up and their clinic will reach out to this patient to schedule an appointment.  [CB]  1657 Spoke with Dr. Matthews with Neurology who recommended that the patient stop plavix  and start eliquis  same day.  [CB]    Clinical Course User Index [CB] Beola Terrall RAMAN, PA-C   Medical Decision Making  This patient is a 73 year old male who presents to the ED for concern of arrhythmia accompanied with shortness of breath.   No  chest pain.  On physical exam, patient is in no acute distress, afebrile, alert and orient x 4, speaking in full sentences, nontachypneic, nontachycardic.  Notably does look to be in atrial flutter without any rapid ventricular rate.  LCTAB, irregular rhythm.  No lower leg edema.  Unremarkable exam otherwise.  Patient has converted himself multiple times with Valsalva maneuver.  However is still in atrial flutter, however rate is still controlled.  Labs were drawn and showed some mild dehydration on his BMP, but had noted that William Wheeler has not been drinking much today.  Oral fluids were provided.  Otherwise troponins were negative and lab work was unremarkable.  Spoke with cardiology, Dr. Shlomo with new onset atrial flutter with him having a [place] vas 2 score of 4 and they requested for him to be switched over to Eliquis  from Plavix  as well as having him started on metoprolol  25 mg and that their clinic will reach out to him to schedule an appointment with no need for referral placed from the emergency department today.  Spoke with neurology, Dr. Matthews who said the blood thinner switch was fine.   Patient was provided first dose of Eliquis  as well as metoprolol  here in the emergency department.  Will have him continue to follow-up with cardiology further monitoring and given strict return to ER precautions.  Patient vital signs have remained stable throughout the course of patient's time in the ED. Low suspicion for any  other emergent pathology at this time. I believe this patient is safe to be discharged. Provided strict return to ER precautions. Patient expressed agreement and understanding of plan. All questions were answered.   Differential diagnoses prior to evaluation: The emergent differential diagnosis includes, but is not limited to, CHF, pericardial effusion/tamponade, arrhythmias, ACS, COPD, asthma, bronchitis, pneumonia, pneumothorax, PE, anemia . This is not an exhaustive differential.   Past Medical History / Co-morbidities / Social History: HTN, stroke, alcohol abuse in remission, diverticulitis, HLD, arthritis   Additional history: Chart reviewed. Pertinent results include:   Last echo was in 2021 which showed an EF of 55 to 60%  Consultations: Spoke with cardiology, Dr. Shlomo for atrial flutter Spoke with neurology, Dr. Matthews for anticoagulation concerns  Lab Tests/Imaging studies: I personally interpreted labs/imaging and the pertinent results include:    CBC unremarkable BMP shows likely dehydration with a slightly elevated creatinine and mildly decreased GFR.  Troponin and delta troponin were negative  Chest x-ray unremarkable  I agree with the radiologist interpretation.  Cardiac monitoring: EKG obtained and interpreted by myself and attending physician which shows: Atrial flutter   Medications: I ordered medication including metoprolol , Eliquis .  I have reviewed the patients home medicines and have made adjustments as needed.  Critical Interventions: None  Social Determinants of Health: Has good follow-up with cardiology, being scheduled through Dr. Dorine office  Disposition: After consideration of the diagnostic results and the patients response to treatment, I feel that the patient would benefit from discharge and treatment as above.   emergency department workup does not suggest an emergent condition requiring admission or immediate intervention beyond what has  been performed at this time. The plan is: Follow-up with cardiology, stop Plavix , start Eliquis , start metoprolol , return for any new or worsening symptoms. The patient is safe for discharge and has been instructed to return immediately for worsening symptoms, change in symptoms or any other concerns.   Final diagnoses:  Atrial flutter, paroxysmal [facility])    ED Discharge Orders  Ordered    apixaban  (ELIQUIS ) 5 MG TABS tablet  2 times daily,   Status:  Discontinued        10/19/23 1716    metoprolol  succinate (TOPROL -XL) 25 MG 24 hr tablet  Daily,   Status:  Discontinued        10/19/23 1716    apixaban  (ELIQUIS ) 5 MG TABS tablet  2 times daily        10/19/23 1725    metoprolol  succinate (TOPROL -XL) 25 MG 24 hr tablet  Daily        10/19/23 1725               Shervin Cypert S, PA-C 10/19/23 1727    Francesca Elsie CROME, MD 10/19/23 1752

## 2023-10-19 NOTE — ED Triage Notes (Addendum)
 Pt reports started having ShoB and heart palpitations on Sat, denies CP, no ShoB in triage

## 2023-10-19 NOTE — Telephone Encounter (Signed)
 Pt disconnected prior to transfer to NT.  Copied from CRM [id number]. Topic: Clinical - Red Word Triage >> Oct 19, 2023 10:07 AM Zane F wrote: [facility] that prompted transfer to Nurse Triage:   Bruising easy; stiffness in legs in the morning; shortness of breath 2 or 3 times in the last 6 months from just talking

## 2023-10-19 NOTE — ED Notes (Signed)
 Imported multiple strips and EKG's capturing A fib and NSR.

## 2023-10-19 NOTE — Discharge Instructions (Addendum)
 You were seen today for atrial flutter.  Spoke with cardiology today who wished for you to be started on Eliquis  and to stop Plavix  at this time.  First dose of Eliquis  was provided today.  As well as will have you continue to start metoprolol  which is a rate limiting medication.  Start taking the Eliquis  once in the morning and once in the evening.  Spoke with Dr. Shlomo with cardiology who said that their office will reach out to you to schedule an appointment.  If you begin to have any chest pain or worsening shortness of breath return to the ED for further evaluation.  Or if you have a heart rate that sustained elevation above 100-120 for longer than 20 minutes, return to the ED.

## 2023-10-26 ENCOUNTER — Ambulatory Visit
Admission: EM | Admit: 2023-10-26 | Discharge: 2023-10-26 | Disposition: A | Discharge status: Home / Self Care | Attending: Emergency Medicine | Admitting: Emergency Medicine

## 2023-10-26 ENCOUNTER — Ambulatory Visit

## 2023-10-26 ENCOUNTER — Encounter: Payer: Self-pay | Admitting: Emergency Medicine

## 2023-10-26 DIAGNOSIS — G8929 Other chronic pain: Secondary | ICD-10-CM

## 2023-10-26 DIAGNOSIS — M545 Low back pain, unspecified: Secondary | ICD-10-CM | POA: Diagnosis not present

## 2023-10-26 DIAGNOSIS — M4807 Spinal stenosis, lumbosacral region: Secondary | ICD-10-CM | POA: Diagnosis not present

## 2023-10-26 DIAGNOSIS — M2559 Pain in other specified joint: Secondary | ICD-10-CM | POA: Diagnosis not present

## 2023-10-26 DIAGNOSIS — M5442 Lumbago with sciatica, left side: Secondary | ICD-10-CM | POA: Diagnosis not present

## 2023-10-26 MED ORDER — DEXAMETHASONE SODIUM PHOSPHATE 10 MG/ML IJ SOLN
10.0000 mg | Freq: Once | INTRAMUSCULAR | Status: AC
  Administered 2023-10-26: 10 mg via INTRAMUSCULAR

## 2023-10-26 MED ORDER — TRAMADOL HCL 50 MG PO TABS: 50.0000 mg | ORAL_TABLET | Freq: Four times a day (QID) | ORAL | 0 refills | Status: DC | PRN

## 2023-10-26 MED ORDER — PREDNISONE 10 MG PO TABS: 20.0000 mg | ORAL_TABLET | Freq: Every day | ORAL | 0 refills | Status: AC

## 2023-10-26 NOTE — Discharge Instructions (Addendum)
 Imaging of your SI joint does not show acute bony abnormalities, surgical clips are in place.  Imaging of your lumbar spine showed mild scoliosis and mild disc space narrowing and endplate ridging at L3-4 and L4-5.   Start oral steroids tomorrow.  Take the tramadol  sparingly.  Please follow-up with orthopedics and they can consider moving forward with advanced imaging such as MRI.  Seek immediate care for any new concerning symptoms.

## 2023-10-26 NOTE — ED Triage Notes (Signed)
 Patient c/o low back pain x 10 days, recurrent problem.  Patient recently started a blood thinner and unable to take Ibuprofen .  States that Tylenol  does not work.  Been applying an ice pack, difficulty sleeping due to pain.

## 2023-10-26 NOTE — ED Provider Notes (Signed)
 [facility]    CSN: [id number] Arrival date & time: 10/26/23  1041      History   Chief Complaint Chief Complaint  Patient presents with   Back Pain    HPI William Wheeler is a 73 y.o. male.   Patient presents to clinic over concern of a 10-day flareup of his chronic lower back pain.  Area feels like it wraps around bilaterally, mild radiation down his left leg.  History of chronic back pain and a previous SI injury around 25 years ago.  Has tried Tylenol , ice and heat at home.  Is unable to take NSAIDs due to Eliquis . Has been seen by orthopedics in January 2023 recommended lumbar spine and SI joint x-rays if symptoms did not improve and then potentially MRI based on results.  Symptoms appear to improve so he did not get this imaging. Prednisone  and tramadol  have worked in the past.   Has a sleep number mattress and put this on the lowest setting last night so he sunk into it.  Was able to sleep for 2 hours at a time.  Eventually was tossing and turning so much that he went out to sleep on the couch.  Does have pain with walking and range of motion.  Does not have any inner leg numbness or incontinence.  Denies any recent known trauma or injuries.  The history is provided by the patient and medical records.  Back Pain   Past Medical History:  Diagnosis Date   Alcohol abuse    Alcohol addiction (HCC)    Arthritis    Colon polyps    Depression    Diverticulitis    Diverticulitis    History of colon polyps    History of hiatal hernia    Hypercholesteremia    Hypertension    Infectious colitis    Skin cancer    Stroke [facility])     Patient Active Problem List   Diagnosis Date Noted   S/P laparoscopic-assisted sigmoidectomy 04/27/2023   Acute diverticulitis 11/20/2022   Lesion of urinary bladder 11/20/2022   Hypokalemia 11/20/2022   Irritability 07/07/2022   Frequent urination 07/07/2022   Cognitive impairment 07/07/2022   Cracked skin on feet  02/06/2022   Facial skin lesion 02/06/2022   Oropharyngeal dysphagia 02/06/2022   Muscle cramping 02/06/2022   Left ear pain 02/06/2022   Arthralgia 04/01/2021   Chronic low back pain 03/11/2021   TIA (transient ischemic attack) 06/02/2020   Inguinal hernia 12/05/2019   History of CVA (cerebrovascular accident) without residual deficits 08/13/2019   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) 07/02/2019   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   Cervical stenosis of spinal canal 11/16/2018   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   Herpes zoster without complication 11/16/2018   Essential hypertension 11/01/2018   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/01/2018   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 11/01/2018   History of colon polyps 11/01/2018   History of colonic diverticulitis 11/01/2018   Paresthesia of left arm 11/01/2018   Nocturia 11/01/2018   Sensory loss 11/01/2018    Past Surgical History:  Procedure Laterality Date   broken bone repair     Nose   COLONOSCOPY     [facility] per patient about 15 years ago   FLEXIBLE SIGMOIDOSCOPY N/A 04/27/2023   Procedure: FLEXIBLE SIGMOIDOSCOPY;  Surgeon: Teresa Lonni HERO, MD;  Location: [facility];  Service: General;  Laterality: N/A;   HERNIA REPAIR     skin  cancer removed  10/2021   forehead   TONSILLECTOMY     XI ROBOTIC ASSISTED LOWER ANTERIOR RESECTION N/A 04/27/2023   Procedure: XI ROBOTIC ASSISTED LOWER ANTERIOR RESECTION WITH COLORECTAL ANASTOMOSIS;  Surgeon: Teresa Lonni HERO, MD;  Location: [facility];  Service: General;  Laterality: N/A;       Home Medications    Prior to Admission medications   Medication Sig Start Date End Date Taking? Authorizing Provider  acetaminophen  (TYLENOL ) 325 MG tablet Take 650 mg by mouth every 6 (six) hours as needed for moderate pain (pain score 4-6).   Yes [provider]  ammonium lactate  (AMLACTIN) 12 % cream APPLY 1 APPLICATION TOPICALLY AS NEEDED FOR DRY SKIN.  12/16/22  Yes McCaughan, Dia D, DPM  apixaban  (ELIQUIS ) 5 MG TABS tablet Take 1 tablet (5 mg total) by mouth 2 (two) times daily. 10/19/23  Yes Bauer, Collin S, PA-C  atorvastatin  (LIPITOR) 40 MG tablet TAKE 1 TABLET EVERY DAY 06/26/23  Yes Alvia Bring, DO  Cyanocobalamin  (B-12 PO) Take 1 tablet by mouth daily.   Yes [provider]  escitalopram  (LEXAPRO ) 10 MG tablet TAKE 1 TABLET EVERY DAY 06/20/23  Yes Alvia Bring, DO  losartan -hydrochlorothiazide  (HYZAAR) 50-12.5 MG tablet TAKE 1 TABLET EVERY DAY 03/02/23  Yes Alvia Bring, DO  metoprolol  succinate (TOPROL -XL) 25 MG 24 hr tablet Take 1 tablet (25 mg total) by mouth daily. 10/19/23  Yes Beola Terrall RAMAN, PA-C  Omega-3 Fatty Acids (FISH OIL) 1000 MG CAPS Take 1,000 mg by mouth daily.   Yes [provider]  predniSONE  (DELTASONE ) 10 MG tablet Take 2 tablets (20 mg total) by mouth daily with breakfast for 5 days. 10/26/23 10/31/23 Yes Annella Prowell  N, FNP  traMADol  (ULTRAM ) 50 MG tablet Take 1 tablet (50 mg total) by mouth every 6 (six) hours as needed. 10/26/23  Yes Dreama, Shulamis Wenberg  N, FNP    Family History Family History  Problem Relation Age of Onset   Leukemia Mother    Non-Hodgkin's lymphoma Mother    Other Father        unsure of medical history   Breast cancer Sister    Colon cancer Neg Hx    Esophageal cancer Neg Hx     Social History Social History   Tobacco Use   Smoking status: Former    Current packs/day: 0.00    Average packs/day: 2.5 packs/day for 29.0 years (72.5 ttl pk-yrs)    Types: Cigarettes    Start date: 70    Quit date: 2001    Years since quitting: 24.6   Smokeless tobacco: Never  Vaping Use   Vaping status: Never Used  Substance Use Topics   Alcohol use: Not Currently   Drug use: Yes    Types: Marijuana    Comment: occasionally     Allergies   Patient has no known allergies.   Review of Systems Review of Systems  Per HPI  Physical Exam Triage Vital Signs ED  Triage Vitals  Encounter Vitals Group     BP 10/26/23 1050 105/74     Girls Systolic BP Percentile --      Girls Diastolic BP Percentile --      Boys Systolic BP Percentile --      Boys Diastolic BP Percentile --      Pulse Rate 10/26/23 1050 60     Resp 10/26/23 1050 18     Temp 10/26/23 1050 97.8 F (36.6 C)     Temp Source 10/26/23 1050  Oral     SpO2 10/26/23 1050 96 %     Weight --      Height --      Head Circumference --      Peak Flow --      Pain Score 10/26/23 1052 8     Pain Loc --      Pain Education --      Exclude from Growth Chart --    No data found.  Updated Vital Signs BP 105/74 (BP Location: Right Arm)   Pulse 60   Temp 97.8 F (36.6 C) (Oral)   Resp 18   SpO2 96%   Visual Acuity Right Eye Distance:   Left Eye Distance:   Bilateral Distance:    Right Eye Near:   Left Eye Near:    Bilateral Near:     Physical Exam Vitals and nursing note reviewed.  Constitutional:      Appearance: Normal appearance.  HENT:     Head: Normocephalic and atraumatic.     Right Ear: External ear normal.     Left Ear: External ear normal.     Nose: Nose normal.     Mouth/Throat:     Mouth: Mucous membranes are moist.  Eyes:     Conjunctiva/sclera: Conjunctivae normal.  Cardiovascular:     Rate and Rhythm: Normal rate.  Pulmonary:     Effort: Pulmonary effort is normal. No respiratory distress.  Musculoskeletal:        General: No swelling, tenderness, deformity or signs of injury.     Lumbar back: No swelling, deformity, tenderness or bony tenderness. Normal range of motion. Positive right straight leg raise test and positive left straight leg raise test.       Back:  Skin:    General: Skin is warm and dry.  Neurological:     General: No focal deficit present.     Mental Status: He is alert and oriented to person, place, and time.  Psychiatric:        Mood and Affect: Mood normal.        Behavior: Behavior normal.      UC Treatments / Results   Labs (all labs ordered are listed, but only abnormal results are displayed) Labs Reviewed - No data to display  EKG   Radiology DG Si Joints Result Date: 10/26/2023 EXAM: XR SI joints 10/26/2023 11:37:14 AM CLINICAL HISTORY: [id number] Chronic SI joint pain [id number]. Table formatting from the original note was not included.; Images from the original note were not included.; Patient c/o low back pain x 10 days, recurrent problem Patient c/o low back pain x 10 days, recurrent problem COMPARISON: None available. TECHNIQUE: AP and oblique views of the SI joints. FINDINGS: Surgical clips are present. These images do not demonstrate findings of bony erosion. There is no marked periarticular sclerosis. There are no findings of fracture or bony destruction. IMPRESSION: 1. No acute findings. Electronically signed by: Evalene Coho MD 10/26/2023 11:47 AM EDT RP Workstation: [id number]   DG Lumbar Spine Complete Result Date: 10/26/2023 EXAM: 4 or more VIEW(S) XRAY OF THE LUMBAR SPINE 10/26/2023 11:37:14 AM COMPARISON: Lumbar spine series dated 03/11/2021. CLINICAL HISTORY: Lumbar spine pain. Patient complains of low back pain for 10 days, recurrent problem. FINDINGS: LUMBAR SPINE: BONES: No acute fracture. No aggressive appearing osseous lesion. Mild dextroscoliosis of the lumbar spine. The T12 ribs are hypoplastic. DISCS AND DEGENERATIVE CHANGES: Mild disc space narrowing and endplate ridging at L3-4 and L4-5. Mild endplate ridging  at L1-2 and L2-3. SOFT TISSUES: No acute abnormality. IMPRESSION: 1. Mild dextroscoliosis of the lumbar spine. 2. Mild disc space narrowing and endplate ridging at L3-4 and L4-5. Electronically signed by: Evalene Coho MD 10/26/2023 11:46 AM EDT RP Workstation: [id number]    Procedures Procedures (including critical care time)  Medications Ordered in UC Medications  dexamethasone  (DECADRON ) injection 10 mg (10 mg Intramuscular Given 10/26/23 1202)    Initial Impression /  Assessment and Plan / UC Course  I have reviewed the triage vital signs and the nursing notes.  Pertinent labs & imaging results that were available during my care of the patient were reviewed by me and considered in my medical decision making (see chart for details).  Vitals and triage reviewed, patient is hemodynamically stable.  Chronic low back pain with flareup.  Without tenderness on exam, atraumatic.  Positive straight leg raise bilaterally.  Negative for cauda equina.  Imaging by my interpretation does not show acute bony abnormality, confirmed with radiology overread.  Radiology did see lumbar scoliosis as well as disc space narrowing.  Recommended follow-up with orthopedic for further advanced imaging, such as MRI.  IM steroid, oral steroid and tramadol  given for acute flareup of chronic back pain.  These have worked well in the past.  Plan of care, follow-up care return precautions given, no questions at this time.    Final Clinical Impressions(s) / UC Diagnoses   Final diagnoses:  Chronic bilateral low back pain with left-sided sciatica     Discharge Instructions      Imaging of your SI joint does not show acute bony abnormalities, surgical clips are in place.  Imaging of your lumbar spine showed mild scoliosis and mild disc space narrowing and endplate ridging at L3-4 and L4-5.   Start oral steroids tomorrow.  Take the tramadol  sparingly.  Please follow-up with orthopedics and they can consider moving forward with advanced imaging such as MRI.  Seek immediate care for any new concerning symptoms.     ED Prescriptions     Medication Sig Dispense Auth. Provider   traMADol  (ULTRAM ) 50 MG tablet Take 1 tablet (50 mg total) by mouth every 6 (six) hours as needed. 15 tablet Dreama, Sumiko Ceasar  N, FNP   predniSONE  (DELTASONE ) 10 MG tablet Take 2 tablets (20 mg total) by mouth daily with breakfast for 5 days. 10 tablet Dreama, Danetta Prom  N, FNP      I have reviewed the PDMP  during this encounter.   Dreama Lolly SAILOR, FNP 10/26/23 1204

## 2023-11-02 ENCOUNTER — Ambulatory Visit (HOSPITAL_COMMUNITY)
Admission: RE | Admit: 2023-11-02 | Discharge: 2023-11-02 | Disposition: A | Discharge status: Home / Self Care | Source: Ambulatory Visit | Attending: Physician Assistant | Admitting: Physician Assistant

## 2023-11-02 VITALS — BP 110/72 | HR 62 | Ht 74.0 in | Wt 214.4 lb

## 2023-11-02 DIAGNOSIS — I4891 Unspecified atrial fibrillation: Secondary | ICD-10-CM

## 2023-11-02 DIAGNOSIS — D6869 Other thrombophilia: Secondary | ICD-10-CM | POA: Diagnosis not present

## 2023-11-02 DIAGNOSIS — I483 Typical atrial flutter: Secondary | ICD-10-CM | POA: Diagnosis not present

## 2023-11-02 MED ORDER — APIXABAN 5 MG PO TABS: 5.0000 mg | ORAL_TABLET | Freq: Two times a day (BID) | ORAL | 6 refills | Status: AC

## 2023-11-02 MED ORDER — APIXABAN 5 MG PO TABS: 5.0000 mg | ORAL_TABLET | Freq: Two times a day (BID) | ORAL | 6 refills | Status: DC

## 2023-11-02 NOTE — H&P (View-Only) (Signed)
 Primary Care Physician: Alvia Bring, DO Primary Cardiologist: None Electrophysiologist: None  Referring Physician: ED   William Wheeler is a 73 y.o. male with a history of HTN, CVA, previous alcohol abuse, diverticulitis, HLD, atrial fibrillation who presents for consultation in the [facility].  The patient was initially diagnosed with atrial flutter after presenting to the ED 10/19/23 with symptoms of palpitations and SOB. ECG showed atrial flutter with 2:1 and 3:1 block. Patient was started on Eliquis  for stroke prevention and metoprolol  for rate control.    Today, patient remains in rate controlled atrial flutter. Not currently symptomatic. There were no specific triggers that he could identify. No bleeding issues on anticoagulation.   Today, he denies symptoms of palpitations, chest pain, shortness of breath, orthopnea, PND, lower extremity edema, dizziness, presyncope, syncope, snoring, daytime somnolence, bleeding, or neurologic sequela. The patient is tolerating medications without difficulties and is otherwise without complaint today.    Atrial Fibrillation Risk Factors:  he does not have symptoms or diagnosis of sleep apnea. he does not have a history of rheumatic fever. he does not have a history of alcohol use.   Atrial Fibrillation Management history:  Previous antiarrhythmic drugs: none Previous cardioversions: none Previous ablations: none Anticoagulation history: Eliquis   ROS- All systems are reviewed and negative except as per the HPI above.  Past Medical History:  Diagnosis Date   Alcohol abuse    Alcohol addiction (HCC)    Arthritis    Colon polyps    Depression    Diverticulitis    Diverticulitis    History of colon polyps    History of hiatal hernia    Hypercholesteremia    Hypertension    Infectious colitis    Skin cancer    Stroke [facility])     Current Outpatient Medications  Medication Sig Dispense Refill    acetaminophen  (TYLENOL ) 325 MG tablet Take 650 mg by mouth every 6 (six) hours as needed for moderate pain (pain score 4-6).     ammonium lactate  (AMLACTIN) 12 % cream APPLY 1 APPLICATION TOPICALLY AS NEEDED FOR DRY SKIN. 385 g 0   apixaban  (ELIQUIS ) 5 MG TABS tablet Take 1 tablet (5 mg total) by mouth 2 (two) times daily. 60 tablet 0   atorvastatin  (LIPITOR) 40 MG tablet TAKE 1 TABLET EVERY DAY 90 tablet 3   Cyanocobalamin  (B-12 PO) Take 1 tablet by mouth daily.     escitalopram  (LEXAPRO ) 10 MG tablet TAKE 1 TABLET EVERY DAY 90 tablet 3   losartan -hydrochlorothiazide  (HYZAAR) 50-12.5 MG tablet TAKE 1 TABLET EVERY DAY 90 tablet 3   metoprolol  succinate (TOPROL -XL) 25 MG 24 hr tablet Take 1 tablet (25 mg total) by mouth daily. 30 tablet 0   Omega-3 Fatty Acids (FISH OIL) 1000 MG CAPS Take 1,000 mg by mouth daily.     traMADol  (ULTRAM ) 50 MG tablet Take 1 tablet (50 mg total) by mouth every 6 (six) hours as needed. 15 tablet 0   No current facility-administered medications for this encounter.    Physical Exam: BP 110/72   Pulse 62   Ht 6' 2 (1.88 m)   Wt 97.3 kg   BMI 27.53 kg/m   GEN: Well nourished, well developed in no acute distress NECK: No JVD CARDIAC: Irregularly irregular rate and rhythm, no murmurs, rubs, gallops RESPIRATORY:  Clear to auscultation without rales, wheezing or rhonchi  ABDOMEN: Soft, non-tender, non-distended EXTREMITIES:  No edema; No deformity   Wt Readings from  Last 3 Encounters:  11/02/23 97.3 kg  10/19/23 93.9 kg  08/10/23 95.9 kg     EKG today demonstrates  Typical atrial flutter with variable block Vent. rate 62 BPM PR interval * ms QRS duration 102 ms QT/QTcB 390/395 ms   Echo 07/16/19 demonstrated   1. Left ventricular ejection fraction, by estimation, is 55 to 60%. The  left ventricle has normal function. The left ventricle has no regional  wall motion abnormalities. Left ventricular diastolic parameters were  normal.   2. Right  ventricular systolic function is normal. The right ventricular  size is normal. There is normal pulmonary artery systolic pressure.   3. The mitral valve is normal in structure. No evidence of mitral valve  regurgitation. No evidence of mitral stenosis.   4. The aortic valve is normal in structure. Aortic valve regurgitation is  mild. No aortic stenosis is present.   5. The inferior vena cava is normal in size with greater than 50%  respiratory variability, suggesting right atrial pressure of 3 mmHg.    CHA2DS2-VASc Score = 4  The patient's score is based upon: CHF History: 0 HTN History: 1 Diabetes History: 0 Stroke History: 2 Vascular Disease History: 0 Age Score: 1 Gender Score: 0       ASSESSMENT AND PLAN: Typical atrial flutter The patient's CHA2DS2-VASc score is 4, indicating a 4.8% annual risk of stroke.   General education about atrial flutter provided and questions answered. We also discussed his stroke risk and the risks and benefits of anticoagulation. Continue Eliquis  5 mg BID Continue Toprol  25 mg daily We discussed rhythm control options today. Will plan for DCCV after >3 weeks of anticoagulation.  Long term, if he has recurrence can consider CTI ablation.   Secondary Hypercoagulable State (ICD10:  D68.69) The patient is at significant risk for stroke/thromboembolism based upon his CHA2DS2-VASc Score of 4.  Continue Apixaban  (Eliquis ). No bleeding issues.   HTN Stable on current regimen   Follow up in the AF clinic post DCCV.    Informed Consent   Shared Decision Making/Informed Consent The risks (stroke, cardiac arrhythmias rarely resulting in the need for a temporary or permanent pacemaker, skin irritation or burns and complications associated with conscious sedation including aspiration, arrhythmia, respiratory failure and death), benefits (restoration of normal sinus rhythm) and alternatives of a direct current cardioversion were explained in detail to [name] and he agrees to proceed.        [facility] [address] [place], [place] [address] [phone]

## 2023-11-02 NOTE — Patient Instructions (Signed)
 Cardioversion scheduled for: 11/10/23 Friday at 7:00am   - Arrive at the [facility] ([address]  and check in with ADMITTING at 7:00am    - Do not eat or drink anything after midnight the night prior to your procedure.   - Take all your morning medication (except diabetic medications) with a sip of water  prior to arrival.  - Do NOT miss any doses of your blood thinner - if you should miss a dose or take a dose more than 4 hours late -- please notify our office immediately.  - You will not be able to drive home after your procedure. Please ensure you have a responsible adult to drive you home. You will need someone with you for 24 hours post procedure.     - Expect to be in the procedural area approximately 2 hours.   - If you feel as if you go back into normal rhythm prior to scheduled cardioversion, please notify our office immediately.   If your procedure is canceled in the cardioversion suite you will be charged a cancellation fee.

## 2023-11-02 NOTE — Progress Notes (Signed)
 Primary Care Physician: Alvia Bring, DO Primary Cardiologist: None Electrophysiologist: None  Referring Physician: ED   William Wheeler is a 73 y.o. male with a history of HTN, CVA, previous alcohol abuse, diverticulitis, HLD, atrial fibrillation who presents for consultation in the [facility].  The patient was initially diagnosed with atrial flutter after presenting to the ED 10/19/23 with symptoms of palpitations and SOB. ECG showed atrial flutter with 2:1 and 3:1 block. Patient was started on Eliquis  for stroke prevention and metoprolol  for rate control.    Today, patient remains in rate controlled atrial flutter. Not currently symptomatic. There were no specific triggers that he could identify. No bleeding issues on anticoagulation.   Today, he denies symptoms of palpitations, chest pain, shortness of breath, orthopnea, PND, lower extremity edema, dizziness, presyncope, syncope, snoring, daytime somnolence, bleeding, or neurologic sequela. The patient is tolerating medications without difficulties and is otherwise without complaint today.    Atrial Fibrillation Risk Factors:  he does not have symptoms or diagnosis of sleep apnea. he does not have a history of rheumatic fever. he does not have a history of alcohol use.   Atrial Fibrillation Management history:  Previous antiarrhythmic drugs: none Previous cardioversions: none Previous ablations: none Anticoagulation history: Eliquis   ROS- All systems are reviewed and negative except as per the HPI above.  Past Medical History:  Diagnosis Date   Alcohol abuse    Alcohol addiction (HCC)    Arthritis    Colon polyps    Depression    Diverticulitis    Diverticulitis    History of colon polyps    History of hiatal hernia    Hypercholesteremia    Hypertension    Infectious colitis    Skin cancer    Stroke [facility])     Current Outpatient Medications  Medication Sig Dispense Refill    acetaminophen  (TYLENOL ) 325 MG tablet Take 650 mg by mouth every 6 (six) hours as needed for moderate pain (pain score 4-6).     ammonium lactate  (AMLACTIN) 12 % cream APPLY 1 APPLICATION TOPICALLY AS NEEDED FOR DRY SKIN. 385 g 0   apixaban  (ELIQUIS ) 5 MG TABS tablet Take 1 tablet (5 mg total) by mouth 2 (two) times daily. 60 tablet 0   atorvastatin  (LIPITOR) 40 MG tablet TAKE 1 TABLET EVERY DAY 90 tablet 3   Cyanocobalamin  (B-12 PO) Take 1 tablet by mouth daily.     escitalopram  (LEXAPRO ) 10 MG tablet TAKE 1 TABLET EVERY DAY 90 tablet 3   losartan -hydrochlorothiazide  (HYZAAR) 50-12.5 MG tablet TAKE 1 TABLET EVERY DAY 90 tablet 3   metoprolol  succinate (TOPROL -XL) 25 MG 24 hr tablet Take 1 tablet (25 mg total) by mouth daily. 30 tablet 0   Omega-3 Fatty Acids (FISH OIL) 1000 MG CAPS Take 1,000 mg by mouth daily.     traMADol  (ULTRAM ) 50 MG tablet Take 1 tablet (50 mg total) by mouth every 6 (six) hours as needed. 15 tablet 0   No current facility-administered medications for this encounter.    Physical Exam: BP 110/72   Pulse 62   Ht 6' 2 (1.88 m)   Wt 97.3 kg   BMI 27.53 kg/m   GEN: Well nourished, well developed in no acute distress NECK: No JVD CARDIAC: Irregularly irregular rate and rhythm, no murmurs, rubs, gallops RESPIRATORY:  Clear to auscultation without rales, wheezing or rhonchi  ABDOMEN: Soft, non-tender, non-distended EXTREMITIES:  No edema; No deformity   Wt Readings from  Last 3 Encounters:  11/02/23 97.3 kg  10/19/23 93.9 kg  08/10/23 95.9 kg     EKG today demonstrates  Typical atrial flutter with variable block Vent. rate 62 BPM PR interval * ms QRS duration 102 ms QT/QTcB 390/395 ms   Echo 07/16/19 demonstrated   1. Left ventricular ejection fraction, by estimation, is 55 to 60%. The  left ventricle has normal function. The left ventricle has no regional  wall motion abnormalities. Left ventricular diastolic parameters were  normal.   2. Right  ventricular systolic function is normal. The right ventricular  size is normal. There is normal pulmonary artery systolic pressure.   3. The mitral valve is normal in structure. No evidence of mitral valve  regurgitation. No evidence of mitral stenosis.   4. The aortic valve is normal in structure. Aortic valve regurgitation is  mild. No aortic stenosis is present.   5. The inferior vena cava is normal in size with greater than 50%  respiratory variability, suggesting right atrial pressure of 3 mmHg.    CHA2DS2-VASc Score = 4  The patient's score is based upon: CHF History: 0 HTN History: 1 Diabetes History: 0 Stroke History: 2 Vascular Disease History: 0 Age Score: 1 Gender Score: 0       ASSESSMENT AND PLAN: Typical atrial flutter The patient's CHA2DS2-VASc score is 4, indicating a 4.8% annual risk of stroke.   General education about atrial flutter provided and questions answered. We also discussed his stroke risk and the risks and benefits of anticoagulation. Continue Eliquis  5 mg BID Continue Toprol  25 mg daily We discussed rhythm control options today. Will plan for DCCV after >3 weeks of anticoagulation.  Long term, if he has recurrence can consider CTI ablation.   Secondary Hypercoagulable State (ICD10:  D68.69) The patient is at significant risk for stroke/thromboembolism based upon his CHA2DS2-VASc Score of 4.  Continue Apixaban  (Eliquis ). No bleeding issues.   HTN Stable on current regimen   Follow up in the AF clinic post DCCV.    Informed Consent   Shared Decision Making/Informed Consent The risks (stroke, cardiac arrhythmias rarely resulting in the need for a temporary or permanent pacemaker, skin irritation or burns and complications associated with conscious sedation including aspiration, arrhythmia, respiratory failure and death), benefits (restoration of normal sinus rhythm) and alternatives of a direct current cardioversion were explained in detail to Mr.  Wheeler and he agrees to proceed.        [facility] [address] [place], [place] [address] [phone]

## 2023-11-07 ENCOUNTER — Encounter: Payer: Self-pay | Admitting: Sports Medicine

## 2023-11-09 ENCOUNTER — Ambulatory Visit: Admitting: Family Medicine

## 2023-11-09 NOTE — Progress Notes (Signed)
 Left detailed VM for  patient and instructed them to come at 0700  and to be NPO after 0000.  Medications reviewed and patient instructed to take Eliquis .   Advised that patient have a ride home and someone to stay with them for 24 hours after the procedure.   Advised  no breaks in taking blood thinner for 3+ weeks prior to procedure.  Left number to call back if they have additional questions.

## 2023-11-10 ENCOUNTER — Other Ambulatory Visit: Payer: Self-pay

## 2023-11-10 ENCOUNTER — Ambulatory Visit (HOSPITAL_COMMUNITY)

## 2023-11-10 ENCOUNTER — Encounter (HOSPITAL_COMMUNITY)
Admission: RE | Disposition: A | Discharge status: Home / Self Care | Payer: Self-pay | Source: Home / Self Care | Attending: Cardiology

## 2023-11-10 ENCOUNTER — Ambulatory Visit (HOSPITAL_COMMUNITY)
Admission: RE | Admit: 2023-11-10 | Discharge: 2023-11-10 | Disposition: A | Discharge status: Home / Self Care | Attending: Cardiology | Admitting: Cardiology

## 2023-11-10 DIAGNOSIS — I10 Essential (primary) hypertension: Secondary | ICD-10-CM

## 2023-11-10 DIAGNOSIS — I483 Typical atrial flutter: Secondary | ICD-10-CM | POA: Diagnosis not present

## 2023-11-10 DIAGNOSIS — I679 Cerebrovascular disease, unspecified: Secondary | ICD-10-CM | POA: Diagnosis not present

## 2023-11-10 DIAGNOSIS — Z7901 Long term (current) use of anticoagulants: Secondary | ICD-10-CM | POA: Diagnosis not present

## 2023-11-10 DIAGNOSIS — E785 Hyperlipidemia, unspecified: Secondary | ICD-10-CM | POA: Insufficient documentation

## 2023-11-10 DIAGNOSIS — I4891 Unspecified atrial fibrillation: Secondary | ICD-10-CM | POA: Diagnosis not present

## 2023-11-10 DIAGNOSIS — Z79899 Other long term (current) drug therapy: Secondary | ICD-10-CM | POA: Diagnosis not present

## 2023-11-10 DIAGNOSIS — Z87891 Personal history of nicotine dependence: Secondary | ICD-10-CM

## 2023-11-10 DIAGNOSIS — Z8673 Personal history of transient ischemic attack (TIA), and cerebral infarction without residual deficits: Secondary | ICD-10-CM | POA: Diagnosis not present

## 2023-11-10 DIAGNOSIS — I4892 Unspecified atrial flutter: Secondary | ICD-10-CM | POA: Diagnosis not present

## 2023-11-10 DIAGNOSIS — D6869 Other thrombophilia: Secondary | ICD-10-CM | POA: Diagnosis not present

## 2023-11-10 HISTORY — PX: CARDIOVERSION: EP1203

## 2023-11-10 SURGERY — CARDIOVERSION (CATH LAB)
Anesthesia: General

## 2023-11-10 MED ORDER — PROPOFOL 10 MG/ML IV BOLUS
INTRAVENOUS | Status: DC | PRN
  Administered 2023-11-10: 10 mg via INTRAVENOUS
  Administered 2023-11-10: 80 mg via INTRAVENOUS

## 2023-11-10 MED ORDER — LIDOCAINE 2% (20 MG/ML) 5 ML SYRINGE
INTRAMUSCULAR | Status: DC | PRN
  Administered 2023-11-10: 100 mg via INTRAVENOUS

## 2023-11-10 MED ORDER — SODIUM CHLORIDE 0.9% FLUSH
INTRAVENOUS | Status: DC | PRN
  Administered 2023-11-10: 3 mL via INTRAVENOUS

## 2023-11-10 MED ORDER — SODIUM CHLORIDE 0.9 % IV SOLN: INTRAVENOUS | Status: DC

## 2023-11-10 SURGICAL SUPPLY — 1 items: PAD DEFIB RADIO PHYSIO CONN (PAD) ×1 IMPLANT

## 2023-11-10 NOTE — Interval H&P Note (Signed)
 History and Physical Interval Note:  11/10/2023 7:52 AM  William Wheeler  has presented today for surgery, with the diagnosis of AFIB.  The various methods of treatment have been discussed with the patient and family. After consideration of risks, benefits and other options for treatment, the patient has consented to  Procedure(s): CARDIOVERSION (N/A) as a surgical intervention.  The patient's history has been reviewed, patient examined, no change in status, stable for surgery.  I have reviewed the patient's chart and labs.  Questions were answered to the patient's satisfaction.     [name]

## 2023-11-10 NOTE — Anesthesia Postprocedure Evaluation (Signed)
 Anesthesia Post Note  Patient: William Wheeler  Procedure(s) Performed: CARDIOVERSION     Patient location during evaluation: Cath Lab Anesthesia Type: General Level of consciousness: awake and alert Pain management: pain level controlled Vital Signs Assessment: post-procedure vital signs reviewed and stable Respiratory status: spontaneous breathing, nonlabored ventilation, respiratory function stable and patient connected to nasal cannula oxygen Cardiovascular status: blood pressure returned to baseline and stable Postop Assessment: no apparent nausea or vomiting Anesthetic complications: no   No notable events documented.  Last Vitals:  Vitals:   11/10/23 0823 11/10/23 0833  BP: 107/63 106/71  Pulse: (!) 43 (!) 43  Resp: 13 13  Temp:    SpO2: 97% 98%    Last Pain:  Vitals:   11/10/23 0833  TempSrc:   PainSc: 0-No pain                 [name]

## 2023-11-10 NOTE — CV Procedure (Signed)
    Electrical Cardioversion Procedure Note William Wheeler [id number] 12/26/1950  Procedure: Electrical Cardioversion Indications:  Atrial Flutter  Time Out: Verified patient identification, verified procedure,medications/allergies/relevent history reviewed, required imaging and test results available.  Performed  Procedure Details  During this procedure the patient is administered a total of Propofol  90 mg and Lidocaine  100 mg to achieve and maintain moderate conscious sedation.  The patient's heart rate, blood pressure, and oxygen saturation are monitored continuously during the procedure. The period of conscious sedation is 3 minutes, of which I was present face-to-face 100% of this time. William Hancock, CRNA is an independent, trained observer who assisted in the monitoring of the patient's level of consciousness.     Cardioversion was done with synchronized biphasic defibrillation with AP pads with 200watts.  The patient converted to normal sinus rhythm. The patient tolerated the procedure well   IMPRESSION:  Successful cardioversion of atrial flutter    William Wheeler 11/10/2023, 7:52 AM

## 2023-11-10 NOTE — Transfer of Care (Signed)
 Immediate Anesthesia Transfer of Care Note  Patient: William Wheeler  Procedure(s) Performed: CARDIOVERSION  Patient Location: Cath Lab  Anesthesia Type:MAC  Level of Consciousness: drowsy, patient cooperative, and responds to stimulation  Airway & Oxygen Therapy: Patient Spontanous Breathing and Patient connected to nasal cannula oxygen  Post-op Assessment: Report given to RN and Post -op Vital signs reviewed and stable  Post vital signs: Reviewed and stable  Last Vitals:  Vitals Value Taken Time  BP 107/79 11/10/23 08:13  Temp 36.5 C 11/10/23 08:13  Pulse 44 11/10/23 08:13  Resp 10 11/10/23 08:13  SpO2 97 % 11/10/23 08:13    Last Pain:  Vitals:   11/10/23 0813  TempSrc: Tympanic  PainSc: Asleep         Complications: No notable events documented.

## 2023-11-10 NOTE — Discharge Instructions (Signed)

## 2023-11-10 NOTE — Anesthesia Preprocedure Evaluation (Signed)
 Anesthesia Evaluation  Patient identified by MRN, date of birth, ID band Patient awake    Reviewed: Allergy & Precautions, NPO status , Patient's Chart, lab work & pertinent test results  History of Anesthesia Complications Negative for: history of anesthetic complications  Airway Mallampati: II  TM Distance: >3 FB Neck ROM: Full    Dental  (+) Teeth Intact, Dental Advisory Given   Pulmonary neg pulmonary ROS, neg sleep apnea, neg COPD, Patient abstained from smoking.Not current smoker, former smoker   breath sounds clear to auscultation       Cardiovascular Exercise Tolerance: Good METShypertension, Pt. on medications (-) angina (-) CAD, (-) Past MI and (-) CHF + dysrhythmias Atrial Fibrillation  Rhythm:Irregular  1. Left ventricular ejection fraction, by estimation, is 55 to 60%. The  left ventricle has normal function. The left ventricle has no regional  wall motion abnormalities. Left ventricular diastolic parameters were  normal.   2. Right ventricular systolic function is normal. The right ventricular  size is normal. There is normal pulmonary artery systolic pressure.   3. The mitral valve is normal in structure. No evidence of mitral valve  regurgitation. No evidence of mitral stenosis.   4. The aortic valve is normal in structure. Aortic valve regurgitation is  mild. No aortic stenosis is present.   5. The inferior vena cava is normal in size with greater than 50%  respiratory variability, suggesting right atrial pressure of 3 mmHg.     Neuro/Psych  PSYCHIATRIC DISORDERS  Depression    CVA, No Residual Symptoms    GI/Hepatic hiatal hernia,neg GERD  ,,(+)     substance abuse  marijuana use  Endo/Other  negative endocrine ROSneg diabetes    Renal/GU negative Renal ROS     Musculoskeletal  (+) Arthritis ,    Abdominal   Peds  Hematology negative hematology ROS (+) Lab Results      Component                 Value               Date                      WBC                      7.5                 04/20/2023                HGB                      14.2                04/20/2023                HCT                      44.0                04/20/2023                MCV                      88.2                04/20/2023                PLT  224                 04/20/2023              Anesthesia Other Findings Past Medical History: No date: Alcohol abuse No date: Alcohol addiction (HCC) No date: Arthritis No date: Colon polyps No date: Depression No date: Diverticulitis No date: Diverticulitis No date: History of colon polyps No date: History of hiatal hernia No date: Hypercholesteremia No date: Hypertension No date: Infectious colitis No date: Skin cancer No date: Stroke [facility])  Reproductive/Obstetrics                              Anesthesia Physical Anesthesia Plan  ASA: 3  Anesthesia Plan: General   Post-op Pain Management: Minimal or no pain anticipated   Induction: Intravenous  PONV Risk Score and Plan: 2 and Propofol  infusion, TIVA and Ondansetron   Airway Management Planned: Nasal Cannula  Additional Equipment: None  Intra-op Plan:   Post-operative Plan:   Informed Consent: I have reviewed the patients History and Physical, chart, labs and discussed the procedure including the risks, benefits and alternatives for the proposed anesthesia with the patient or authorized representative who has indicated his/her understanding and acceptance.     Dental advisory given  Plan Discussed with: CRNA and Surgeon  Anesthesia Plan Comments: (Discussed risks of anesthesia with patient, including possibility of difficulty with spontaneous ventilation under anesthesia necessitating airway intervention, PONV, and rare risks such as cardiac or respiratory or neurological events, and allergic reactions. Discussed the role of CRNA in patient's  perioperative care. Patient understands.)         Anesthesia Quick Evaluation

## 2023-11-12 ENCOUNTER — Encounter (HOSPITAL_COMMUNITY): Payer: Self-pay | Admitting: Cardiology

## 2023-11-23 ENCOUNTER — Encounter (HOSPITAL_COMMUNITY): Payer: Self-pay

## 2023-11-23 ENCOUNTER — Ambulatory Visit (HOSPITAL_COMMUNITY): Admitting: Physician Assistant

## 2023-11-23 NOTE — Progress Notes (Incomplete)
 Primary Care Physician: Alvia Bring, DO Primary Cardiologist: None Electrophysiologist: None  Referring Physician: ED   William Wheeler is a 73 y.o. male with a history of HTN, CVA, previous alcohol abuse, diverticulitis, HLD, atrial fibrillation who presents for consultation in the [facility].  The patient was initially diagnosed with atrial flutter after presenting to the ED 10/19/23 with symptoms of palpitations and SOB. ECG showed atrial flutter with 2:1 and 3:1 block. Patient was started on Eliquis  for stroke prevention and metoprolol  for rate control.    Today, patient remains in rate controlled atrial flutter. Not currently symptomatic. There were no specific triggers that he could identify. No bleeding issues on anticoagulation.   Today, he denies symptoms of palpitations, chest pain, shortness of breath, orthopnea, PND, lower extremity edema, dizziness, presyncope, syncope, snoring, daytime somnolence, bleeding, or neurologic sequela. The patient is tolerating medications without difficulties and is otherwise without complaint today.    Atrial Fibrillation Risk Factors:  he does not have symptoms or diagnosis of sleep apnea. he does not have a history of rheumatic fever. he does not have a history of alcohol use.   Atrial Fibrillation Management history:  Previous antiarrhythmic drugs: none Previous cardioversions: none Previous ablations: none Anticoagulation history: Eliquis   ROS- All systems are reviewed and negative except as per the HPI above.  Past Medical History:  Diagnosis Date   Alcohol abuse    Alcohol addiction (HCC)    Arthritis    Colon polyps    Depression    Diverticulitis    Diverticulitis    History of colon polyps    History of hiatal hernia    Hypercholesteremia    Hypertension    Infectious colitis    Skin cancer    Stroke [facility])     Current Outpatient Medications  Medication Sig Dispense Refill    acetaminophen  (TYLENOL ) 325 MG tablet Take 650 mg by mouth every 6 (six) hours as needed for moderate pain (pain score 4-6).     ammonium lactate  (AMLACTIN) 12 % cream APPLY 1 APPLICATION TOPICALLY AS NEEDED FOR DRY SKIN. 385 g 0   apixaban  (ELIQUIS ) 5 MG TABS tablet Take 1 tablet (5 mg total) by mouth 2 (two) times daily. 60 tablet 6   atorvastatin  (LIPITOR) 40 MG tablet TAKE 1 TABLET EVERY DAY 90 tablet 3   Cyanocobalamin  (B-12 PO) Take 1 tablet by mouth daily.     escitalopram  (LEXAPRO ) 10 MG tablet TAKE 1 TABLET EVERY DAY 90 tablet 3   losartan -hydrochlorothiazide  (HYZAAR) 50-12.5 MG tablet TAKE 1 TABLET EVERY DAY 90 tablet 3   metoprolol  succinate (TOPROL -XL) 25 MG 24 hr tablet Take 1 tablet (25 mg total) by mouth daily. 30 tablet 0   Omega-3 Fatty Acids (FISH OIL) 1000 MG CAPS Take 1,000 mg by mouth daily.     No current facility-administered medications for this visit.    Physical Exam: There were no vitals taken for this visit.  GEN: Well nourished, well developed in no acute distress NECK: No JVD CARDIAC: Irregularly irregular rate and rhythm, no murmurs, rubs, gallops RESPIRATORY:  Clear to auscultation without rales, wheezing or rhonchi  ABDOMEN: Soft, non-tender, non-distended EXTREMITIES:  No edema; No deformity   Wt Readings from Last 3 Encounters:  11/02/23 97.3 kg  10/19/23 93.9 kg  08/10/23 95.9 kg     EKG today demonstrates  Typical atrial flutter with variable block Vent. rate 62 BPM PR interval * ms QRS duration 102  ms QT/QTcB 390/395 ms   Echo 07/16/19 demonstrated   1. Left ventricular ejection fraction, by estimation, is 55 to 60%. The  left ventricle has normal function. The left ventricle has no regional  wall motion abnormalities. Left ventricular diastolic parameters were  normal.   2. Right ventricular systolic function is normal. The right ventricular  size is normal. There is normal pulmonary artery systolic pressure.   3. The mitral valve  is normal in structure. No evidence of mitral valve  regurgitation. No evidence of mitral stenosis.   4. The aortic valve is normal in structure. Aortic valve regurgitation is  mild. No aortic stenosis is present.   5. The inferior vena cava is normal in size with greater than 50%  respiratory variability, suggesting right atrial pressure of 3 mmHg.    CHA2DS2-VASc Score = 4  The patient's score is based upon: CHF History: 0 HTN History: 1 Diabetes History: 0 Stroke History: 2 Vascular Disease History: 0 Age Score: 1 Gender Score: 0       ASSESSMENT AND PLAN: Typical atrial flutter The patient's CHA2DS2-VASc score is 4, indicating a 4.8% annual risk of stroke.   General education about atrial flutter provided and questions answered. We also discussed his stroke risk and the risks and benefits of anticoagulation. Continue Eliquis  5 mg BID Continue Toprol  25 mg daily We discussed rhythm control options today. Will plan for DCCV after >3 weeks of anticoagulation.  Long term, if he has recurrence can consider CTI ablation.   Secondary Hypercoagulable State (ICD10:  D68.69) The patient is at significant risk for stroke/thromboembolism based upon his CHA2DS2-VASc Score of 4.  Continue Apixaban  (Eliquis ). No bleeding issues.   HTN Stable on current regimen   Follow up ***    [facility] [address] [place], [place] [address] [phone]

## 2023-11-24 ENCOUNTER — Other Ambulatory Visit: Payer: Self-pay

## 2023-11-24 ENCOUNTER — Emergency Department (HOSPITAL_BASED_OUTPATIENT_CLINIC_OR_DEPARTMENT_OTHER)
Admission: EM | Admit: 2023-11-24 | Discharge: 2023-11-24 | Disposition: A | Discharge status: Home / Self Care | Attending: Emergency Medicine | Admitting: Emergency Medicine

## 2023-11-24 ENCOUNTER — Emergency Department (HOSPITAL_BASED_OUTPATIENT_CLINIC_OR_DEPARTMENT_OTHER)

## 2023-11-24 ENCOUNTER — Encounter (HOSPITAL_BASED_OUTPATIENT_CLINIC_OR_DEPARTMENT_OTHER): Payer: Self-pay | Admitting: Emergency Medicine

## 2023-11-24 ENCOUNTER — Other Ambulatory Visit (HOSPITAL_COMMUNITY): Payer: Self-pay | Admitting: *Deleted

## 2023-11-24 DIAGNOSIS — Z7901 Long term (current) use of anticoagulants: Secondary | ICD-10-CM | POA: Insufficient documentation

## 2023-11-24 DIAGNOSIS — I4892 Unspecified atrial flutter: Secondary | ICD-10-CM | POA: Diagnosis not present

## 2023-11-24 DIAGNOSIS — R002 Palpitations: Secondary | ICD-10-CM | POA: Diagnosis not present

## 2023-11-24 LAB — CBC
HCT: 40.8 % (ref 39.0–52.0)
Hemoglobin: 13.8 g/dL (ref 13.0–17.0)
MCH: 29.4 pg (ref 26.0–34.0)
MCHC: 33.8 g/dL (ref 30.0–36.0)
MCV: 87 fL (ref 80.0–100.0)
Platelets: 241 K/uL (ref 150–400)
RBC: 4.69 MIL/uL (ref 4.22–5.81)
RDW: 14.8 % (ref 11.5–15.5)
WBC: 7.9 K/uL (ref 4.0–10.5)
nRBC: 0 % (ref 0.0–0.2)

## 2023-11-24 LAB — BASIC METABOLIC PANEL WITH GFR
Anion gap: 14 (ref 5–15)
BUN: 29 mg/dL — ABNORMAL HIGH (ref 8–23)
CO2: 21 mmol/L — ABNORMAL LOW (ref 22–32)
Calcium: 9.4 mg/dL (ref 8.9–10.3)
Chloride: 105 mmol/L (ref 98–111)
Creatinine, Ser: 1.5 mg/dL — ABNORMAL HIGH (ref 0.61–1.24)
GFR, Estimated: 50 mL/min — ABNORMAL LOW (ref >60)
Glucose, Bld: 144 mg/dL — ABNORMAL HIGH (ref 70–99)
Potassium: 3.9 mmol/L (ref 3.5–5.1)
Sodium: 140 mmol/L (ref 135–145)

## 2023-11-24 LAB — TROPONIN T, HIGH SENSITIVITY: Troponin T High Sensitivity: <15 ng/L (ref 0–19)

## 2023-11-24 MED ORDER — METOPROLOL SUCCINATE ER 25 MG PO TB24: 25.0000 mg | ORAL_TABLET | Freq: Every day | ORAL | 6 refills | Status: DC

## 2023-11-24 NOTE — Discharge Instructions (Addendum)
 Keep your scheduled cardiology appointment in regard to your atrial flutter.  Continue your Eliquis  and metoprolol  as previously directed by cardiology.  Return to the emergency department if you experience chest pain, shortness of breath, if you feel like you may lose consciousness, or if you experience sudden lower extremity swelling.

## 2023-11-24 NOTE — ED Provider Notes (Signed)
 [place] [facility] Provider Note   CSN: [id number] Arrival date & time: 11/24/23  1449     Patient presents with: Palpitations   William Wheeler is a 73 y.o. male.   73 year old male presenting with palpitations.  Patient with history of atrial fibrillation/atrial flutter, he underwent a cardioversion on 9/5, he has been in his normal state of health since that time without palpitations until today around 1230 when his symptoms returned.  He mentions that the symptoms occurred shortly after he got very upset about something, he is wondering if this may have triggered his symptoms.  He checked his heart rate on his watch earlier and was told that he was in A-fib/flutter, thus causing him to present to the emergency department.  He was scheduled to follow-up with the atrial fibrillation clinic yesterday but unfortunately missed his appointment.  At time of my examination he tells me that the palpitations have resolved, he suspects that they lasted between 2 and 3 hours.  He is on metoprolol  and Eliquis  and is compliant with these medications.  He denies chest pain, shortness of breath, presyncope/syncope.   Palpitations      Prior to Admission medications   Medication Sig Start Date End Date Taking? Authorizing Provider  acetaminophen  (TYLENOL ) 325 MG tablet Take 650 mg by mouth every 6 (six) hours as needed for moderate pain (pain score 4-6).    [provider]  ammonium lactate  (AMLACTIN) 12 % cream APPLY 1 APPLICATION TOPICALLY AS NEEDED FOR DRY SKIN. 12/16/22   McCaughan, Dia D, DPM  apixaban  (ELIQUIS ) 5 MG TABS tablet Take 1 tablet (5 mg total) by mouth 2 (two) times daily. 11/02/23   Fenton, Clint R, PA  atorvastatin  (LIPITOR) 40 MG tablet TAKE 1 TABLET EVERY DAY 06/26/23   Alvia Bring, DO  Cyanocobalamin  (B-12 PO) Take 1 tablet by mouth daily.    [provider]  escitalopram  (LEXAPRO ) 10 MG tablet TAKE 1 TABLET EVERY  DAY 06/20/23   Alvia Bring, DO  losartan -hydrochlorothiazide  (HYZAAR) 50-12.5 MG tablet TAKE 1 TABLET EVERY DAY 03/02/23   Alvia Bring, DO  metoprolol  succinate (TOPROL -XL) 25 MG 24 hr tablet Take 1 tablet (25 mg total) by mouth daily. 11/24/23   Fenton, Clint R, PA  Omega-3 Fatty Acids (FISH OIL) 1000 MG CAPS Take 1,000 mg by mouth daily.    [provider]    Allergies: Patient has no known allergies.    Review of Systems  Cardiovascular:  Positive for palpitations.    Updated Vital Signs  Vitals:   11/24/23 1453 11/24/23 1454  BP: 117/88   Pulse: 92   Resp: 18   Temp: (!) 97.3 F (36.3 C)   TempSrc: Oral   SpO2: 98%   Weight:  93.9 kg  Height:  6' 2 (1.88 m)     Physical Exam Vitals and nursing note reviewed.  HENT:     Head: Normocephalic.  Eyes:     Extraocular Movements: Extraocular movements intact.  Cardiovascular:     Rate and Rhythm: Normal rate and regular rhythm.     Pulses:          Radial pulses are 2+ on the right side and 2+ on the left side.     Heart sounds: Normal heart sounds.  Pulmonary:     Effort: Pulmonary effort is normal.     Breath sounds: Normal breath sounds.  Musculoskeletal:     Cervical back: Normal range of  motion.     Comments: Moves all extremities spontaneously without difficulty  Skin:    General: Skin is warm and dry.  Neurological:     Mental Status: He is alert and oriented to person, place, and time.     (all labs ordered are listed, but only abnormal results are displayed) Labs Reviewed  BASIC METABOLIC PANEL WITH GFR - Abnormal; Notable for the following components:      Result Value   CO2 21 (*)    Glucose, Bld 144 (*)    BUN 29 (*)    Creatinine, Ser 1.50 (*)    GFR, Estimated 50 (*)    All other components within normal limits  CBC  TROPONIN T, HIGH SENSITIVITY  TROPONIN T, HIGH SENSITIVITY    EKG: EKG Interpretation Date/Time:  Friday November 24 2023 14:56:12 EDT Ventricular Rate:   92 PR Interval:    QRS Duration:  127 QT Interval:  415 QTC Calculation: 494 R Axis:   80  Text Interpretation: Atrial flutter Right bundle branch block Confirmed by Darra Chew [phone]) on 11/24/2023 3:01:11 PM  Radiology: DG Chest 2 View Result Date: 11/24/2023 CLINICAL DATA:  Heart palpitations. History of cardioversion 1 week ago. EXAM: CHEST - 2 VIEW COMPARISON:  10/19/2023 FINDINGS: Lungs are adequately inflated without effusion, airspace consolidation or pneumothorax. Cardiomediastinal silhouette and remainder of the exam is unchanged. IMPRESSION: No active cardiopulmonary disease. Electronically Signed   By: Toribio Agreste M.D.   On: 11/24/2023 15:58     Procedures   Medications Ordered in the ED - No data to display                                  Medical Decision Making This patient presents to the ED for concern of palpitations, this involves an extensive number of treatment options, and is a complaint that carries with it a high risk of complications and morbidity.  The differential diagnosis includes atrial fibrillation, atrial flutter, other arrhythmia, ACS   Co morbidities that complicate the patient evaluation  History of atrial fibrillation/atrial flutter with recent cardioversion 9/5   Additional history obtained:  Additional history obtained from record review External records from outside source obtained and reviewed including most recent note from atrial fibrillation clinic   Lab Tests:  I Ordered, and personally interpreted labs.  The pertinent results include: CBC unremarkable.  BMP notable for creatinine of 1.5, this is elevated as compared to most recent baseline of 1.3 approximately 1 month ago.  Initial troponin <15.   Imaging Studies ordered:  I ordered imaging studies including chest x-ray I independently visualized and interpreted imaging which showed no active cardiopulmonary disease I agree with the radiologist interpretation   Cardiac  Monitoring: / EKG:  The patient was maintained on a cardiac monitor.  I personally viewed and interpreted the cardiac monitored which showed an underlying rhythm of: Atrial flutter   Problem List / ED Course / Critical interventions / Medication management I have reviewed the patients home medicines and have made adjustments as needed   Social Determinants of Health:  Former tobacco use   Test / Admission - Considered:  Physical exam is largely unremarkable as above.  Patient was felt to have a regular rate/rhythm upon my exam, however repeated EKG is notable for continued slow atrial flutter.  Initial troponin  <15, given that patient is not experience chest pain nor is he symptomatic from  his atrial flutter currently I do not feel that repeating his troponin is necessary at this time.  He does have a slight elevation in his creatinine as compared to his baseline, however this is not reflective of a true acute kidney injury, I discussed with him that this may be due to mild dehydration.  Patient is a well-appearing and in no acute distress, he is hemodynamically stable.  Given these reassuring findings I do not feel that additional intervention is warranted.  I discussed return precautions in depth with the patient today, he tells me that he has rescheduled his missed cardiology appointment and is planning to follow-up next week.  I advised him to keep this appointment and return to the emergency department if his symptoms worsen.  He is to continue Eliquis /metoprolol  as previously directed.  He voiced understanding and is in agreement with this plan.  He is appropriate for discharge at this time.   Staffed with Dr. Darra  Amount and/or Complexity of Data Reviewed Labs: ordered. Radiology: ordered.        Final diagnoses:  Atrial flutter, unspecified type [facility])    ED Discharge Orders     None          Glendia Rocky SAILOR, [place] 11/24/23 1745    Darra Fonda MATSU, MD 11/24/23  1751

## 2023-11-24 NOTE — ED Triage Notes (Signed)
 Pt reports cardioversion last Friday.  C/o feeling strange x 1 hour. Intermittent shob, denies at this time.

## 2023-11-30 ENCOUNTER — Telehealth: Payer: Self-pay | Admitting: Family Medicine

## 2023-11-30 NOTE — Telephone Encounter (Signed)
 [facility] cancellation

## 2023-12-05 ENCOUNTER — Ambulatory Visit: Admitting: Family Medicine

## 2023-12-05 ENCOUNTER — Ambulatory Visit (HOSPITAL_COMMUNITY)
Admission: RE | Admit: 2023-12-05 | Discharge: 2023-12-05 | Disposition: A | Discharge status: Home / Self Care | Source: Ambulatory Visit | Attending: Physician Assistant | Admitting: Physician Assistant

## 2023-12-05 VITALS — BP 117/75 | HR 84 | Ht 74.0 in | Wt 211.6 lb

## 2023-12-05 VITALS — BP 106/80 | HR 67 | Ht 74.0 in | Wt 211.6 lb

## 2023-12-05 DIAGNOSIS — M5442 Lumbago with sciatica, left side: Secondary | ICD-10-CM

## 2023-12-05 DIAGNOSIS — I4891 Unspecified atrial fibrillation: Secondary | ICD-10-CM

## 2023-12-05 DIAGNOSIS — I483 Typical atrial flutter: Secondary | ICD-10-CM | POA: Diagnosis not present

## 2023-12-05 DIAGNOSIS — I10 Essential (primary) hypertension: Secondary | ICD-10-CM | POA: Diagnosis not present

## 2023-12-05 DIAGNOSIS — R079 Chest pain, unspecified: Secondary | ICD-10-CM

## 2023-12-05 DIAGNOSIS — D6869 Other thrombophilia: Secondary | ICD-10-CM

## 2023-12-05 DIAGNOSIS — G8929 Other chronic pain: Secondary | ICD-10-CM

## 2023-12-05 DIAGNOSIS — R454 Irritability and anger: Secondary | ICD-10-CM | POA: Diagnosis not present

## 2023-12-05 MED ORDER — ESCITALOPRAM OXALATE 20 MG PO TABS: 20.0000 mg | ORAL_TABLET | Freq: Every day | ORAL | 1 refills | Status: AC

## 2023-12-05 MED ORDER — OXYCODONE HCL 5 MG PO TABS: 5.0000 mg | ORAL_TABLET | Freq: Four times a day (QID) | ORAL | 0 refills | Status: DC | PRN

## 2023-12-05 NOTE — Progress Notes (Signed)
 Primary Care Physician: Alvia Bring, DO Primary Cardiologist: None Electrophysiologist: None  Referring Physician: ED   William Wheeler is a 73 y.o. male with a history of HTN, CVA, previous alcohol abuse, diverticulitis, HLD, atrial fibrillation who presents for follow up in the [facility].  The patient was initially diagnosed with atrial flutter after presenting to the ED 10/19/23 with symptoms of palpitations and SOB. ECG showed atrial flutter with 2:1 and 3:1 block. Patient was started on Eliquis  for stroke prevention and metoprolol  for rate control. Patient is s/p DCCV on 11/10/23.   Patient returns for follow up for atrial flutter. Patient presented to the ED on 11/24/23 with palpitations, found to be back in atrial flutter. He remains in atrial flutter today. He has symptoms of SOB on exertion despite good rate control. He also reports having 3 episodes of chest pain, lasting 1-2 hours, described as a dull ache.   Today, he  denies symptoms of palpitations, orthopnea, PND, lower extremity edema, dizziness, presyncope, syncope, snoring, daytime somnolence, bleeding, or neurologic sequela. The patient is tolerating medications without difficulties and is otherwise without complaint today.    Atrial Fibrillation Risk Factors:  he does not have symptoms or diagnosis of sleep apnea. he does not have a history of rheumatic fever. he does not have a history of alcohol use.   Atrial Fibrillation Management history:  Previous antiarrhythmic drugs: none Previous cardioversions: 11/10/23 Previous ablations: none Anticoagulation history: Eliquis   ROS- All systems are reviewed and negative except as per the HPI above.  Past Medical History:  Diagnosis Date   Alcohol abuse    Alcohol addiction (HCC)    Arthritis    Colon polyps    Depression    Diverticulitis    Diverticulitis    History of colon polyps    History of hiatal hernia     Hypercholesteremia    Hypertension    Infectious colitis    Skin cancer    Stroke [facility])     Current Outpatient Medications  Medication Sig Dispense Refill   acetaminophen  (TYLENOL ) 325 MG tablet Take 650 mg by mouth every 6 (six) hours as needed for moderate pain (pain score 4-6). (Patient taking differently: 650 mg taking- 9 tablets daily total)     ammonium lactate  (AMLACTIN) 12 % cream APPLY 1 APPLICATION TOPICALLY AS NEEDED FOR DRY SKIN. 385 g 0   apixaban  (ELIQUIS ) 5 MG TABS tablet Take 1 tablet (5 mg total) by mouth 2 (two) times daily. 60 tablet 6   atorvastatin  (LIPITOR) 40 MG tablet TAKE 1 TABLET EVERY DAY 90 tablet 3   Cyanocobalamin  (B-12 PO) Take 1 tablet by mouth daily.     escitalopram  (LEXAPRO ) 10 MG tablet TAKE 1 TABLET EVERY DAY 90 tablet 3   losartan -hydrochlorothiazide  (HYZAAR) 50-12.5 MG tablet TAKE 1 TABLET EVERY DAY 90 tablet 3   metoprolol  succinate (TOPROL -XL) 25 MG 24 hr tablet Take 1 tablet (25 mg total) by mouth daily. 30 tablet 6   Omega-3 Fatty Acids (FISH OIL) 1000 MG CAPS Take 1,000 mg by mouth daily.     No current facility-administered medications for this encounter.    Physical Exam: BP 106/80   Pulse 67   Ht 6' 2 (1.88 m)   Wt 96 kg   BMI 27.17 kg/m   GEN: Well nourished, well developed in no acute distress CARDIAC: Irregularly irregular rate and rhythm, no murmurs, rubs, gallops RESPIRATORY:  Clear to auscultation without rales, wheezing  or rhonchi  ABDOMEN: Soft, non-tender, non-distended EXTREMITIES:  No edema; No deformity    Wt Readings from Last 3 Encounters:  12/05/23 96 kg  11/24/23 93.9 kg  11/02/23 97.3 kg     EKG today demonstrates  Typical atrial flutter with variable block Vent. rate 67 BPM PR interval * ms QRS duration 104 ms QT/QTcB 410/433 ms   Echo 07/16/19 demonstrated   1. Left ventricular ejection fraction, by estimation, is 55 to 60%. The  left ventricle has normal function. The left ventricle has no regional   wall motion abnormalities. Left ventricular diastolic parameters were  normal.   2. Right ventricular systolic function is normal. The right ventricular  size is normal. There is normal pulmonary artery systolic pressure.   3. The mitral valve is normal in structure. No evidence of mitral valve  regurgitation. No evidence of mitral stenosis.   4. The aortic valve is normal in structure. Aortic valve regurgitation is  mild. No aortic stenosis is present.   5. The inferior vena cava is normal in size with greater than 50%  respiratory variability, suggesting right atrial pressure of 3 mmHg.    CHA2DS2-VASc Score = 5  The patient's score is based upon: CHF History: 0 HTN History: 1 Diabetes History: 0 Stroke History: 2 Vascular Disease History: 1 Age Score: 1 Gender Score: 0       ASSESSMENT AND PLAN: Typical atrial flutter The patient's CHA2DS2-VASc score is 5, indicating a 7.2% annual risk of stroke.   S/p DCCV 11/10/23 with quick return of flutter. We discussed rhythm control options today. Will refer to EP to discuss ablation.  Continue Eliquis  5 mg BID Continue Toprol  25 mg daily  Secondary Hypercoagulable State (ICD10:  D68.69) The patient is at significant risk for stroke/thromboembolism based upon his CHA2DS2-VASc Score of 5.  Continue Apixaban  (Eliquis ). No bleeding issues.   HTN Stable on current regimen  CAD/aortic atherosclerosis Noted on CT 12/26/22 He is having dull, aching chest pain periodically. Does not appear related to exertion but could be with stress. He did smoke tobacco for 29 years.  Will order chest CTA to evaluate for ischemia/coronary disease.    Follow up with EP to discuss ablation.    [facility] [address] [place], [place] [address] [phone]

## 2023-12-05 NOTE — Progress Notes (Signed)
 William Wheeler - 73 y.o. male MRN [id number]  Date of birth: [birth year]  Subjective Chief Complaint  Patient presents with   Follow-up   Back Pain    HPI William Wheeler is a 73 y.o. male here today for follow-up visit.  He has been dealing with A-fib.  He did have cardioversion but ended up back in A-fib/flutter.  He has been followed by cardiology.  They are planning on doing ablation.  He remains on metoprolol  for rate control as well as Eliquis  for anticoagulation.  He does feel like he continues to get agitated easily.  He felt Lexapro  was helping initially but was not as effective at this point.  He is tolerating this well.  He is participating in some pastoral counseling.   He is having some low back pain with radiation into the left leg.  Denies weakness.  He does get some tingling sensation at times.  He has been taking arthritis strength Tylenol  3 tablets 3 times a day.  He does have some gabapentin  leftover from when he had shingles but has not tried this.  He is unable to utilize NSAIDs due to anticoagulant use.  Steroids have been ineffective for him previously.  Tramadol  has not worked well.  He has not done physical therapy.  ROS:  A comprehensive ROS was completed and negative except as noted per HPI  Past Medical History:  Diagnosis Date   Alcohol abuse    Alcohol addiction (HCC)    Arthritis    Colon polyps    Depression    Diverticulitis    Diverticulitis    History of colon polyps    History of hiatal hernia    Hypercholesteremia    Hypertension    Infectious colitis    Skin cancer    Stroke [facility])     Past Surgical History:  Procedure Laterality Date   broken bone repair     Nose   CARDIOVERSION N/A 11/10/2023   Procedure: CARDIOVERSION;  Surgeon: Shlomo Wilbert SAUNDERS, MD;  Location: [facility] INVASIVE CV LAB;  Service: Cardiovascular;  Laterality: N/A;   COLONOSCOPY     [facility] per patient about 15 years ago   FLEXIBLE SIGMOIDOSCOPY N/A 04/27/2023    Procedure: FLEXIBLE SIGMOIDOSCOPY;  Surgeon: Teresa Lonni HERO, MD;  Location: [facility] ORS;  Service: General;  Laterality: N/A;   HERNIA REPAIR     skin cancer removed  10/2021   forehead   TONSILLECTOMY     XI ROBOTIC ASSISTED LOWER ANTERIOR RESECTION N/A 04/27/2023   Procedure: XI ROBOTIC ASSISTED LOWER ANTERIOR RESECTION WITH COLORECTAL ANASTOMOSIS;  Surgeon: Teresa Lonni HERO, MD;  Location: [facility] ORS;  Service: General;  Laterality: N/A;    Social History   Socioeconomic History   Marital status: Married    Spouse name: William Wheeler   Number of children: 2   Years of education: some college   Highest education level: Some college, no degree  Occupational History   Occupation: Scientist, research (physical sciences)  Tobacco Use   Smoking status: Former    Current packs/day: 0.00    Average packs/day: 2.5 packs/day for 29.0 years (72.5 ttl pk-yrs)    Types: Cigarettes    Start date: 18    Quit date: 2001    Years since quitting: 24.7   Smokeless tobacco: Never  Vaping Use   Vaping status: Never Used  Substance and Sexual Activity   Alcohol use: Not Currently   Drug use: Yes    Types:  Marijuana    Comment: occasionally   Sexual activity: Not Currently    Partners: Female  Other Topics Concern   Not on file  Social History Narrative   Lives at home with his wife. 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    Social Drivers of Corporate investment banker Strain: Low Risk  (12/05/2023)   Overall Financial Resource Strain (CARDIA)    Difficulty of Paying Living Expenses: Not hard at all  Food Insecurity: No Food Insecurity (12/05/2023)   Hunger Vital Sign    Worried About Running Out of Food in the Last Year: Never true    Ran Out of Food in the Last Year: Never true  Transportation Needs: No Transportation Needs (12/05/2023)   PRAPARE - Administrator, Civil Service (Medical): No    Lack of Transportation (Non-Medical): No  Physical Activity: Inactive (12/05/2023)   Exercise  Vital Sign    Days of Exercise per Week: 0 days    Minutes of Exercise per Session: Not on file  Stress: Stress Concern Present (12/05/2023)   [employer] of Occupational Health - Occupational Stress Questionnaire    Feeling of Stress: Very much  Social Connections: Moderately Integrated (12/05/2023)   Social Connection and Isolation Panel    Frequency of Communication with Friends and Family: More than three times a week    Frequency of Social Gatherings with Friends and Family: Once a week    Attends Religious Services: More than 4 times per year    Active Member of Clubs or Organizations: No    Attends Engineer, structural: Not on file    Marital Status: Married    Family History  Problem Relation Age of Onset   Leukemia Mother    Non-Hodgkin's lymphoma Mother    Other Father        unsure of medical history   Breast cancer Sister    Colon cancer Neg Hx    Esophageal cancer Neg Hx     Health Maintenance  Topic Date Due   Hepatitis C Screening  Never done   DTaP/Tdap/Td (1 - Tdap) Never done   Pneumococcal Vaccine: 50+ Years (1 of 2 - PCV) Never done   Zoster Vaccines- Shingrix (2 of 2) 10/12/2019   Influenza Vaccine  10/06/2023   COVID-19 Vaccine (6 - 2025-26 season) 11/06/2023   Medicare Annual Wellness (AWV)  07/19/2024   Colonoscopy  03/02/2026   HPV VACCINES  Aged Out   Meningococcal B Vaccine  Aged Out   Fecal DNA (Cologuard)  Discontinued     ----------------------------------------------------------------------------------------------------------------------------------------------------------------------------------------------------------------- Physical Exam BP 117/75 (BP Location: Left Arm, Patient Position: Sitting, Cuff Size: Normal)   Pulse 84   Ht 6' 2 (1.88 m)   Wt 211 lb 9.6 oz (96 kg)   SpO2 97%   BMI 27.17 kg/m   Physical Exam Constitutional:      Appearance: Normal appearance.  Eyes:     General: No scleral  icterus. Cardiovascular:     Rate and Rhythm: Normal rate and regular rhythm.  Pulmonary:     Effort: Pulmonary effort is normal.     Breath sounds: Normal breath sounds.  Neurological:     Mental Status: He is alert.  Psychiatric:        Mood and Affect: Mood normal.        Behavior: Behavior normal.     ------------------------------------------------------------------------------------------------------------------------------------------------------------------------------------------------------------------- Assessment and Plan  Essential hypertension BP remains well controlled.  Continue losartan /hctz at current strength.  Chronic low back pain Significant improvement with steroids or tramadol .  Adding physical therapy and if not improving with this we will plan on MRI for interventional planning.  We discussed limiting his Tylenol  to no more than 3000 mg/day.  He does have gabapentin  at home which I encouraged him to try.  Did provide a prescription for short-term oxycodone  due to more severe pain.   Irritability He has had increased irritability.  Increasing Lexapro  to 20 mg daily.  He will continue with counseling.   Meds ordered this encounter  Medications   escitalopram  (LEXAPRO ) 20 MG tablet    Sig: Take 1 tablet (20 mg total) by mouth daily.    Dispense:  90 tablet    Refill:  1   oxyCODONE  (ROXICODONE ) 5 MG immediate release tablet    Sig: Take 1 tablet (5 mg total) by mouth every 6 (six) hours as needed for severe pain (pain score 7-10).    Dispense:  15 tablet    Refill:  0    Return in about 8 weeks (around 01/30/2024) for f/u back pain and mood.

## 2023-12-05 NOTE — Assessment & Plan Note (Signed)
BP remains well controlled.  Continue losartan/hctz at current strength.   

## 2023-12-05 NOTE — Assessment & Plan Note (Signed)
 Significant improvement with steroids or tramadol .  Adding physical therapy and if not improving with this we will plan on MRI for interventional planning.  We discussed limiting his Tylenol  to no more than 3000 mg/day.  He does have gabapentin  at home which I encouraged him to try.  Did provide a prescription for short-term oxycodone  due to more severe pain.

## 2023-12-05 NOTE — Patient Instructions (Signed)
 Increase escitalopram  to 20mg  daily.  No more than 3000mg  of tylenol  per day.  Try gabapentin  300mg  at night first.  You may increase this to three times per day.  Oxycodone  sparingly for severe pain.

## 2023-12-05 NOTE — Patient Instructions (Signed)
 CT scheduling [phone]

## 2023-12-05 NOTE — Assessment & Plan Note (Signed)
 He has had increased irritability.  Increasing Lexapro  to 20 mg daily.  He will continue with counseling.

## 2023-12-06 LAB — CBC WITH DIFFERENTIAL/PLATELET
Basophils Absolute: 0.1 x10E3/uL (ref 0.0–0.2)
Basos: 1 %
EOS (ABSOLUTE): 0.1 x10E3/uL (ref 0.0–0.4)
Eos: 1 %
Hematocrit: 40.6 % (ref 37.5–51.0)
Hemoglobin: 13.4 g/dL (ref 13.0–17.7)
Immature Grans (Abs): 0 x10E3/uL (ref 0.0–0.1)
Immature Granulocytes: 0 %
Lymphocytes Absolute: 2.9 x10E3/uL (ref 0.7–3.1)
Lymphs: 33 %
MCH: 30 pg (ref 26.6–33.0)
MCHC: 33 g/dL (ref 31.5–35.7)
MCV: 91 fL (ref 79–97)
Monocytes Absolute: 0.8 x10E3/uL (ref 0.1–0.9)
Monocytes: 8 %
Neutrophils Absolute: 5.1 x10E3/uL (ref 1.4–7.0)
Neutrophils: 57 %
Platelets: 255 x10E3/uL (ref 150–450)
RBC: 4.46 x10E6/uL (ref 4.14–5.80)
RDW: 14.9 % (ref 11.6–15.4)
WBC: 9 x10E3/uL (ref 3.4–10.8)

## 2023-12-06 LAB — CMP14+EGFR
ALT: 14 IU/L (ref 0–44)
AST: 17 IU/L (ref 0–40)
Albumin: 4.5 g/dL (ref 3.9–4.9)
Alkaline Phosphatase: 35 IU/L — ABNORMAL LOW (ref 47–123)
BUN/Creatinine Ratio: 23 (ref 10–24)
BUN: 29 mg/dL — ABNORMAL HIGH (ref 8–27)
Bilirubin Total: 0.5 mg/dL (ref 0.0–1.2)
CO2: 20 mmol/L (ref 20–29)
Calcium: 9.8 mg/dL (ref 8.6–10.2)
Chloride: 104 mmol/L (ref 96–106)
Creatinine, Ser: 1.28 mg/dL — ABNORMAL HIGH (ref 0.76–1.27)
Globulin, Total: 2.6 g/dL (ref 1.5–4.5)
Glucose: 88 mg/dL (ref 70–99)
Potassium: 4 mmol/L (ref 3.5–5.2)
Sodium: 140 mmol/L (ref 134–144)
Total Protein: 7.1 g/dL (ref 6.0–8.5)
eGFR: 60 mL/min/1.73 (ref >59)

## 2023-12-06 LAB — SPECIMEN STATUS REPORT

## 2023-12-07 ENCOUNTER — Ambulatory Visit: Payer: Self-pay | Admitting: Family Medicine

## 2023-12-08 ENCOUNTER — Other Ambulatory Visit (HOSPITAL_COMMUNITY): Payer: Self-pay

## 2023-12-13 ENCOUNTER — Telehealth (HOSPITAL_COMMUNITY): Payer: Self-pay | Admitting: Emergency Medicine

## 2023-12-13 NOTE — Telephone Encounter (Signed)
 Attempted to call patient regarding upcoming cardiac CT appointment. Left message on voicemail with name and callback number Rockwell Alexandria RN Navigator Cardiac Imaging [facility] [phone] Office [phone] Cell

## 2023-12-14 ENCOUNTER — Ambulatory Visit (HOSPITAL_COMMUNITY)
Admission: RE | Admit: 2023-12-14 | Discharge: 2023-12-14 | Disposition: A | Discharge status: Home / Self Care | Source: Ambulatory Visit | Attending: Cardiology | Admitting: Cardiology

## 2023-12-14 DIAGNOSIS — R079 Chest pain, unspecified: Secondary | ICD-10-CM | POA: Insufficient documentation

## 2023-12-14 DIAGNOSIS — Q2545 Double aortic arch: Secondary | ICD-10-CM | POA: Diagnosis not present

## 2023-12-14 DIAGNOSIS — I700 Atherosclerosis of aorta: Secondary | ICD-10-CM | POA: Insufficient documentation

## 2023-12-14 DIAGNOSIS — R931 Abnormal findings on diagnostic imaging of heart and coronary circulation: Secondary | ICD-10-CM | POA: Insufficient documentation

## 2023-12-14 DIAGNOSIS — I2510 Atherosclerotic heart disease of native coronary artery without angina pectoris: Secondary | ICD-10-CM | POA: Insufficient documentation

## 2023-12-14 DIAGNOSIS — I2584 Coronary atherosclerosis due to calcified coronary lesion: Secondary | ICD-10-CM | POA: Insufficient documentation

## 2023-12-14 DIAGNOSIS — I77810 Thoracic aortic ectasia: Secondary | ICD-10-CM | POA: Diagnosis not present

## 2023-12-14 MED ORDER — NITROGLYCERIN 0.4 MG SL SUBL
0.8000 mg | SUBLINGUAL_TABLET | Freq: Once | SUBLINGUAL | Status: AC
  Administered 2023-12-14: 0.8 mg via SUBLINGUAL

## 2023-12-14 MED ORDER — IOHEXOL 350 MG/ML SOLN
100.0000 mL | Freq: Once | INTRAVENOUS | Status: AC | PRN
  Administered 2023-12-14: 100 mL via INTRAVENOUS

## 2023-12-15 ENCOUNTER — Other Ambulatory Visit: Payer: Self-pay | Admitting: Student in an Organized Health Care Education/Training Program

## 2023-12-15 ENCOUNTER — Other Ambulatory Visit: Payer: Self-pay | Admitting: Family Medicine

## 2023-12-15 ENCOUNTER — Encounter: Payer: Self-pay | Admitting: Family Medicine

## 2023-12-15 ENCOUNTER — Ambulatory Visit (HOSPITAL_BASED_OUTPATIENT_CLINIC_OR_DEPARTMENT_OTHER)
Admission: RE | Admit: 2023-12-15 | Discharge: 2023-12-15 | Disposition: A | Discharge status: Home / Self Care | Source: Ambulatory Visit | Attending: Student in an Organized Health Care Education/Training Program | Admitting: Student in an Organized Health Care Education/Training Program

## 2023-12-15 DIAGNOSIS — I2510 Atherosclerotic heart disease of native coronary artery without angina pectoris: Secondary | ICD-10-CM | POA: Diagnosis not present

## 2023-12-15 DIAGNOSIS — R079 Chest pain, unspecified: Secondary | ICD-10-CM | POA: Diagnosis not present

## 2023-12-15 DIAGNOSIS — I2584 Coronary atherosclerosis due to calcified coronary lesion: Secondary | ICD-10-CM | POA: Diagnosis not present

## 2023-12-15 DIAGNOSIS — R931 Abnormal findings on diagnostic imaging of heart and coronary circulation: Secondary | ICD-10-CM | POA: Diagnosis not present

## 2023-12-15 DIAGNOSIS — I77810 Thoracic aortic ectasia: Secondary | ICD-10-CM | POA: Diagnosis not present

## 2023-12-15 MED ORDER — OXYCODONE HCL 5 MG PO TABS: 5.0000 mg | ORAL_TABLET | Freq: Four times a day (QID) | ORAL | 0 refills | Status: DC | PRN

## 2023-12-15 MED ORDER — GABAPENTIN 300 MG PO CAPS: ORAL_CAPSULE | ORAL | 3 refills | Status: DC

## 2023-12-15 NOTE — Progress Notes (Signed)
CT FFR ordered.  

## 2023-12-18 ENCOUNTER — Ambulatory Visit (HOSPITAL_COMMUNITY): Payer: Self-pay | Admitting: Physician Assistant

## 2023-12-18 DIAGNOSIS — R079 Chest pain, unspecified: Secondary | ICD-10-CM

## 2023-12-18 DIAGNOSIS — R931 Abnormal findings on diagnostic imaging of heart and coronary circulation: Secondary | ICD-10-CM

## 2023-12-27 ENCOUNTER — Telehealth: Payer: Self-pay

## 2023-12-27 NOTE — Progress Notes (Unsigned)
 CARDIOLOGY CONSULT NOTE       Patient ID: Karder Goodin III MRN: [id number] DOB/AGE: Feb 25, 1951 73 y.o.  Admit date: (Not on file) Referring Physician: Nellene Primary Physician: Alvia Bring, DO Primary Cardiologist: None Reason for Consultation: chest pain   HPI:  73 y.o. referred by afib clinic [facility] for chest pain. History of HTN, CVA, previous ETOH abuse and PAF. Lst seen by PA 11/02/23 CH[facility]VASC 4 started on eliquis  for his flutter. Plan for [facility] after 3 weeks of anticoagulation Successful DCC by Dr Shlomo 11/10/23 Uses Toprol  25 mg for rate control. Coronary CT done 12/14/23 read by Dr Floretta CAD RAD 4 with postentially 70-95 lesions in D1, RCA and LAD Calcium  score was 2097 with TPV 1671 CT FFR was only positive in distal LAD but proximal FFR noted to be reduced at 0.77. Last echo in 2021 had normal EF.  He has some agitation. Rx with Lexapro  Chronic lower back pain limits activity  ECG today shows back in flutter with controlled rate 58 bpm   He has SSCP that is not always exertional. He has no nitroglycerin. No resting pain   He runs two dry cleaners. Seems to have some memory/cognitive decline. Not clear if this is related to prior ETOH abuse  ROS All other systems reviewed and negative except as noted above  Past Medical History:  Diagnosis Date   Alcohol abuse    Alcohol addiction (HCC)    Arthritis    Colon polyps    Depression    Diverticulitis    Diverticulitis    History of colon polyps    History of hiatal hernia    Hypercholesteremia    Hypertension    Infectious colitis    Skin cancer    Stroke (HCC)     Family History  Problem Relation Age of Onset   Leukemia Mother    Non-Hodgkin's lymphoma Mother    Other Father        unsure of medical history   Breast cancer Sister    Colon cancer Neg Hx    Esophageal cancer Neg Hx     Social History   Socioeconomic History   Marital status: Married    Spouse name: Bodee Lafoe   Number of  children: 2   Years of education: some college   Highest education level: Some college, no degree  Occupational History   Occupation: Scientist, research (physical sciences)  Tobacco Use   Smoking status: Former    Current packs/day: 0.00    Average packs/day: 2.5 packs/day for 29.0 years (72.5 ttl pk-yrs)    Types: Cigarettes    Start date: 76    Quit date: 2001    Years since quitting: 24.8   Smokeless tobacco: Never  Vaping Use   Vaping status: Never Used  Substance and Sexual Activity   Alcohol use: Not Currently   Drug use: Yes    Types: Marijuana    Comment: occasionally   Sexual activity: Not Currently    Partners: Female  Other Topics Concern   Not on file  Social History Narrative   Lives at home with his wife. 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    Social Drivers of Corporate investment banker Strain: Low Risk  (12/05/2023)   Overall Financial Resource Strain (CARDIA)    Difficulty of Paying Living Expenses: Not hard at all  Food Insecurity: No Food Insecurity (12/05/2023)   Hunger Vital Sign    Worried About Running Out of Food in  the Last Year: Never true    Ran Out of Food in the Last Year: Never true  Transportation Needs: No Transportation Needs (12/05/2023)   PRAPARE - Administrator, Civil Service (Medical): No    Lack of Transportation (Non-Medical): No  Physical Activity: Inactive (12/05/2023)   Exercise Vital Sign    Days of Exercise per Week: 0 days    Minutes of Exercise per Session: Not on file  Stress: Stress Concern Present (12/05/2023)   [employer] of Occupational Health - Occupational Stress Questionnaire    Feeling of Stress: Very much  Social Connections: Moderately Integrated (12/05/2023)   Social Connection and Isolation Panel    Frequency of Communication with Friends and Family: More than three times a week    Frequency of Social Gatherings with Friends and Family: Once a week    Attends Religious Services: More than 4 times per year     Active Member of [employer] or Organizations: No    Attends Banker Meetings: Not on file    Marital Status: Married  Intimate Partner Violence: Not At Risk (07/20/2023)   Humiliation, Afraid, Rape, and Kick questionnaire    Fear of Current or Ex-Partner: No    Emotionally Abused: No    Physically Abused: No    Sexually Abused: No    Past Surgical History:  Procedure Laterality Date   broken bone repair     Nose   CARDIOVERSION N/A 11/10/2023   Procedure: CARDIOVERSION;  Surgeon: Shlomo Wilbert SAUNDERS, MD;  Location: [facility];  Service: Cardiovascular;  Laterality: N/A;   COLONOSCOPY     [facility] per patient about 15 years ago   FLEXIBLE SIGMOIDOSCOPY N/A 04/27/2023   Procedure: FLEXIBLE SIGMOIDOSCOPY;  Surgeon: Teresa Lonni HERO, MD;  Location: [facility];  Service: General;  Laterality: N/A;   HERNIA REPAIR     skin cancer removed  10/2021   forehead   TONSILLECTOMY     XI ROBOTIC ASSISTED LOWER ANTERIOR RESECTION N/A 04/27/2023   Procedure: XI ROBOTIC ASSISTED LOWER ANTERIOR RESECTION WITH COLORECTAL ANASTOMOSIS;  Surgeon: Teresa Lonni HERO, MD;  Location: [facility];  Service: General;  Laterality: N/A;      Current Outpatient Medications:    acetaminophen  (TYLENOL ) 325 MG tablet, Take 650 mg by mouth every 6 (six) hours as needed for moderate pain (pain score 4-6). (Patient taking differently: 650 mg taking- 9 tablets daily total), Disp: , Rfl:    ammonium lactate  (AMLACTIN) 12 % cream, APPLY 1 APPLICATION TOPICALLY AS NEEDED FOR DRY SKIN., Disp: 385 g, Rfl: 0   apixaban  (ELIQUIS ) 5 MG TABS tablet, Take 1 tablet (5 mg total) by mouth 2 (two) times daily., Disp: 60 tablet, Rfl: 6   atorvastatin  (LIPITOR) 40 MG tablet, TAKE 1 TABLET EVERY DAY, Disp: 90 tablet, Rfl: 3   Cyanocobalamin  (B-12 PO), Take 1 tablet by mouth daily., Disp: , Rfl:    escitalopram  (LEXAPRO ) 20 MG tablet, Take 1 tablet (20 mg total) by mouth daily., Disp: 90 tablet, Rfl: 1   gabapentin  (NEURONTIN ) 300 MG  capsule, One tab PO qHS for a week, then BID for a week, then TID. May double to 600mg  TID after an additional week if needed., Disp: 90 capsule, Rfl: 3   losartan -hydrochlorothiazide  (HYZAAR) 50-12.5 MG tablet, TAKE 1 TABLET EVERY DAY, Disp: 90 tablet, Rfl: 3   metoprolol  succinate (TOPROL -XL) 25 MG 24 hr tablet, Take 1 tablet (25 mg total) by mouth daily., Disp: 30 tablet, Rfl:  6   Omega-3 Fatty Acids (FISH OIL) 1000 MG CAPS, Take 1,000 mg by mouth daily., Disp: , Rfl:    oxyCODONE  (ROXICODONE ) 5 MG immediate release tablet, Take 1 tablet (5 mg total) by mouth every 6 (six) hours as needed for severe pain (pain score 7-10)., Disp: 15 tablet, Rfl: 0    Physical Exam: Blood pressure 108/68, pulse (!) 107, resp. rate 16, height 6' 2 (1.88 m), weight 213 lb 12.8 oz (97 kg), SpO2 96%.   Affect appropriate Healthy:  appears stated age HEENT: normal Neck supple with no adenopathy JVP normal no bruits no thyromegaly Lungs clear with no wheezing and good diaphragmatic motion Heart:  S1/S2 no murmur, no rub, gallop or click PMI normal Abdomen: benighn, BS positve, no tenderness, no AAA no bruit.  No HSM or HJR Distal pulses intact with no bruits No edema Neuro non-focal Skin warm and dry No muscular weakness   Labs:   Lab Results  Component Value Date   WBC 9.0 12/05/2023   HGB 13.4 12/05/2023   HCT 40.6 12/05/2023   MCV 91 12/05/2023   PLT 255 12/05/2023   No results for input(s): NA, K, CL, CO2, BUN, CREATININE, CALCIUM , PROT, BILITOT, ALKPHOS, ALT, AST, GLUCOSE in the last 168 hours.  Invalid input(s): LABALBU No results found for: CKTOTAL, CKMB, CKMBINDEX, TROPONINI  Lab Results  Component Value Date   CHOL 142 04/02/2021   CHOL 129 10/08/2019   CHOL 206 (H) 11/08/2018   Lab Results  Component Value Date   HDL 45 04/02/2021   HDL 47 10/08/2019   HDL 45 11/08/2018   Lab Results  Component Value Date   LDLCALC 83 04/02/2021    LDLCALC 68 10/08/2019   LDLCALC 139 (H) 11/08/2018   Lab Results  Component Value Date   TRIG 62 04/02/2021   TRIG 61 10/08/2019   TRIG 107 11/08/2018   Lab Results  Component Value Date   CHOLHDL 3.2 04/02/2021   CHOLHDL 2.7 10/08/2019   CHOLHDL 4.6 11/08/2018   No results found for: LDLDIRECT    Radiology: CT CORONARY MORPH W/CTA COR W/SCORE W/CA W/CM &/OR WO/CM Addendum Date: 12/24/2023 ADDENDUM REPORT: 12/24/2023 10:40 EXAM: OVER-READ INTERPRETATION  CT CHEST The following report is an over-read performed by radiologist Dr. Andrea Gasman of [facility] on 12/24/2023. This over-read does not include interpretation of cardiac or coronary anatomy or pathology. The coronary CTA interpretation by the cardiologist is attached. COMPARISON:  None. FINDINGS: Vascular: Minor aortic atherosclerosis. The ascending aorta is normal in caliber. Mediastinum/nodes: No adenopathy or mass. Unremarkable esophagus. Lungs: No focal airspace disease. No pulmonary nodule. No pleural fluid. Bronchial thickening. Upper abdomen: No acute or unexpected findings. Musculoskeletal: There are no acute or suspicious osseous abnormalities. IMPRESSION: 1. Bronchial thickening. 2.  Aortic Atherosclerosis (ICD10-I70.0). Electronically Signed   By: Andrea Gasman M.D.   On: 12/24/2023 10:40   Result Date: 12/24/2023 HISTORY: Chest pain (Ped 0-17y) EXAM: Cardiac/Coronary CT TECHNIQUE: The patient was scanned on a [employer]. PROTOCOL: A 120 kV prospective scan was triggered in the descending thoracic aorta at 111 HU's. Axial non-contrast 3 mm slices were carried out through the heart. The data set was analyzed on a dedicated work station and scored using the Agatston method. Gantry rotation speed was 250 msecs and collimation was 0.6 mm. Heart rate was optimized medically and sl NTG was given. The 3D data set was reconstructed in 5% intervals of the 35-75 % of the R-R cycle. Systolic  and diastolic  phases were analyzed on a dedicated work station using MPR, MIP and VRT modes. The patient received 100mL OMNIPAQUE  IOHEXOL  350 MG/ML SOLN of contrast. FINDINGS: Coronary calcium  score: The patient's coronary artery calcium  score is 2097, which places the patient in the 94th percentile. Coronary arteries: Normal coronary origins.  Right dominance. Left Main: Normal caliber vessel. The distal LM has a small focus of calcified plaque that is contiguous with plaque in the proximal LAD resulting in <25% stenosis. Left Anterior Descending: Normal caliber vessel. The proximal and mid-LAD have predominantly calcified plaque most prominent in the proximal LAD resulting in 50-69% stenosis. Diagonal branches: There are 3 small caliber diagonal branches, but the distal 2nd and 3rd diagonals are too small to characterize. D1 has a focus of calcified plaque in the mid-distal segment with 70-99% stenosis. Evaluation of the D1 lesion is limited by blooming artifact. Left Circumflex Artery: Normal caliber vessel. The proximal and mid LCx have scattered calcified plaques most notably in the proximal LCx resulting in 25-49% stenosis. Obtuse Marginal branches: Assessment of the OM branches is severely limited by motion artifact. Right Coronary Artery: Normal caliber vessel, gives rise to PDA. There is scattered calcified and non-calcified plaques throughout the RCA with a predominance of calcified plaque. The most notable plaque is calcified in the proximal RCA resulting in 70-99% stenosis. Assessment of the severity of stenosis is limited by blooming artifact. EXTRA-CORONARY FINDINGS: EXTRA-CORONARY FINDINGS Aorta: Normal size, 37 x 35 mm at the mid ascending aorta (level of the PA bifurcation) measured double oblique. The aortic root is mildly enlarged measuring 41 x 41 x 41 mm. There is minimal calcifications in the descending aorta. No dissection seen in visualized portions of the aorta. Normal size of the pulmonary artery Aortic  Valve: Trileaflet without calcifications. Normal pulmonary vein drainage into the left atrium. Normal left atrial appendage without a thrombus. Normal appearance of the pericardium. Non-cardiac findings will be assessed and reported in a separate document by radiology. IMPRESSION: 1. Multivessel, predominantly calcified, coronary artery disease is present in all 3 major epicardial coronary arteries. The most notable disease is present in the LAD (50-69% stenosis), first diagonal (70-99% stenosis) and RCA (70-99% stenosis). CADRADS = 4. CT FFR will be performed and reported on separately. 2. Coronary calcium  score of 2097. This was 94th percentile for age-, sex-, and race- matched controls. 3. Total plaque volume 1671 mm3. 4. Normal coronary origin with right dominance. 5. Mild dilation of the aortic root measuring 41 x 41 x 41 mm measured cusp to commissure INTERPRETATION: 1. CAD-RADS 0: No evidence of CAD (0%). Consider non-atherosclerotic causes of chest pain. 2. CAD-RADS 1: Minimal non-obstructive CAD (1-24%). Consider non-atherosclerotic causes of chest pain. Consider preventive therapy and risk factor modification. 3. CAD-RADS 2: Mild non-obstructive CAD (25-49%). Consider non-atherosclerotic causes of chest pain. Consider preventive therapy and risk factor modification. 4. CAD-RADS 3: Moderate stenosis (50-69%). Consider symptom-guided anti-ischemic pharmacotherapy as well as risk factor modification per guideline directed care. Additional analysis with CT FFR will be submitted. 5. CAD-RADS 4: Severe stenosis. (70-99% or > 50% left main). Cardiac catheterization or CT FFR is recommended. Consider symptom-guided anti-ischemic pharmacotherapy as well as risk factor modification per guideline directed care. Invasive coronary angiography recommended with revascularization per published guideline statements. 6. CAD-RADS 5: Total coronary occlusion (100%). Consider cardiac catheterization or viability assessment.  Consider symptom-guided anti-ischemic pharmacotherapy as well as risk factor modification per guideline directed care. 7. CAD-RADS N: Non-diagnostic study. Obstructive CAD can't be  excluded. Alternative evaluation is recommended. Electronically Signed: By: Georganna Archer On: 12/15/2023 13:15   CT CORONARY FRACTIONAL FLOW RESERVE FLUID ANALYSIS Result Date: 12/15/2023 EXAM: CT FFR ANALYSIS CLINICAL DATA:  chest pain FINDINGS: FFRct analysis was performed on the original cardiac CT angiogram dataset. Diagrammatic representation of the FFRct analysis is provided in a separate PDF document in PACS. This dictation was created using the PDF document and an interactive 3D model of the results. 3D model is not available in the EMR/PACS. Normal FFR range is >0.80. 1. LAD: Borderline flow limiting stenosis in the proximal and mid LAD. Potentially flow limiting stenosis in the distal LAD. Proximal FFR = 0.8 , Mid FFR = 0.75 , Distal FFR =0.68 2. Diagonal: Borderline flow limiting disease in D1. Proximal D1 FFR = 0.80. 3. LCX: No significant stenosis. Proximal FFR = 0.95 , Distal FFR = 0.87 4. RCA: No significant stenosis. Proximal FFR = 0.77 , Mid FFR = 0.77, distal FFR = 0.76 IMPRESSION: 1. CT FFR analysis demonstrates flow limiting stenosis in the distal LAD which may be overestimated given the small vessel caliber distally. 2. CT FFR analysis demonstrates borderline FFR values and thus potentially flow limiting lesions in the LAD and RCA. 3. Given the patient's elevated CAC score and symptoms, left heart catheterization is recommended for further evaluation. Electronically Signed   By: Georganna Archer   On: 12/15/2023 13:24    EKG: flutter rate 67 normal ST segments 12/05/23   ASSESSMENT AND PLAN:   Chest Pain: with very high calcium  score and abnormal FFR CT in distal LAD and throughout RCA. Shared decisions making favor cath to fully understand his dx. Continue Lipitor and Toprol  Will need to hold eliquis  2  days before He is about 7 weeks post DCC. Check labs today  PAF:  flutter Post [facility] 11/10/23 on Eliquis  and Toprol  F/U with [facility]/EP would arrange for flutter ablation after cath and coronary arteries taken care of  HTN:  continue Hyzaar Agitation:  prior ETOH abuse on Lexapro  f/u primary  Pre cath labs Orders written  F/U post cath  SL nitroglycerin called in   Signed: Maude Emmer 12/28/2023, 9:54 AM

## 2023-12-27 NOTE — H&P (View-Only) (Signed)
 CARDIOLOGY CONSULT NOTE       Patient ID: William Wheeler MRN: [id number] DOB/AGE: Oct 19, 1950 73 y.o.  Admit date: (Not on file) Referring Physician: Nellene Primary Physician: Alvia Bring, DO Primary Cardiologist: None Reason for Consultation: chest pain   HPI:  73 y.o. referred by afib clinic [facility] for chest pain. History of HTN, CVA, previous ETOH abuse and PAF. Lst seen by PA 11/02/23 CHADVASC 4 started on eliquis  for his flutter. Plan for [facility] after 3 weeks of anticoagulation Successful DCC by Dr Shlomo 11/10/23 Uses Toprol  25 mg for rate control. Coronary CT done 12/14/23 read by Dr Floretta CAD RAD 4 with postentially 70-95 lesions in D1, RCA and LAD Calcium  score was 2097 with TPV 1671 CT FFR was only positive in distal LAD but proximal FFR noted to be reduced at 0.77. Last echo in 2021 had normal EF.  He has some agitation. Rx with Lexapro  Chronic lower back pain limits activity  ECG today shows back in flutter with controlled rate 58 bpm   He has SSCP that is not always exertional. He has no nitroglycerin. No resting pain   He runs two dry cleaners. Seems to have some memory/cognitive decline. Not clear if this is related to prior ETOH abuse  ROS All other systems reviewed and negative except as noted above  Past Medical History:  Diagnosis Date   Alcohol abuse    Alcohol addiction (HCC)    Arthritis    Colon polyps    Depression    Diverticulitis    Diverticulitis    History of colon polyps    History of hiatal hernia    Hypercholesteremia    Hypertension    Infectious colitis    Skin cancer    Stroke (HCC)     Family History  Problem Relation Age of Onset   Leukemia Mother    Non-Hodgkin's lymphoma Mother    Other Father        unsure of medical history   Breast cancer Sister    Colon cancer Neg Hx    Esophageal cancer Neg Hx     Social History   Socioeconomic History   Marital status: Married    Spouse name: William Wheeler   Number of  children: 2   Years of education: some college   Highest education level: Some college, no degree  Occupational History   Occupation: scientist, research (physical sciences)  Tobacco Use   Smoking status: Former    Current packs/day: 0.00    Average packs/day: 2.5 packs/day for 29.0 years (72.5 ttl pk-yrs)    Types: Cigarettes    Start date: 61    Quit date: 2001    Years since quitting: 24.8   Smokeless tobacco: Never  Vaping Use   Vaping status: Never Used  Substance and Sexual Activity   Alcohol use: Not Currently   Drug use: Yes    Types: Marijuana    Comment: occasionally   Sexual activity: Not Currently    Partners: Female  Other Topics Concern   Not on file  Social History Narrative   Lives at home with his wife. 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    Social Drivers of Corporate Investment Banker Strain: Low Risk  (12/05/2023)   Overall Financial Resource Strain (CARDIA)    Difficulty of Paying Living Expenses: Not hard at all  Food Insecurity: No Food Insecurity (12/05/2023)   Hunger Vital Sign    Worried About Running Out of Food in  the Last Year: Never true    Ran Out of Food in the Last Year: Never true  Transportation Needs: No Transportation Needs (12/05/2023)   PRAPARE - Administrator, Civil Service (Medical): No    Lack of Transportation (Non-Medical): No  Physical Activity: Inactive (12/05/2023)   Exercise Vital Sign    Days of Exercise per Week: 0 days    Minutes of Exercise per Session: Not on file  Stress: Stress Concern Present (12/05/2023)   [employer] of Occupational Health - Occupational Stress Questionnaire    Feeling of Stress: Very much  Social Connections: Moderately Integrated (12/05/2023)   Social Connection and Isolation Panel    Frequency of Communication with Friends and Family: More than three times a week    Frequency of Social Gatherings with Friends and Family: Once a week    Attends Religious Services: More than 4 times per year     Active Member of [employer] or Organizations: No    Attends Banker Meetings: Not on file    Marital Status: Married  Intimate Partner Violence: Not At Risk (07/20/2023)   Humiliation, Afraid, Rape, and Kick questionnaire    Fear of Current or Ex-Partner: No    Emotionally Abused: No    Physically Abused: No    Sexually Abused: No    Past Surgical History:  Procedure Laterality Date   broken bone repair     Nose   CARDIOVERSION N/A 11/10/2023   Procedure: CARDIOVERSION;  Surgeon: Shlomo Wilbert SAUNDERS, MD;  Location: [facility];  Service: Cardiovascular;  Laterality: N/A;   COLONOSCOPY     [facility] per patient about 15 years ago   FLEXIBLE SIGMOIDOSCOPY N/A 04/27/2023   Procedure: FLEXIBLE SIGMOIDOSCOPY;  Surgeon: Teresa Lonni HERO, MD;  Location: [facility];  Service: General;  Laterality: N/A;   HERNIA REPAIR     skin cancer removed  10/2021   forehead   TONSILLECTOMY     XI ROBOTIC ASSISTED LOWER ANTERIOR RESECTION N/A 04/27/2023   Procedure: XI ROBOTIC ASSISTED LOWER ANTERIOR RESECTION WITH COLORECTAL ANASTOMOSIS;  Surgeon: Teresa Lonni HERO, MD;  Location: [facility];  Service: General;  Laterality: N/A;      Current Outpatient Medications:    acetaminophen  (TYLENOL ) 325 MG tablet, Take 650 mg by mouth every 6 (six) hours as needed for moderate pain (pain score 4-6). (Patient taking differently: 650 mg taking- 9 tablets daily total), Disp: , Rfl:    ammonium lactate  (AMLACTIN) 12 % cream, APPLY 1 APPLICATION TOPICALLY AS NEEDED FOR DRY SKIN., Disp: 385 g, Rfl: 0   apixaban  (ELIQUIS ) 5 MG TABS tablet, Take 1 tablet (5 mg total) by mouth 2 (two) times daily., Disp: 60 tablet, Rfl: 6   atorvastatin  (LIPITOR) 40 MG tablet, TAKE 1 TABLET EVERY DAY, Disp: 90 tablet, Rfl: 3   Cyanocobalamin  (B-12 PO), Take 1 tablet by mouth daily., Disp: , Rfl:    escitalopram  (LEXAPRO ) 20 MG tablet, Take 1 tablet (20 mg total) by mouth daily., Disp: 90 tablet, Rfl: 1   gabapentin  (NEURONTIN ) 300 MG  capsule, One tab PO qHS for a week, then BID for a week, then TID. May double to 600mg  TID after an additional week if needed., Disp: 90 capsule, Rfl: 3   losartan -hydrochlorothiazide  (HYZAAR) 50-12.5 MG tablet, TAKE 1 TABLET EVERY DAY, Disp: 90 tablet, Rfl: 3   metoprolol  succinate (TOPROL -XL) 25 MG 24 hr tablet, Take 1 tablet (25 mg total) by mouth daily., Disp: 30 tablet, Rfl:  6   Omega-3 Fatty Acids (FISH OIL) 1000 MG CAPS, Take 1,000 mg by mouth daily., Disp: , Rfl:    oxyCODONE  (ROXICODONE ) 5 MG immediate release tablet, Take 1 tablet (5 mg total) by mouth every 6 (six) hours as needed for severe pain (pain score 7-10)., Disp: 15 tablet, Rfl: 0    Physical Exam: Blood pressure 108/68, pulse (!) 107, resp. rate 16, height 6' 2 (1.88 m), weight 213 lb 12.8 oz (97 kg), SpO2 96%.   Affect appropriate Healthy:  appears stated age HEENT: normal Neck supple with no adenopathy JVP normal no bruits no thyromegaly Lungs clear with no wheezing and good diaphragmatic motion Heart:  S1/S2 no murmur, no rub, gallop or click PMI normal Abdomen: benighn, BS positve, no tenderness, no AAA no bruit.  No HSM or HJR Distal pulses intact with no bruits No edema Neuro non-focal Skin warm and dry No muscular weakness   Labs:   Lab Results  Component Value Date   WBC 9.0 12/05/2023   HGB 13.4 12/05/2023   HCT 40.6 12/05/2023   MCV 91 12/05/2023   PLT 255 12/05/2023   No results for input(s): NA, K, CL, CO2, BUN, CREATININE, CALCIUM , PROT, BILITOT, ALKPHOS, ALT, AST, GLUCOSE in the last 168 hours.  Invalid input(s): LABALBU No results found for: CKTOTAL, CKMB, CKMBINDEX, TROPONINI  Lab Results  Component Value Date   CHOL 142 04/02/2021   CHOL 129 10/08/2019   CHOL 206 (H) 11/08/2018   Lab Results  Component Value Date   HDL 45 04/02/2021   HDL 47 10/08/2019   HDL 45 11/08/2018   Lab Results  Component Value Date   LDLCALC 83 04/02/2021    LDLCALC 68 10/08/2019   LDLCALC 139 (H) 11/08/2018   Lab Results  Component Value Date   TRIG 62 04/02/2021   TRIG 61 10/08/2019   TRIG 107 11/08/2018   Lab Results  Component Value Date   CHOLHDL 3.2 04/02/2021   CHOLHDL 2.7 10/08/2019   CHOLHDL 4.6 11/08/2018   No results found for: LDLDIRECT    Radiology: CT CORONARY MORPH W/CTA COR W/SCORE W/CA W/CM &/OR WO/CM Addendum Date: 12/24/2023 ADDENDUM REPORT: 12/24/2023 10:40 EXAM: OVER-READ INTERPRETATION  CT CHEST The following report is an over-read performed by radiologist Dr. Andrea Gasman of [facility] on 12/24/2023. This over-read does not include interpretation of cardiac or coronary anatomy or pathology. The coronary CTA interpretation by the cardiologist is attached. COMPARISON:  None. FINDINGS: Vascular: Minor aortic atherosclerosis. The ascending aorta is normal in caliber. Mediastinum/nodes: No adenopathy or mass. Unremarkable esophagus. Lungs: No focal airspace disease. No pulmonary nodule. No pleural fluid. Bronchial thickening. Upper abdomen: No acute or unexpected findings. Musculoskeletal: There are no acute or suspicious osseous abnormalities. IMPRESSION: 1. Bronchial thickening. 2.  Aortic Atherosclerosis (ICD10-I70.0). Electronically Signed   By: Andrea Gasman M.D.   On: 12/24/2023 10:40   Result Date: 12/24/2023 HISTORY: Chest pain (Ped 0-17y) EXAM: Cardiac/Coronary CT TECHNIQUE: The patient was scanned on a [employer]. PROTOCOL: A 120 kV prospective scan was triggered in the descending thoracic aorta at 111 HU's. Axial non-contrast 3 mm slices were carried out through the heart. The data set was analyzed on a dedicated work station and scored using the Agatston method. Gantry rotation speed was 250 msecs and collimation was 0.6 mm. Heart rate was optimized medically and sl NTG was given. The 3D data set was reconstructed in 5% intervals of the 35-75 % of the R-R cycle. Systolic  and diastolic  phases were analyzed on a dedicated work station using MPR, MIP and VRT modes. The patient received OMNIPAQUE  IOHEXOL  350 MG/ML SOLN of contrast. FINDINGS: Coronary calcium  score: The patient's coronary artery calcium  score is 2097, which places the patient in the 94th percentile. Coronary arteries: Normal coronary origins.  Right dominance. Left Main: Normal caliber vessel. The distal LM has a small focus of calcified plaque that is contiguous with plaque in the proximal LAD resulting in <25% stenosis. Left Anterior Descending: Normal caliber vessel. The proximal and mid-LAD have predominantly calcified plaque most prominent in the proximal LAD resulting in 50-69% stenosis. Diagonal branches: There are 3 small caliber diagonal branches, but the distal 2nd and 3rd diagonals are too small to characterize. D1 has a focus of calcified plaque in the mid-distal segment with 70-99% stenosis. Evaluation of the D1 lesion is limited by blooming artifact. Left Circumflex Artery: Normal caliber vessel. The proximal and mid LCx have scattered calcified plaques most notably in the proximal LCx resulting in 25-49% stenosis. Obtuse Marginal branches: Assessment of the OM branches is severely limited by motion artifact. Right Coronary Artery: Normal caliber vessel, gives rise to PDA. There is scattered calcified and non-calcified plaques throughout the RCA with a predominance of calcified plaque. The most notable plaque is calcified in the proximal RCA resulting in 70-99% stenosis. Assessment of the severity of stenosis is limited by blooming artifact. EXTRA-CORONARY FINDINGS: EXTRA-CORONARY FINDINGS Aorta: Normal size, 37 x 35 mm at the mid ascending aorta (level of the PA bifurcation) measured double oblique. The aortic root is mildly enlarged measuring 41 x 41 x 41 mm. There is minimal calcifications in the descending aorta. No dissection seen in visualized portions of the aorta. Normal size of the pulmonary artery Aortic  Valve: Trileaflet without calcifications. Normal pulmonary vein drainage into the left atrium. Normal left atrial appendage without a thrombus. Normal appearance of the pericardium. Non-cardiac findings will be assessed and reported in a separate document by radiology. IMPRESSION: 1. Multivessel, predominantly calcified, coronary artery disease is present in all 3 major epicardial coronary arteries. The most notable disease is present in the LAD (50-69% stenosis), first diagonal (70-99% stenosis) and RCA (70-99% stenosis). CADRADS = 4. CT FFR will be performed and reported on separately. 2. Coronary calcium  score of 2097. This was 94th percentile for age-, sex-, and race- matched controls. 3. Total plaque volume 1671 mm3. 4. Normal coronary origin with right dominance. 5. Mild dilation of the aortic root measuring 41 x 41 x 41 mm measured cusp to commissure INTERPRETATION: 1. CAD-RADS 0: No evidence of CAD (0%). Consider non-atherosclerotic causes of chest pain. 2. CAD-RADS 1: Minimal non-obstructive CAD (1-24%). Consider non-atherosclerotic causes of chest pain. Consider preventive therapy and risk factor modification. 3. CAD-RADS 2: Mild non-obstructive CAD (25-49%). Consider non-atherosclerotic causes of chest pain. Consider preventive therapy and risk factor modification. 4. CAD-RADS 3: Moderate stenosis (50-69%). Consider symptom-guided anti-ischemic pharmacotherapy as well as risk factor modification per guideline directed care. Additional analysis with CT FFR will be submitted. 5. CAD-RADS 4: Severe stenosis. (70-99% or > 50% left main). Cardiac catheterization or CT FFR is recommended. Consider symptom-guided anti-ischemic pharmacotherapy as well as risk factor modification per guideline directed care. Invasive coronary angiography recommended with revascularization per published guideline statements. 6. CAD-RADS 5: Total coronary occlusion (100%). Consider cardiac catheterization or viability assessment.  Consider symptom-guided anti-ischemic pharmacotherapy as well as risk factor modification per guideline directed care. 7. CAD-RADS N: Non-diagnostic study. Obstructive CAD can't be  excluded. Alternative evaluation is recommended. Electronically Signed: By: Georganna Archer On: 12/15/2023 13:15   CT CORONARY FRACTIONAL FLOW RESERVE FLUID ANALYSIS Result Date: 12/15/2023 EXAM: CT FFR ANALYSIS CLINICAL DATA:  chest pain FINDINGS: FFRct analysis was performed on the original cardiac CT angiogram dataset. Diagrammatic representation of the FFRct analysis is provided in a separate PDF document in PACS. This dictation was created using the PDF document and an interactive 3D model of the results. 3D model is not available in the EMR/PACS. Normal FFR range is >0.80. 1. LAD: Borderline flow limiting stenosis in the proximal and mid LAD. Potentially flow limiting stenosis in the distal LAD. Proximal FFR = 0.8 , Mid FFR = 0.75 , Distal FFR =0.68 2. Diagonal: Borderline flow limiting disease in D1. Proximal D1 FFR = 0.80. 3. LCX: No significant stenosis. Proximal FFR = 0.95 , Distal FFR = 0.87 4. RCA: No significant stenosis. Proximal FFR = 0.77 , Mid FFR = 0.77, distal FFR = 0.76 IMPRESSION: 1. CT FFR analysis demonstrates flow limiting stenosis in the distal LAD which may be overestimated given the small vessel caliber distally. 2. CT FFR analysis demonstrates borderline FFR values and thus potentially flow limiting lesions in the LAD and RCA. 3. Given the patient's elevated CAC score and symptoms, left heart catheterization is recommended for further evaluation. Electronically Signed   By: Georganna Archer   On: 12/15/2023 13:24    EKG: flutter rate 67 normal ST segments 12/05/23   ASSESSMENT AND PLAN:   Chest Pain: with very high calcium  score and abnormal FFR CT in distal LAD and throughout RCA. Shared decisions making favor cath to fully understand his dx. Continue Lipitor and Toprol  Will need to hold eliquis  2  days before He is about 7 weeks post DCC. Check labs today  PAF:  flutter Post [facility] 11/10/23 on Eliquis  and Toprol  F/U with [facility]/EP would arrange for flutter ablation after cath and coronary arteries taken care of  HTN:  continue Hyzaar Agitation:  prior ETOH abuse on Lexapro  f/u primary  Pre cath labs Orders written  F/U post cath  SL nitroglycerin called in   Signed: Maude Emmer 12/28/2023, 9:54 AM

## 2023-12-27 NOTE — Telephone Encounter (Signed)
 Discussed with patient placing PT on hold until cardiac work up and treatment are completed with patient in agreement with this plan.   Kaytlynne Neace, PT, DPT, ATC 12/27/23 10:43 AM

## 2023-12-28 ENCOUNTER — Ambulatory Visit: Admitting: Physical Therapy

## 2023-12-28 ENCOUNTER — Ambulatory Visit: Attending: Cardiovascular Disease | Admitting: Cardiovascular Disease

## 2023-12-28 ENCOUNTER — Encounter: Payer: Self-pay | Admitting: Cardiovascular Disease

## 2023-12-28 ENCOUNTER — Other Ambulatory Visit: Payer: Self-pay | Admitting: Cardiovascular Disease

## 2023-12-28 VITALS — BP 108/68 | HR 107 | Resp 16 | Ht 74.0 in | Wt 213.8 lb

## 2023-12-28 DIAGNOSIS — R931 Abnormal findings on diagnostic imaging of heart and coronary circulation: Secondary | ICD-10-CM

## 2023-12-28 DIAGNOSIS — R079 Chest pain, unspecified: Secondary | ICD-10-CM | POA: Diagnosis not present

## 2023-12-28 DIAGNOSIS — I483 Typical atrial flutter: Secondary | ICD-10-CM | POA: Diagnosis not present

## 2023-12-28 MED ORDER — NITROGLYCERIN 0.4 MG SL SUBL: 0.4000 mg | SUBLINGUAL_TABLET | SUBLINGUAL | 4 refills | Status: AC | PRN

## 2023-12-28 NOTE — Patient Instructions (Addendum)
 Medication Instructions:  NTG  O.4 MG SUBLINGUAL AS NEEDED FOR CHEST PAIN   *If you need a refill on your cardiac medications before your next appointment, please call your pharmacy*   Lab Work: CBC BMP  If you have labs (blood work) drawn today and your tests are completely normal, you will receive your results only by: MyChart Message (if you have MyChart) OR A paper copy in the mail If you have any lab test that is abnormal or we need to change your treatment, we will call you to review the results.   Testing/Procedures: Oct 30 , 2025 Your physician has requested that you have a cardiac catheterization. Cardiac catheterization is used to diagnose and/or treat various heart conditions. Doctors may recommend this procedure for a number of different reasons. The most common reason is to evaluate chest pain. Chest pain can be a symptom of coronary artery disease (CAD), and cardiac catheterization can show whether plaque is narrowing or blocking your heart's arteries. This procedure is also used to evaluate the valves, as well as measure the blood flow and oxygen levels in different parts of your heart. For further information please visit [email or web address] Please follow instruction sheet, as given.   Follow-Up: At [facility], you and your health needs are our priority.  As part of our continuing mission to provide you with exceptional heart care, we have created designated Provider Care Teams.  These Care Teams include your primary Cardiologist (physician) and Advanced Practice Providers (APPs -  Physician Assistants and Nurse Practitioners) who all work together to provide you with the care you need, when you need it.     Your next appointment:   3 TO 4 week(s)  The format for your next appointment:   In Person  Provider:   You will follow up in the Atrial Fibrillation Clinic located at [facility]. Your provider will be: Clint R. [facility], PA-C  You have been  referred to  ELECTROPHYSIOLOGIST   Other Instructions    [facility] A DEPT OF [place] HEARTCARE AT [address] A DEPT OF THE [place]. [facility] [address] [place] [address] Dept: [phone] Loc: [phone]  Chastin Riesgo III  12/28/2023  You are scheduled for a Cardiac Catheterization on Thursday, October 30 with Dr. Lonni End.  1. Please arrive at the [facility] (Main Entrance A) at [facility]: [address] [place], [place] [address] at 5:30 AM (This time is 2 hour(s) before your procedure to ensure your preparation).   Free valet parking service is available. You will check in at ADMITTING. The support person will be asked to wait in the waiting room.  It is OK to have someone drop you off and come back when you are ready to be discharged.    Special note: Every effort is made to have your procedure done on time. Please understand that emergencies sometimes delay scheduled procedures.  2. Diet: Nothing to eat after midnight.   3. Hydration: You need to be well hydrated before your procedure. On October 30, you may drink approved liquids (see below) until 2 hours before the procedure, with 16 oz of water  as your last intake.   List of approved liquids water , clear juice, clear tea, black coffee, fruit juices, non-citric and without pulp, carbonated beverages, Gatorade, Kool -Aid, plain Jello-O and plain ice popsicles.  4. Labs: You will need to have blood drawn on Thursday, October 23  at [facility] (1st Floor), [address], [place], [place] [address]. You do not need to be fasting.  5. Medication instructions in preparation for your procedure:   Contrast Allergy: No    Stop taking Eliquis  (Apixiban) on Tuesday, October 28. * LAST DOSE WILL BE ON MONDAY EVENING        On the morning of your procedure, take your Aspirin  81 mg and any morning  medicines NOT listed above.  You may use sips of water .  6. Plan to go home the same day, you will only stay overnight if medically necessary. 7. Bring a current list of your medications and current insurance cards. 8. You MUST have a responsible person to drive you home. 9. Someone MUST be with you the first 24 hours after you arrive home or your discharge will be delayed. 10. Please wear clothes that are easy to get on and off and wear slip-on shoes.  Thank you for allowing us  to care for you!   -- [place] [facility]

## 2023-12-29 LAB — BASIC METABOLIC PANEL WITH GFR
BUN/Creatinine Ratio: 20 (ref 10–24)
BUN: 24 mg/dL (ref 8–27)
CO2: 24 mmol/L (ref 20–29)
Calcium: 9.8 mg/dL (ref 8.6–10.2)
Chloride: 103 mmol/L (ref 96–106)
Creatinine, Ser: 1.22 mg/dL (ref 0.76–1.27)
Glucose: 89 mg/dL (ref 70–99)
Potassium: 4.5 mmol/L (ref 3.5–5.2)
Sodium: 139 mmol/L (ref 134–144)
eGFR: 64 mL/min/1.73 (ref >59)

## 2023-12-29 LAB — CBC
Hematocrit: 40.6 % (ref 37.5–51.0)
Hemoglobin: 13.2 g/dL (ref 13.0–17.7)
MCH: 29.9 pg (ref 26.6–33.0)
MCHC: 32.5 g/dL (ref 31.5–35.7)
MCV: 92 fL (ref 79–97)
Platelets: 235 x10E3/uL (ref 150–450)
RBC: 4.41 x10E6/uL (ref 4.14–5.80)
RDW: 14.3 % (ref 11.6–15.4)
WBC: 8.2 x10E3/uL (ref 3.4–10.8)

## 2024-01-01 ENCOUNTER — Ambulatory Visit: Admitting: Cardiovascular Disease

## 2024-01-02 ENCOUNTER — Telehealth: Payer: Self-pay | Admitting: *Deleted

## 2024-01-02 MED ORDER — OXYCODONE HCL 5 MG PO TABS: 5.0000 mg | ORAL_TABLET | Freq: Four times a day (QID) | ORAL | 0 refills | Status: DC | PRN

## 2024-01-02 NOTE — Telephone Encounter (Signed)
 Cardiac Catheterization scheduled at [facility] for: Thursday January 04, 2024 7:30 AM Arrival time [facility] Main Entrance A at: 5:30 AM  Diet: -Nothing to eat after midnight.  Hydration: -May drink clear liquids until 2 hours before the procedure.  Approved liquids: Water , clear tea, black coffee, fruit juices-non-citric and without pulp,Gatorade, plain Jello/popsicles.   -Please drink 16 oz of water  2 hours before procedure.  Medication instructions: -Hold:  Eliquis -none 01/02/24 until post procedure  Losartan -HCT-AM of procedure  -Other usual morning medications can be taken including aspirin  81 mg.  Plan to go home the same day, you will only stay overnight if medically necessary.  You must have responsible adult to drive you home.  Someone must be with you the first 24 hours after you arrive home.  Reviewed procedure instructions with patient.

## 2024-01-04 ENCOUNTER — Ambulatory Visit (HOSPITAL_COMMUNITY)
Admission: RE | Admit: 2024-01-04 | Discharge: 2024-01-04 | Disposition: A | Discharge status: Home / Self Care | Attending: Internal Medicine | Admitting: Internal Medicine

## 2024-01-04 ENCOUNTER — Other Ambulatory Visit: Payer: Self-pay

## 2024-01-04 ENCOUNTER — Encounter (HOSPITAL_COMMUNITY)
Admission: RE | Disposition: A | Discharge status: Home / Self Care | Payer: Self-pay | Source: Home / Self Care | Attending: Internal Medicine

## 2024-01-04 ENCOUNTER — Encounter (HOSPITAL_COMMUNITY): Payer: Self-pay | Admitting: Internal Medicine

## 2024-01-04 DIAGNOSIS — Z7901 Long term (current) use of anticoagulants: Secondary | ICD-10-CM | POA: Insufficient documentation

## 2024-01-04 DIAGNOSIS — R931 Abnormal findings on diagnostic imaging of heart and coronary circulation: Secondary | ICD-10-CM | POA: Insufficient documentation

## 2024-01-04 DIAGNOSIS — Z87891 Personal history of nicotine dependence: Secondary | ICD-10-CM | POA: Insufficient documentation

## 2024-01-04 DIAGNOSIS — I480 Paroxysmal atrial fibrillation: Secondary | ICD-10-CM | POA: Diagnosis not present

## 2024-01-04 DIAGNOSIS — Z79899 Other long term (current) drug therapy: Secondary | ICD-10-CM | POA: Diagnosis not present

## 2024-01-04 DIAGNOSIS — I10 Essential (primary) hypertension: Secondary | ICD-10-CM | POA: Insufficient documentation

## 2024-01-04 DIAGNOSIS — I2510 Atherosclerotic heart disease of native coronary artery without angina pectoris: Secondary | ICD-10-CM | POA: Insufficient documentation

## 2024-01-04 DIAGNOSIS — R451 Restlessness and agitation: Secondary | ICD-10-CM | POA: Insufficient documentation

## 2024-01-04 DIAGNOSIS — I2584 Coronary atherosclerosis due to calcified coronary lesion: Secondary | ICD-10-CM | POA: Insufficient documentation

## 2024-01-04 DIAGNOSIS — F1011 Alcohol abuse, in remission: Secondary | ICD-10-CM | POA: Diagnosis not present

## 2024-01-04 DIAGNOSIS — R079 Chest pain, unspecified: Secondary | ICD-10-CM | POA: Diagnosis present

## 2024-01-04 HISTORY — PX: LEFT HEART CATH AND CORONARY ANGIOGRAPHY: CATH118249

## 2024-01-04 SURGERY — LEFT HEART CATH AND CORONARY ANGIOGRAPHY
Anesthesia: LOCAL

## 2024-01-04 MED ORDER — MIDAZOLAM HCL (PF) 2 MG/2ML IJ SOLN
INTRAMUSCULAR | Status: DC | PRN
  Administered 2024-01-04: 1 mg via INTRAVENOUS

## 2024-01-04 MED ORDER — SODIUM CHLORIDE 0.9% FLUSH: 3.0000 mL | INTRAVENOUS | Status: DC | PRN

## 2024-01-04 MED ORDER — ONDANSETRON HCL 4 MG/2ML IJ SOLN: 4.0000 mg | Freq: Four times a day (QID) | INTRAMUSCULAR | Status: DC | PRN

## 2024-01-04 MED ORDER — HEPARIN SODIUM (PORCINE) 1000 UNIT/ML IJ SOLN
INTRAMUSCULAR | Status: AC
Start: 2024-01-04 — End: 2024-01-04
  Filled 2024-01-04: qty 10

## 2024-01-04 MED ORDER — SODIUM CHLORIDE 0.9 % IV SOLN: 250.0000 mL | INTRAVENOUS | Status: DC | PRN

## 2024-01-04 MED ORDER — FENTANYL CITRATE (PF) 100 MCG/2ML IJ SOLN
INTRAMUSCULAR | Status: AC
  Filled 2024-01-04: qty 2

## 2024-01-04 MED ORDER — MIDAZOLAM HCL 2 MG/2ML IJ SOLN
INTRAMUSCULAR | Status: AC
  Filled 2024-01-04: qty 2

## 2024-01-04 MED ORDER — VERAPAMIL HCL 2.5 MG/ML IV SOLN
INTRAVENOUS | Status: DC | PRN
  Administered 2024-01-04: 10 mL via INTRA_ARTERIAL

## 2024-01-04 MED ORDER — FENTANYL CITRATE (PF) 100 MCG/2ML IJ SOLN
INTRAMUSCULAR | Status: DC | PRN
  Administered 2024-01-04: 25 ug via INTRAVENOUS

## 2024-01-04 MED ORDER — HEPARIN (PORCINE) IN NACL 1000-0.9 UT/500ML-% IV SOLN
INTRAVENOUS | Status: DC | PRN
  Administered 2024-01-04 (×2): 500 mL

## 2024-01-04 MED ORDER — HEPARIN SODIUM (PORCINE) 1000 UNIT/ML IJ SOLN
INTRAMUSCULAR | Status: DC | PRN
  Administered 2024-01-04: 5000 [IU] via INTRAVENOUS

## 2024-01-04 MED ORDER — VERAPAMIL HCL 2.5 MG/ML IV SOLN
INTRAVENOUS | Status: AC
  Filled 2024-01-04: qty 2

## 2024-01-04 MED ORDER — LIDOCAINE HCL (PF) 1 % IJ SOLN
INTRAMUSCULAR | Status: AC
  Filled 2024-01-04: qty 30

## 2024-01-04 MED ORDER — HYDRALAZINE HCL 20 MG/ML IJ SOLN: 10.0000 mg | INTRAMUSCULAR | Status: DC | PRN

## 2024-01-04 MED ORDER — FREE WATER
500.0000 mL | Freq: Once | Status: AC
  Administered 2024-01-04: 500 mL via ORAL

## 2024-01-04 MED ORDER — LIDOCAINE HCL (PF) 1 % IJ SOLN
INTRAMUSCULAR | Status: DC | PRN
  Administered 2024-01-04: 2 mL

## 2024-01-04 MED ORDER — SODIUM CHLORIDE 0.9 % IV SOLN
INTRAVENOUS | Status: AC | PRN
  Administered 2024-01-04: 250 mL via INTRAVENOUS

## 2024-01-04 MED ORDER — ACETAMINOPHEN 325 MG PO TABS: 650.0000 mg | ORAL_TABLET | ORAL | Status: DC | PRN

## 2024-01-04 MED ORDER — ASPIRIN 81 MG PO CHEW: 81.0000 mg | CHEWABLE_TABLET | ORAL | Status: DC

## 2024-01-04 MED ORDER — SODIUM CHLORIDE 0.9% FLUSH: 3.0000 mL | Freq: Two times a day (BID) | INTRAVENOUS | Status: DC

## 2024-01-04 MED ORDER — IOHEXOL 350 MG/ML SOLN
INTRAVENOUS | Status: DC | PRN
  Administered 2024-01-04: 55 mL

## 2024-01-04 MED ORDER — FREE WATER: 500.0000 mL | Freq: Once | Status: DC

## 2024-01-04 SURGICAL SUPPLY — 6 items
CATH 5FR JL3.5 JR4 ANG PIG MP (CATHETERS) IMPLANT
DEVICE RAD COMP TR BAND LRG (VASCULAR PRODUCTS) IMPLANT
GLIDESHEATH SLEND SS 6F .021 (SHEATH) IMPLANT
GUIDEWIRE INQWIRE 1.5J.035X260 (WIRE) IMPLANT
PACK CARDIAC CATHETERIZATION (CUSTOM PROCEDURE TRAY) ×1 IMPLANT
SET ATX-X65L (MISCELLANEOUS) IMPLANT

## 2024-01-04 NOTE — Interval H&P Note (Signed)
 History and Physical Interval Note:  01/04/2024 7:14 AM  William Wheeler  has presented today for surgery, with the diagnosis of chest pain and abnormal cardiac CTA.  The various methods of treatment have been discussed with the patient and family. After consideration of risks, benefits and other options for treatment, the patient has consented to  Procedure(s): LEFT HEART CATH AND CORONARY ANGIOGRAPHY (N/A) as a surgical intervention.  The patient's history has been reviewed, patient examined, no change in status, stable for surgery.  I have reviewed the patient's chart and labs.  Questions were answered to the patient's satisfaction.    Cath Lab Visit (complete for each Cath Lab visit)  Clinical Evaluation Leading to the Procedure:   ACS: No.  Non-ACS:    Anginal Classification: CCS IV  Anti-ischemic medical therapy: Minimal Therapy (1 class of medications)  Non-Invasive Test Results: Multivessel CAD by cardiac CTA -> intermediate to high risk  Prior CABG: No previous CABG  Dre Gamino

## 2024-01-04 NOTE — Progress Notes (Signed)
 Patient and patient wife given discharge instructions, education provided no further questions at this time. Patient able to ambulate and void before discharge. Able to tolerate PO intake. Patient site is clean, dry, intact with no hematoma noted upon discharge. Patient wife Rosaline not at bedside, therefore pt wife was called and educated in regards to discharge instructions, no further questions at this time, given number to call when she is here and if she has any other questions.

## 2024-01-05 ENCOUNTER — Ambulatory Visit: Payer: Self-pay | Admitting: Cardiovascular Disease

## 2024-01-10 ENCOUNTER — Other Ambulatory Visit: Payer: Self-pay | Admitting: Family Medicine

## 2024-01-10 DIAGNOSIS — I10 Essential (primary) hypertension: Secondary | ICD-10-CM

## 2024-01-16 ENCOUNTER — Encounter: Payer: Self-pay | Admitting: Emergency Medicine

## 2024-01-16 ENCOUNTER — Ambulatory Visit

## 2024-01-16 ENCOUNTER — Ambulatory Visit
Admission: EM | Admit: 2024-01-16 | Discharge: 2024-01-16 | Disposition: A | Discharge status: Home / Self Care | Attending: Family Medicine | Admitting: Family Medicine

## 2024-01-16 DIAGNOSIS — M62830 Muscle spasm of back: Secondary | ICD-10-CM | POA: Diagnosis not present

## 2024-01-16 DIAGNOSIS — Y92009 Unspecified place in unspecified non-institutional (private) residence as the place of occurrence of the external cause: Secondary | ICD-10-CM | POA: Diagnosis not present

## 2024-01-16 DIAGNOSIS — M47812 Spondylosis without myelopathy or radiculopathy, cervical region: Secondary | ICD-10-CM | POA: Diagnosis not present

## 2024-01-16 DIAGNOSIS — M40202 Unspecified kyphosis, cervical region: Secondary | ICD-10-CM | POA: Diagnosis not present

## 2024-01-16 DIAGNOSIS — M542 Cervicalgia: Secondary | ICD-10-CM

## 2024-01-16 DIAGNOSIS — W19XXXA Unspecified fall, initial encounter: Secondary | ICD-10-CM

## 2024-01-16 MED ORDER — OXYCODONE-ACETAMINOPHEN 7.5-325 MG PO TABS: 1.0000 | ORAL_TABLET | Freq: Three times a day (TID) | ORAL | 0 refills | Status: DC | PRN

## 2024-01-16 MED ORDER — METHOCARBAMOL 500 MG PO TABS: 500.0000 mg | ORAL_TABLET | Freq: Three times a day (TID) | ORAL | 0 refills | Status: DC | PRN

## 2024-01-16 NOTE — ED Triage Notes (Signed)
 Patient states that he gets cramps in his legs Friday night.  He did get up to try and walk it out, then developed cramps in his feet and he tried to prevent himself from falling and ended falling backwards and hit his head.  Patient did not LOC.  Having back pain.  Patient has taken Oxycodone  5mg  (a previous prescription).  Has taken Oxycodone  w/Gabapentin  to help with the back pain.

## 2024-01-16 NOTE — ED Provider Notes (Signed)
 [name]    CSN: [id number] Arrival date & time: 01/16/24  1328      History   Chief Complaint Chief Complaint  Patient presents with   Fall    HPI William Wheeler is a 73 y.o. male.   HPI Pleasant 73 year old male presents with back pain, secondary to fall at home that occurred on Friday, 01/12/2024.SABRA  PMH significant for CVA, TIA, atrial flutter, and HTN.  Patient is not currently on apixaban  and denies any unusual bleeding.  PDMP results revealed patient was prescribed oxycodone  on 01/02/2024 by PCP, then gabapentin  on 01/12/2024.  Past Medical History:  Diagnosis Date   Alcohol abuse    Alcohol addiction (HCC)    Arthritis    Colon polyps    Depression    Diverticulitis    Diverticulitis    History of colon polyps    History of hiatal hernia    Hypercholesteremia    Hypertension    Infectious colitis    Skin cancer    Stroke [facility])     Patient Active Problem List   Diagnosis Date Noted   Atrial flutter (HCC) 11/10/2023   S/P laparoscopic-assisted sigmoidectomy 04/27/2023   Acute diverticulitis 11/20/2022   Lesion of urinary bladder 11/20/2022   Hypokalemia 11/20/2022   Irritability 07/07/2022   Frequent urination 07/07/2022   Cognitive impairment 07/07/2022   Cracked skin on feet 02/06/2022   Facial skin lesion 02/06/2022   Oropharyngeal dysphagia 02/06/2022   Muscle cramping 02/06/2022   Left ear pain 02/06/2022   Arthralgia 04/01/2021   Chronic low back pain 03/11/2021   TIA (transient ischemic attack) 06/02/2020   Inguinal hernia 12/05/2019   History of CVA (cerebrovascular accident) without residual deficits 08/13/2019   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) 07/02/2019   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   Cervical stenosis of spinal canal 11/16/2018   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   Herpes zoster without complication 11/16/2018   Essential hypertension 11/01/2018   Alcoholism in  remission (HCC) 11/01/2018   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 11/01/2018   History of colon polyps 11/01/2018   History of colonic diverticulitis 11/01/2018   Paresthesia of left arm 11/01/2018   Nocturia 11/01/2018   Sensory loss 11/01/2018    Past Surgical History:  Procedure Laterality Date   broken bone repair     Nose   CARDIOVERSION N/A 11/10/2023   Procedure: CARDIOVERSION;  Surgeon: Shlomo Wilbert SAUNDERS, MD;  Location: [facility];  Service: Cardiovascular;  Laterality: N/A;   COLONOSCOPY     [facility] per patient about 15 years ago   FLEXIBLE SIGMOIDOSCOPY N/A 04/27/2023   Procedure: FLEXIBLE SIGMOIDOSCOPY;  Surgeon: Teresa Lonni HERO, MD;  Location: [facility];  Service: General;  Laterality: N/A;   HERNIA REPAIR     LEFT HEART CATH AND CORONARY ANGIOGRAPHY N/A 01/04/2024   Procedure: LEFT HEART CATH AND CORONARY ANGIOGRAPHY;  Surgeon: Mady Lonni, MD;  Location: [facility] CV [facility];  Service: Cardiovascular;  Laterality: N/A;   skin cancer removed  10/2021   forehead   TONSILLECTOMY     XI ROBOTIC ASSISTED LOWER ANTERIOR RESECTION N/A 04/27/2023   Procedure: XI ROBOTIC ASSISTED LOWER ANTERIOR RESECTION WITH COLORECTAL ANASTOMOSIS;  Surgeon: Teresa Lonni HERO, MD;  Location: [facility];  Service: General;  Laterality: N/A;       Home Medications    Prior to Admission medications   Medication Sig Start Date End Date Taking? Authorizing Provider  ammonium  lactate (AMLACTIN) 12 % cream APPLY 1 APPLICATION TOPICALLY AS NEEDED FOR DRY SKIN. 12/16/22  Yes McCaughan, Dia D, DPM  apixaban  (ELIQUIS ) 5 MG TABS tablet Take 1 tablet (5 mg total) by mouth 2 (two) times daily. 11/02/23  Yes Fenton, Clint R, PA  atorvastatin  (LIPITOR) 40 MG tablet TAKE 1 TABLET EVERY DAY 06/26/23  Yes Alvia Bring, DO  Cyanocobalamin  (B-12 PO) Take 1 tablet by mouth daily.   Yes [provider]  escitalopram  (LEXAPRO ) 20 MG tablet Take 1 tablet (20 mg total) by mouth daily. 12/05/23  Yes Alvia Bring, DO  gabapentin  (NEURONTIN ) 300 MG capsule One tab PO qHS for a week, then BID for a week, then TID. May double to 600mg  TID after an additional week if needed. Patient taking differently: Take 300 mg by mouth daily at 12 noon. One tab PO qHS for a week, then BID for a week, then TID. May double to 600mg  TID after an additional week if needed. 12/15/23  Yes Alvia Bring, DO  losartan -hydrochlorothiazide  (HYZAAR) 50-12.5 MG tablet TAKE 1 TABLET EVERY DAY 01/10/24  Yes Alvia Bring, DO  methocarbamol (ROBAXIN) 500 MG tablet Take 1 tablet (500 mg total) by mouth 3 (three) times daily as needed. 01/16/24  Yes Teddy Sharper, FNP  metoprolol  succinate (TOPROL -XL) 25 MG 24 hr tablet Take 1 tablet (25 mg total) by mouth daily. 11/24/23  Yes Fenton, Clint R, PA  nitroGLYCERIN (NITROSTAT) 0.4 MG SL tablet Place 1 tablet (0.4 mg total) under the tongue every 5 (five) minutes as needed for chest pain. 12/28/23 03/27/24 Yes Delford Maude BROCKS, MD  Omega-3 Fatty Acids (FISH OIL) 1000 MG CAPS Take 1,000 mg by mouth daily.   Yes [provider]  oxyCODONE  (ROXICODONE ) 5 MG immediate release tablet Take 1 tablet (5 mg total) by mouth every 6 (six) hours as needed for severe pain (pain score 7-10). Patient taking differently: Take 5 mg by mouth daily at 12 noon. 01/02/24  Yes Alvia Bring, DO  oxyCODONE -acetaminophen  (PERCOCET) 7.5-325 MG tablet Take 1 tablet by mouth every 8 (eight) hours as needed for severe pain (pain score 7-10). 01/16/24  Yes Teddy Sharper, FNP    Family History Family History  Problem Relation Age of Onset   Leukemia Mother    Non-Hodgkin's lymphoma Mother    Other Father        unsure of medical history   Breast cancer Sister    Colon cancer Neg Hx    Esophageal cancer Neg Hx     Social History Social History   Tobacco Use   Smoking status: Former    Current packs/day: 0.00    Average packs/day: 2.5 packs/day for 29.0 years (72.5 ttl pk-yrs)    Types: Cigarettes     Start date: 16    Quit date: 2001    Years since quitting: 24.8   Smokeless tobacco: Never  Vaping Use   Vaping status: Never Used  Substance Use Topics   Alcohol use: Not Currently   Drug use: Yes    Types: Marijuana    Comment: occasionally     Allergies   Patient has no known allergies.   Review of Systems Review of Systems  Musculoskeletal:  Positive for back pain.  All other systems reviewed and are negative.    Physical Exam Triage Vital Signs ED Triage Vitals  Encounter Vitals Group     BP      Girls Systolic BP Percentile      Girls  Diastolic BP Percentile      Boys Systolic BP Percentile      Boys Diastolic BP Percentile      Pulse      Resp      Temp      Temp src      SpO2      Weight      Height      Head Circumference      Peak Flow      Pain Score      Pain Loc      Pain Education      Exclude from Growth Chart    No data found.  Updated Vital Signs BP 120/82 (BP Location: Right Arm)   Pulse 77   Temp 98.1 F (36.7 C) (Oral)   Resp 18   Ht 6' (1.829 m)   Wt 210 lb (95.3 kg)   SpO2 95%   BMI 28.48 kg/m    Physical Exam Vitals and nursing note reviewed.  Constitutional:      Appearance: Normal appearance. He is normal weight.  HENT:     Head: Normocephalic and atraumatic.     Right Ear: Tympanic membrane, ear canal and external ear normal.     Left Ear: Tympanic membrane, ear canal and external ear normal.     Mouth/Throat:     Mouth: Mucous membranes are moist.     Pharynx: Oropharynx is clear.  Eyes:     Extraocular Movements: Extraocular movements intact.     Conjunctiva/sclera: Conjunctivae normal.     Pupils: Pupils are equal, round, and reactive to light.  Neck:     Comments: Extreme LROM with 4 planes of movement, palpable bilateral trapezius spasms noted Cardiovascular:     Rate and Rhythm: Normal rate and regular rhythm.     Pulses: Normal pulses.     Heart sounds: Normal heart sounds.  Pulmonary:      Effort: Pulmonary effort is normal.     Breath sounds: Normal breath sounds. No wheezing, rhonchi or rales.  Musculoskeletal:        General: Normal range of motion.     Cervical back: Tenderness present.  Skin:    General: Skin is warm and dry.  Neurological:     General: No focal deficit present.     Mental Status: He is alert and oriented to person, place, and time. Mental status is at baseline.  Psychiatric:        Mood and Affect: Mood normal.        Behavior: Behavior normal.      UC Treatments / Results  Labs (all labs ordered are listed, but only abnormal results are displayed) Labs Reviewed - No data to display  EKG   Radiology DG Cervical Spine Complete Result Date: 01/16/2024 CLINICAL DATA:  Neck pain after fall on Friday EXAM: CERVICAL SPINE - COMPLETE 4+ VIEW COMPARISON:  11/01/2018 FINDINGS: Frontal, bilateral oblique, and lateral views of the cervical spine are obtained. There is reversal of cervical lordosis with mild kyphosis again noted, likely due to multilevel cervical degenerative changes. Otherwise alignment is anatomic. No evidence of acute displaced fracture. There is multilevel cervical spondylosis, most pronounced at C5-6 and C6-7. Mild diffuse facet hypertrophy throughout the entirety of the cervical spine, greatest at the C6-7 level. There is mild symmetrical neural foraminal encroachment at C3-4, C4-5, and C5-6. No significant change since prior exam. The prevertebral soft tissues are unremarkable. Lung apices are clear. IMPRESSION: 1. Stable multilevel  cervical spondylosis and facet hypertrophy, with mild symmetrical neural foraminal encroachment from C3-4 through C5-6. 2. No acute cervical spine fracture. Electronically Signed   By: Ozell Daring M.D.   On: 01/16/2024 15:01    Procedures Procedures (including critical care time)  Medications Ordered in UC Medications - No data to display  Initial Impression / Assessment and Plan / UC Course  I  have reviewed the triage vital signs and the nursing notes.  Pertinent labs & imaging results that were available during my care of the patient were reviewed by me and considered in my medical decision making (see chart for details).     MDM: 1.  Neck pain-cervical spine x-ray results revealed above, patient advised, Rx'd Percocet 7.5-325 mg tablet: Take 1 tablet every 8 hours as needed for severe pain; 2.  Fall in home, initial encounter-patient reports accidentally falling due to severe lower leg cramps on Friday, 01/12/24 at his home-patient reports hitting his head although neck is most concerning to him at this point; 3.  Spasm of both trapezius muscle-Rx'd Robaxin 500 mg tablet: Take 1 tablet 3 times daily, as needed as needed. Advised patient take medication as directed with food.  Advised may use Robaxin daily or as needed for neck spasms advised may use Percocet for severe/acute neck pain daily, as needed.  Patient advised of sedative effects of this medication.  Encouraged to increase daily water  intake to 64 ounces per day while taking this medication.  Advised if symptoms worsen and/or unresolved please follow-up with your PCP, [facility] orthopedics, or here for further evaluation.  Patient discharged home, hemodynamically stable. Final Clinical Impressions(s) / UC Diagnoses   Final diagnoses:  Fall in home, initial encounter  Neck pain  Spasm of both trapezius muscles     Discharge Instructions      Advised patient take medication as directed with food.  Advised may use Robaxin daily or as needed for neck spasms advised may use Percocet for severe/acute neck pain daily, as needed.  Patient advised of sedative effects of this medication.  Encouraged to increase daily water  intake to 64 ounces per day while taking this medication.  Advised if symptoms worsen and/or unresolved please follow-up with your PCP, [facility], or here for further evaluation.     ED  Prescriptions     Medication Sig Dispense Auth. Provider   oxyCODONE -acetaminophen  (PERCOCET) 7.5-325 MG tablet Take 1 tablet by mouth every 8 (eight) hours as needed for severe pain (pain score 7-10). 15 tablet Brode Sculley, FNP   methocarbamol (ROBAXIN) 500 MG tablet Take 1 tablet (500 mg total) by mouth 3 (three) times daily as needed. 30 tablet Khadim Lundberg, FNP      I have reviewed the PDMP during this encounter.   Teddy Ozell, FNP 01/16/24 1511

## 2024-01-16 NOTE — Discharge Instructions (Addendum)
 Advised patient take medication as directed with food.  Advised may use Robaxin daily or as needed for neck spasms advised may use Percocet for severe/acute neck pain daily, as needed.  Patient advised of sedative effects of this medication.  Encouraged to increase daily water  intake to 64 ounces per day while taking this medication.  Advised if symptoms worsen and/or unresolved please follow-up with your PCP, [facility], or here for further evaluation.

## 2024-01-17 NOTE — Progress Notes (Signed)
 Electrophysiology Office Note:    Date:  01/18/2024   ID:  William Wheeler, DOB 1950/08/07, MRN [id number]  PCP:  Alvia Bring, DO   [facility] Providers Cardiologist:  Maude Emmer, MD     Referring MD: Nellene Quita SAUNDERS, PA   History of Present Illness:    William Wheeler is a 73 y.o. male with a medical history significant for atrial flutter, past alcohol abuse, stroke, referred for management.       Discussed the use of AI scribe software for clinical note transcription with the patient, who gave verbal consent to proceed.  History of Present Illness  He was initially diagnosed with atrial flutter on presenting to the ER in August 2025 with symptoms of palpitations and shortness of breath.  He had flutter with variable block was started on Eliquis  and metoprolol  for rate control and eventually went DC cardioversion on November 10, 2023.  He presented to the ER on November 24, 2023 with recurrence of flutter and symptoms of palpitations.  Despite rate control, he had shortness of breath with exertion and also complained of occasional chest pain.  Due to the chest pain and a history of abnormal cardiac CT, he underwent cardiac catheterization on October 10 and was found to have severe but noncritical multivessel coronary disease including a 60% proximal LAD stenosis  He was seen in the ER for pain resulting from a fall from several days ago.         Today, he reports he is dong well.  EKGs/Labs/Other Studies Reviewed Today:     Echocardiogram:  TTE May 2021 LVEF 55 to 60%.  No wall motion abnormalities.  Normal RV function.  Normal valve structure and function.   Monitors:  Event monitor 07/2020 Results reviewed in epic   Cardiac catherization  December 15, 2023 Significant but noncritical multivessel coronary disease. LVEF 45%  EKG:   EKG Interpretation Date/Time:  Thursday January 18 2024 08:55:39 EST Ventricular Rate:   60 PR Interval:    QRS Duration:  102 QT Interval:  408 QTC Calculation: 408 R Axis:   80  Text Interpretation: Atrial flutter with variable A-V block When compared with ECG of 28-Dec-2023 09:37, No significant change was found Confirmed by Nancey Scotts [phone]) on 01/18/2024 9:18:53 AM     Physical Exam:    VS:  BP 110/82 (BP Location: Right Arm, Patient Position: Sitting, Cuff Size: Large)   Pulse (!) 58   Ht 6' (1.829 m)   Wt 215 lb (97.5 kg)   SpO2 98%   BMI 29.16 kg/m     Wt Readings from Last 3 Encounters:  01/18/24 215 lb (97.5 kg)  01/16/24 210 lb (95.3 kg)  01/04/24 208 lb (94.3 kg)     GEN: Well nourished, well developed in no acute distress CARDIAC: iRRR, no murmurs, rubs, gallops RESPIRATORY:  Normal work of breathing MUSCULOSKELETAL: no edema    ASSESSMENT & PLAN:     Atrial flutter Diagnosed August 2025 Symptomatic despite rate control Status post cardioversion September 2025 with early recurrence  We discussed the indication and logistics of the atrial flutter ablation.  I explained risks of vascular injury, need for blood transfusion, surgery, prolonged hospital stay, pacemaker, and a low but nonzero risk of death.  Using a shared decision making approach, we opted to schedule the procedure.  Secondary hypercoagulable state CHA2DS2-VASc score is 5 Continue Eliquis  5 mg p.o. twice daily  Multivessel coronary disease Manage medically Last  cath October 2025 Continue metoprolol  25, atorvastatin  40, Eliquis  5 mg      Signed, Eulas FORBES Furbish, MD  01/18/2024 9:19 AM    [facility]

## 2024-01-18 ENCOUNTER — Encounter: Payer: Self-pay | Admitting: Cardiovascular Disease

## 2024-01-18 ENCOUNTER — Ambulatory Visit: Attending: Cardiovascular Disease | Admitting: Cardiovascular Disease

## 2024-01-18 VITALS — BP 110/82 | HR 58 | Ht 72.0 in | Wt 215.0 lb

## 2024-01-18 DIAGNOSIS — Z01812 Encounter for preprocedural laboratory examination: Secondary | ICD-10-CM

## 2024-01-18 DIAGNOSIS — I4891 Unspecified atrial fibrillation: Secondary | ICD-10-CM | POA: Diagnosis not present

## 2024-01-18 DIAGNOSIS — I483 Typical atrial flutter: Secondary | ICD-10-CM | POA: Diagnosis not present

## 2024-01-18 NOTE — Patient Instructions (Addendum)
 Medication Instructions:  Your physician recommends that you continue on your current medications as directed. Please refer to the Current Medication list given to you today.  *If you need a refill on your cardiac medications before your next appointment, please call your pharmacy*  Testing/Procedures: Ablation Your physician has recommended that you have an ablation. Catheter ablation is a medical procedure used to treat some cardiac arrhythmias (irregular heartbeats). During catheter ablation, a long, thin, flexible tube is put into a blood vessel in your groin (upper thigh), or neck. This tube is called an ablation catheter. It is then guided to your heart through the blood vessel. Radio frequency waves destroy small areas of heart tissue where abnormal heartbeats may cause an arrhythmia to start.   You are scheduled for Atrial Flutter Ablation on Friday, January 9 with Dr. Dr. Nancey. Please arrive at the Main Entrance A at [facility]: [address] [place], [place] [address] at 530am  What To Expect: Please have labs drawn the week of 03/04/24 Labs: you will need to have lab work drawn within 30 days of your procedure. Please go to any [facility] location to have these drawn - no appointment is needed.  You will receive procedure instructions either through MyChart or in the mail 4-6 week prior to your procedure.  After your procedure we recommend no driving for 4 days, no lifting over 5 lbs for 7 days, and no work or strenuous activity for 7 days.  Please contact our office at [phone] if you have any questions.    Follow-Up: We will contact you to schedule your post-procedure appointments.

## 2024-01-31 ENCOUNTER — Ambulatory Visit: Admitting: Family Medicine

## 2024-02-02 ENCOUNTER — Other Ambulatory Visit (HOSPITAL_COMMUNITY): Payer: Self-pay | Admitting: Physician Assistant

## 2024-02-06 ENCOUNTER — Ambulatory Visit: Admitting: Family Medicine

## 2024-02-06 ENCOUNTER — Encounter: Payer: Self-pay | Admitting: Family Medicine

## 2024-02-06 VITALS — BP 95/67 | HR 64 | Ht 72.0 in | Wt 219.0 lb

## 2024-02-06 DIAGNOSIS — I10 Essential (primary) hypertension: Secondary | ICD-10-CM | POA: Diagnosis not present

## 2024-02-06 DIAGNOSIS — I483 Typical atrial flutter: Secondary | ICD-10-CM | POA: Diagnosis not present

## 2024-02-06 DIAGNOSIS — M545 Low back pain, unspecified: Secondary | ICD-10-CM | POA: Diagnosis not present

## 2024-02-06 DIAGNOSIS — Z8673 Personal history of transient ischemic attack (TIA), and cerebral infarction without residual deficits: Secondary | ICD-10-CM

## 2024-02-06 DIAGNOSIS — G8929 Other chronic pain: Secondary | ICD-10-CM | POA: Diagnosis not present

## 2024-02-06 NOTE — Assessment & Plan Note (Addendum)
 Remains on statin and aspirin .  Recommend continuation.  Some mild cognitive impairment.  Has been seen by neurology previously.

## 2024-02-06 NOTE — Assessment & Plan Note (Signed)
BP remains well controlled.  Continue losartan/hctz at current strength.   

## 2024-02-06 NOTE — Progress Notes (Signed)
 William Wheeler - 73 y.o. male MRN [id number]  Date of birth: 05/13/1950  Subjective Chief Complaint  Patient presents with   Fall   Procedure    Questions about ablation    Immunizations    HPI William Wheeler is a 73 y.o. male here today for follow up visit    Seen in September with complain of low back pain with radiation to L leg.  Using quite a bit of tylenol  at that time. Instructed to cut back on this.  Today he reports that this seems to be better for the most part.  He does continue to have pain but radiation is improved.  Pain worse when first standing up or sitting for long periods of time.  He had a fall a couple of weeks ago due to cramps in his lower extremities.  He did hit his head and neck.  Seen at [facility].  Rx for percocet and robaxin .  Reports that this is much better.   Recently had abnormal CT coronary.   Had heart cath showing noncritical multivessel CAD.  Remains in A. Flutter. He does have ablation procedure scheduled in January.   ROS:  A comprehensive ROS was completed and negative except as noted per HPI  No Known Allergies  Past Medical History:  Diagnosis Date   Alcohol abuse    Alcohol addiction (HCC)    Arthritis    Colon polyps    Depression    Diverticulitis    Diverticulitis    History of colon polyps    History of hiatal hernia    Hypercholesteremia    Hypertension    Infectious colitis    Skin cancer    Stroke [facility])     Past Surgical History:  Procedure Laterality Date   broken bone repair     Nose   CARDIOVERSION N/A 11/10/2023   Procedure: CARDIOVERSION;  Surgeon: Shlomo Wilbert SAUNDERS, MD;  Location: [facility];  Service: Cardiovascular;  Laterality: N/A;   COLONOSCOPY     [facility] per patient about 15 years ago   FLEXIBLE SIGMOIDOSCOPY N/A 04/27/2023   Procedure: FLEXIBLE SIGMOIDOSCOPY;  Surgeon: Teresa Lonni HERO, MD;  Location: [facility] OR[facility];  Service: General;  Laterality: N/A;   HERNIA REPAIR     LEFT HEART CATH  AND CORONARY ANGIOGRAPHY N/A 01/04/2024   Procedure: LEFT HEART CATH AND CORONARY ANGIOGRAPHY;  Surgeon: Mady Lonni, MD;  Location: [facility];  Service: Cardiovascular;  Laterality: N/A;   skin cancer removed  10/2021   forehead   TONSILLECTOMY     XI ROBOTIC ASSISTED LOWER ANTERIOR RESECTION N/A 04/27/2023   Procedure: XI ROBOTIC ASSISTED LOWER ANTERIOR RESECTION WITH COLORECTAL ANASTOMOSIS;  Surgeon: Teresa Lonni HERO, MD;  Location: [facility];  Service: General;  Laterality: N/A;    Social History   Socioeconomic History   Marital status: Married    Spouse name: Gearldine Gab   Number of children: 2   Years of education: some college   Highest education level: Associate degree: occupational, scientist, product/process development, or vocational program  Occupational History   Occupation: scientist, research (physical sciences)  Tobacco Use   Smoking status: Former    Current packs/day: 0.00    Average packs/day: 2.5 packs/day for 29.0 years (72.5 ttl pk-yrs)    Types: Cigarettes    Start date: 60    Quit date: 2001    Years since quitting: 24.9   Smokeless tobacco: Never  Vaping Use   Vaping status: Never  Used  Substance and Sexual Activity   Alcohol use: Not Currently   Drug use: Yes    Types: Marijuana    Comment: occasionally   Sexual activity: Not Currently    Partners: Female  Other Topics Concern   Not on file  Social History Narrative   Lives at home with his wife. 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    Social Drivers of Corporate Investment Banker Strain: Low Risk  (02/05/2024)   Overall Financial Resource Strain (CARDIA)    Difficulty of Paying Living Expenses: Not hard at all  Food Insecurity: No Food Insecurity (02/05/2024)   Hunger Vital Sign    Worried About Running Out of Food in the Last Year: Never true    Ran Out of Food in the Last Year: Never true  Transportation Needs: No Transportation Needs (02/05/2024)   PRAPARE - Administrator, Civil Service (Medical): No     Lack of Transportation (Non-Medical): No  Physical Activity: Insufficiently Active (02/05/2024)   Exercise Vital Sign    Days of Exercise per Week: 1 day    Minutes of Exercise per Session: 60 min  Stress: Stress Concern Present (02/05/2024)   [employer] of Occupational Health - Occupational Stress Questionnaire    Feeling of Stress: To some extent  Social Connections: Moderately Integrated (02/05/2024)   Social Connection and Isolation Panel    Frequency of Communication with Friends and Family: More than three times a week    Frequency of Social Gatherings with Friends and Family: Once a week    Attends Religious Services: More than 4 times per year    Active Member of [employer] or Organizations: No    Attends Engineer, Structural: Not on file    Marital Status: Married    Family History  Problem Relation Age of Onset   Leukemia Mother    Non-Hodgkin's lymphoma Mother    Other Father        unsure of medical history   Breast cancer Sister    Colon cancer Neg Hx    Esophageal cancer Neg Hx     Health Maintenance  Topic Date Due   Zoster Vaccines- Shingrix (2 of 2) 05/06/2024 (Originally 10/12/2019)   Influenza Vaccine  06/04/2024 (Originally 10/06/2023)   DTaP/Tdap/Td (1 - Tdap) 02/05/2025 (Originally 02/11/1972)   Pneumococcal Vaccine: 50+ Years (1 of 2 - PCV) 02/05/2025 (Originally 02/11/1972)   Hepatitis C Screening  02/05/2025 (Originally 02/11/1971)   COVID-19 Vaccine (6 - 2025-26 season) 02/21/2025 (Originally 11/06/2023)   Medicare Annual Wellness (AWV)  07/19/2024   Colonoscopy  03/02/2026   Meningococcal B Vaccine  Aged Out   Fecal DNA (Cologuard)  Discontinued     ----------------------------------------------------------------------------------------------------------------------------------------------------------------------------------------------------------------- Physical Exam BP 95/67   Pulse 64   Ht 6' (1.829 m)   Wt 219 lb (99.3 kg)   SpO2 98%    BMI 29.70 kg/m   Physical Exam Constitutional:      Appearance: Normal appearance.  HENT:     Head: Normocephalic and atraumatic.  Eyes:     General: No scleral icterus. Cardiovascular:     Rate and Rhythm: Normal rate. Rhythm irregular.  Pulmonary:     Effort: Pulmonary effort is normal.     Breath sounds: Normal breath sounds.  Musculoskeletal:     Cervical back: Neck supple.  Neurological:     General: No focal deficit present.     Mental Status: He is alert.     ------------------------------------------------------------------------------------------------------------------------------------------------------------------------------------------------------------------- Assessment  and Plan  Essential hypertension BP remains well controlled.  Continue losartan /hctz at current strength.   Chronic low back pain Overall his radicular symptoms have improved.  He does have some axial back pain.  Referral placed to pain management per his request.  Atrial flutter (HCC) Remains on bisoprolol.  Denies new or worsening symptoms.  Remains anticoagulated with Eliquis .  Has ablation scheduled for January.  History of CVA (cerebrovascular accident) without residual deficits Remains on statin and aspirin .  Recommend continuation.  Some mild cognitive impairment.  Has been seen by neurology previously.   No orders of the defined types were placed in this encounter.   No follow-ups on file.

## 2024-02-06 NOTE — Assessment & Plan Note (Signed)
 Remains on bisoprolol.  Denies new or worsening symptoms.  Remains anticoagulated with Eliquis .  Has ablation scheduled for January.

## 2024-02-06 NOTE — Assessment & Plan Note (Signed)
 Overall his radicular symptoms have improved.  He does have some axial back pain.  Referral placed to pain management per his request.

## 2024-02-07 ENCOUNTER — Telehealth: Payer: Self-pay

## 2024-02-07 NOTE — Telephone Encounter (Signed)
-----   Message from Nurse Aldona R sent at 01/23/2024  9:51 AM EST ----- Regarding: 01/09 aflutter ablation Mealor Important: list procedure date as first item in subject line, followed by procedure type (e.g., 11/17/23 AFib ablation)  Precert:  MD: Mealor Type of ablation: A-flutter Diagnosis: aflutter CPT code: A-flutter (06346) Ablation scheduled (date/time): 03/15/24 730am  Procedure:  Added to calendar? Yes Orders entered? No, >30 days before procedure Letter complete? No, >30 days before procedure Scheduled with cath lab? Yes Any medications to hold? Routine Labs ordered (CBC, BMET, PT/INR if on warfarin): Yes Mapping system: CARTO (lab 4 or 6) CARTO/OPAL rep notified? Yes Cardiac CT needed? No Dye allergy? No Pre-meds ordered and instructions given? N/A Letter method: MyChart H&P: 11/13 Device: No  Follow-up:  Cassie/Angel, please schedule Routine.  Covering RN - please send this message to [employer], EP scheduler, EP Scheduling pool, EP [employer], and CT scheduler (Brittany Lynch/Stephanie Mogg), if indicated.

## 2024-02-22 ENCOUNTER — Telehealth (HOSPITAL_COMMUNITY): Payer: Self-pay

## 2024-02-22 NOTE — Telephone Encounter (Signed)
 Attempted to reach patient to discuss upcoming procedure, no answer. Left VM for patient to return call.

## 2024-02-27 NOTE — Telephone Encounter (Signed)
 Received return call from patient to discuss upcoming procedure.   CT: not required Labs: to be completed.   Any recent signs of acute illness or been started on antibiotics? No Any new medications started?No Any medications to hold?  routine Any missed doses of blood thinner?  No Advised patient to continue taking Eliquis  (Apixaban ) twice daily without missing any doses.  Medication instructions:  On the morning of your procedure DO NOT take any medication., including Eliquis  (Apixaban ) or the procedure may be rescheduled. Nothing to eat or drink after midnight prior to your procedure.  Confirmed patient is scheduled for Atrial Flutter Ablation on Friday, January 9 with Dr. Nancey. Instructed patient to arrive at the Main Entrance A at [facility]: [address] [place], [place] [address] and check in at Admitting at 5:30 AM.   Plan to go home the same day, you will only stay overnight if medically necessary. You MUST have a responsible adult to drive you home and MUST be with you the first 24 hours after you arrive home or your procedure could be cancelled.  Informed patient a nurse will call a day before the procedure to confirm arrival time and ensure instructions are followed.  Patient verbalized understanding to all instructions provided and agreed to proceed with procedure.   Advised patient to contact RN Navigator at [phone], to inform of any new medications started after call or concerns prior to procedure.

## 2024-02-27 NOTE — Telephone Encounter (Signed)
 Received a vm from patient returning call. Attempted to reach patient back, no answer. DPR on file- Left detailed message regarding:  Arrival time- 0530 AM on 03/15/24 to [facility] admitting Do NOT miss any doses of Eliquis   Nothing to eat or drink after midnight No meds AM of procedure, including Eliquis  Responsible person to drive you home and stay with you for 24 hours Contact for any changes in health status or start of any new medications Contact information for Nurse Navigator provided.

## 2024-03-01 LAB — CBC
Hematocrit: 41.1 % (ref 37.5–51.0)
Hemoglobin: 13.2 g/dL (ref 13.0–17.7)
MCH: 29.9 pg (ref 26.6–33.0)
MCHC: 32.1 g/dL (ref 31.5–35.7)
MCV: 93 fL (ref 79–97)
Platelets: 213 x10E3/uL (ref 150–450)
RBC: 4.42 x10E6/uL (ref 4.14–5.80)
RDW: 12.9 % (ref 11.6–15.4)
WBC: 6.3 x10E3/uL (ref 3.4–10.8)

## 2024-03-01 LAB — BASIC METABOLIC PANEL WITH GFR
BUN/Creatinine Ratio: 17 (ref 10–24)
BUN: 22 mg/dL (ref 8–27)
CO2: 24 mmol/L (ref 20–29)
Calcium: 9.8 mg/dL (ref 8.6–10.2)
Chloride: 104 mmol/L (ref 96–106)
Creatinine, Ser: 1.27 mg/dL (ref 0.76–1.27)
Glucose: 90 mg/dL (ref 70–99)
Potassium: 4.9 mmol/L (ref 3.5–5.2)
Sodium: 140 mmol/L (ref 134–144)
eGFR: 60 mL/min/1.73

## 2024-03-04 ENCOUNTER — Ambulatory Visit: Payer: Self-pay | Admitting: Cardiovascular Disease

## 2024-03-14 NOTE — Pre-Procedure Instructions (Signed)
 Attempted to call patient regarding procedure instructions.  Left voicemail on the following items: Arrival time 0515 Nothing to eat or drink after midnight No meds AM of procedure Responsible person to drive you home and stay with you for 24 hrs  Have you missed any doses of anti-coagulant Eliquis - should be taken twice a day, if you have missed any doses.  Don't take dose morning of procedure.

## 2024-03-15 ENCOUNTER — Other Ambulatory Visit: Payer: Self-pay

## 2024-03-15 ENCOUNTER — Ambulatory Visit (HOSPITAL_COMMUNITY)
Admission: RE | Disposition: A | Discharge status: Home / Self Care | Payer: Self-pay | Source: Home / Self Care | Attending: Cardiovascular Disease

## 2024-03-15 ENCOUNTER — Ambulatory Visit (HOSPITAL_COMMUNITY): Admitting: Certified Registered Nurse Anesthetist

## 2024-03-15 ENCOUNTER — Ambulatory Visit (HOSPITAL_COMMUNITY)
Admission: RE | Admit: 2024-03-15 | Discharge: 2024-03-15 | Disposition: A | Discharge status: Home / Self Care | Attending: Cardiovascular Disease | Admitting: Cardiovascular Disease

## 2024-03-15 DIAGNOSIS — Z8673 Personal history of transient ischemic attack (TIA), and cerebral infarction without residual deficits: Secondary | ICD-10-CM | POA: Diagnosis not present

## 2024-03-15 DIAGNOSIS — I483 Typical atrial flutter: Secondary | ICD-10-CM

## 2024-03-15 DIAGNOSIS — I10 Essential (primary) hypertension: Secondary | ICD-10-CM | POA: Insufficient documentation

## 2024-03-15 DIAGNOSIS — I484 Atypical atrial flutter: Secondary | ICD-10-CM | POA: Diagnosis not present

## 2024-03-15 DIAGNOSIS — Z7901 Long term (current) use of anticoagulants: Secondary | ICD-10-CM | POA: Diagnosis not present

## 2024-03-15 DIAGNOSIS — Z79899 Other long term (current) drug therapy: Secondary | ICD-10-CM | POA: Insufficient documentation

## 2024-03-15 DIAGNOSIS — I4892 Unspecified atrial flutter: Secondary | ICD-10-CM | POA: Diagnosis present

## 2024-03-15 DIAGNOSIS — Z87891 Personal history of nicotine dependence: Secondary | ICD-10-CM | POA: Diagnosis not present

## 2024-03-15 DIAGNOSIS — F1021 Alcohol dependence, in remission: Secondary | ICD-10-CM | POA: Insufficient documentation

## 2024-03-15 DIAGNOSIS — I2510 Atherosclerotic heart disease of native coronary artery without angina pectoris: Secondary | ICD-10-CM

## 2024-03-15 DIAGNOSIS — D6869 Other thrombophilia: Secondary | ICD-10-CM | POA: Diagnosis not present

## 2024-03-15 HISTORY — PX: A-FLUTTER ABLATION: EP1230

## 2024-03-15 MED ORDER — SODIUM CHLORIDE 0.9% FLUSH: 3.0000 mL | INTRAVENOUS | Status: DC | PRN

## 2024-03-15 MED ORDER — PHENYLEPHRINE HCL-NACL 20-0.9 MG/250ML-% IV SOLN
INTRAVENOUS | Status: DC | PRN
  Administered 2024-03-15: 35 ug/min via INTRAVENOUS

## 2024-03-15 MED ORDER — ACETAMINOPHEN 325 MG PO TABS: 650.0000 mg | ORAL_TABLET | ORAL | Status: DC | PRN

## 2024-03-15 MED ORDER — FENTANYL CITRATE (PF) 100 MCG/2ML IJ SOLN
INTRAMUSCULAR | Status: AC
  Filled 2024-03-15: qty 2

## 2024-03-15 MED ORDER — ONDANSETRON HCL 4 MG/2ML IJ SOLN
INTRAMUSCULAR | Status: DC | PRN
  Administered 2024-03-15: 4 mg via INTRAVENOUS

## 2024-03-15 MED ORDER — FENTANYL CITRATE (PF) 250 MCG/5ML IJ SOLN
INTRAMUSCULAR | Status: DC | PRN
  Administered 2024-03-15: 50 ug via INTRAVENOUS

## 2024-03-15 MED ORDER — SODIUM CHLORIDE 0.9% FLUSH: 3.0000 mL | Freq: Two times a day (BID) | INTRAVENOUS | Status: DC

## 2024-03-15 MED ORDER — VASOPRESSIN 20 UNIT/ML IV SOLN
INTRAVENOUS | Status: AC
  Filled 2024-03-15: qty 1

## 2024-03-15 MED ORDER — SUGAMMADEX SODIUM 200 MG/2ML IV SOLN
INTRAVENOUS | Status: DC | PRN
  Administered 2024-03-15: 200 mg via INTRAVENOUS

## 2024-03-15 MED ORDER — SODIUM CHLORIDE 0.9 % IV SOLN: INTRAVENOUS | Status: DC

## 2024-03-15 MED ORDER — LIDOCAINE 2% (20 MG/ML) 5 ML SYRINGE
INTRAMUSCULAR | Status: DC | PRN
  Administered 2024-03-15: 100 mg via INTRAVENOUS

## 2024-03-15 MED ORDER — PROPOFOL 10 MG/ML IV BOLUS
INTRAVENOUS | Status: DC | PRN
  Administered 2024-03-15: 150 mg via INTRAVENOUS

## 2024-03-15 MED ORDER — HEPARIN (PORCINE) IN NACL 1000-0.9 UT/500ML-% IV SOLN
INTRAVENOUS | Status: DC | PRN
  Administered 2024-03-15 (×2): 500 mL

## 2024-03-15 MED ORDER — DEXAMETHASONE SOD PHOSPHATE PF 10 MG/ML IJ SOLN
INTRAMUSCULAR | Status: DC | PRN
  Administered 2024-03-15: 8 mg via INTRAVENOUS

## 2024-03-15 MED ORDER — ONDANSETRON HCL 4 MG/2ML IJ SOLN: 4.0000 mg | Freq: Four times a day (QID) | INTRAMUSCULAR | Status: DC | PRN

## 2024-03-15 MED ORDER — ROCURONIUM BROMIDE 10 MG/ML (PF) SYRINGE
PREFILLED_SYRINGE | INTRAVENOUS | Status: DC | PRN
  Administered 2024-03-15: 50 mg via INTRAVENOUS

## 2024-03-15 MED ORDER — ATROPINE SULFATE 1 MG/10ML IJ SOSY
PREFILLED_SYRINGE | INTRAMUSCULAR | Status: AC
  Filled 2024-03-15: qty 10

## 2024-03-15 MED ORDER — SODIUM CHLORIDE 0.9 % IV SOLN: 250.0000 mL | INTRAVENOUS | Status: DC | PRN

## 2024-03-15 NOTE — Anesthesia Postprocedure Evaluation (Signed)
"   Anesthesia Post Note  Patient: William Wheeler  Procedure(s) Performed: A-FLUTTER ABLATION     Patient location during evaluation: PACU Anesthesia Type: General Level of consciousness: awake and alert Pain management: pain level controlled Vital Signs Assessment: post-procedure vital signs reviewed and stable Respiratory status: spontaneous breathing, nonlabored ventilation, respiratory function stable and patient connected to nasal cannula oxygen Cardiovascular status: blood pressure returned to baseline and stable Postop Assessment: no apparent nausea or vomiting Anesthetic complications: no   No notable events documented.  Last Vitals:  Vitals:   03/15/24 1130 03/15/24 1200  BP: 111/65 (!) 126/94  Pulse: 69 75  Resp: 17 13  Temp:    SpO2: 98% 96%    Last Pain:  Vitals:   03/15/24 1005  TempSrc:   PainSc: 0-No pain   Pain Goal:                   [name]      "

## 2024-03-15 NOTE — Transfer of Care (Signed)
 Immediate Anesthesia Transfer of Care Note  Patient: William Wheeler  Procedure(s) Performed: A-FLUTTER ABLATION  Patient Location: PACU  Anesthesia Type:General  Level of Consciousness: awake and alert   Airway & Oxygen Therapy: Patient Spontanous Breathing and Patient connected to nasal cannula oxygen  Post-op Assessment: Report given to RN and Post -op Vital signs reviewed and stable  Post vital signs: Reviewed and stable  Last Vitals:  Vitals Value Taken Time  BP 120/79 03/15/24 09:00  Temp    Pulse 50 03/15/24 09:03  Resp 11 03/15/24 09:03  SpO2 98 % 03/15/24 09:03  Vitals shown include unfiled device data.  Last Pain:  Vitals:   03/15/24 0900  PainSc: 0-No pain         Complications: No notable events documented.

## 2024-03-15 NOTE — Anesthesia Preprocedure Evaluation (Signed)
"                                    Anesthesia Evaluation  Patient identified by MRN, date of birth, ID band Patient awake    Reviewed: Allergy & Precautions, NPO status , Patient's Chart, lab work & pertinent test results  History of Anesthesia Complications Negative for: history of anesthetic complications  Airway Mallampati: III  TM Distance: >3 FB Neck ROM: Limited    Dental no notable dental hx. (+) Teeth Intact   Pulmonary neg pulmonary ROS, neg sleep apnea, neg COPD, Patient abstained from smoking.Not current smoker, former smoker   Pulmonary exam normal breath sounds clear to auscultation       Cardiovascular Exercise Tolerance: Good METShypertension, Pt. on medications + CAD  (-) Past MI + dysrhythmias Atrial Fibrillation  Rhythm:Irregular Rate:Bradycardia - Systolic murmurs    Neuro/Psych  PSYCHIATRIC DISORDERS  Depression    Neck pain after neck injury with limited neck extension  Neuromuscular disease CVA, No Residual Symptoms    GI/Hepatic hiatal hernia,neg GERD  ,,(+)     (-) substance abuse    Endo/Other  neg diabetes    Renal/GU negative Renal ROS     Musculoskeletal   Abdominal   Peds  Hematology   Anesthesia Other Findings Past Medical History: No date: Alcohol abuse No date: Alcohol addiction (HCC) No date: Arthritis No date: Colon polyps No date: Depression No date: Diverticulitis No date: Diverticulitis No date: History of colon polyps No date: History of hiatal hernia No date: Hypercholesteremia No date: Hypertension No date: Infectious colitis No date: Skin cancer No date: Stroke [facility])  Reproductive/Obstetrics                              Anesthesia Physical Anesthesia Plan  ASA: 3  Anesthesia Plan: General   Post-op Pain Management: Minimal or no pain anticipated   Induction: Intravenous  PONV Risk Score and Plan: 2 and Ondansetron , Dexamethasone  and Midazolam   Airway  Management Planned: Oral ETT and Video Laryngoscope Planned  Additional Equipment: None  Intra-op Plan:   Post-operative Plan: Extubation in OR  Informed Consent: I have reviewed the patients History and Physical, chart, labs and discussed the procedure including the risks, benefits and alternatives for the proposed anesthesia with the patient or authorized representative who has indicated his/her understanding and acceptance.     Dental advisory given  Plan Discussed with: CRNA and Surgeon  Anesthesia Plan Comments: (Discussed risks of anesthesia with patient, including PONV, sore throat, lip/dental/eye damage. Rare risks discussed as well, such as cardiorespiratory and neurological sequelae, and allergic reactions. Discussed the role of CRNA in patient's perioperative care. Patient understands.)        Anesthesia Quick Evaluation  "

## 2024-03-15 NOTE — Anesthesia Procedure Notes (Signed)
 Procedure Name: Intubation Date/Time: 03/15/2024 7:48 AM  Performed by: Boyce Shilling, CRNAPre-anesthesia Checklist: Patient identified, Emergency Drugs available, Suction available, Timeout performed and Patient being monitored Patient Re-evaluated:Patient Re-evaluated prior to induction Oxygen Delivery Method: Circle system utilized Preoxygenation: Pre-oxygenation with 100% oxygen Induction Type: IV induction Ventilation: Mask ventilation without difficulty Laryngoscope Size: Glidescope and 4 Grade View: Grade I Tube type: Oral Tube size: 7.5 mm Number of attempts: 1 Airway Equipment and Method: Stylet Placement Confirmation: ETT inserted through vocal cords under direct vision, positive ETCO2, CO2 detector and breath sounds checked- equal and bilateral Secured at: 24 cm Tube secured with: Tape Dental Injury: Teeth and Oropharynx as per pre-operative assessment  Difficulty Due To: Difficulty was anticipated, Difficult Airway- due to reduced neck mobility and Difficult Airway- due to limited oral opening Comments: Elective glidescope intubation d/t neck pain and limited neck mobility. Maintained head and neck midline and neutral position. Smooth intubation

## 2024-03-15 NOTE — Discharge Instructions (Signed)

## 2024-03-15 NOTE — Progress Notes (Addendum)
 Patient and patient wife (via telephone) given discharge instructions, education provided no further questions at this time. Patient able to ambulate and void before discharge. Able to tolerate PO intake. Patient site noted to have mild amount of oozing, pressure held, oozing stopped, pt laid flat for 30 min, able to sit up and ambulate without any issues. Upon discharge site is clean, dry, intact with no hematoma noted. New appointment made for follow up. Seen by MD verified when to resume Eliquis , written on discharge paperwork.

## 2024-03-15 NOTE — H&P (Signed)
" °  Electrophysiology Office Note:    Date:  03/15/2024   ID:  William Wheeler, DOB 1950-08-08, MRN [id number]  PCP:  Alvia Bring, DO   [facility] Providers Cardiologist:  Maude Emmer, MD     Referring MD: No ref. provider found   History of Present Illness:    William Wheeler is a 74 y.o. male with a medical history significant for atrial flutter, past alcohol abuse, stroke, referred for management.       Discussed the use of AI scribe software for clinical note transcription with the patient, who gave verbal consent to proceed.  History of Present Illness  He was initially diagnosed with atrial flutter on presenting to the ER in August 2025 with symptoms of palpitations and shortness of breath.  He had flutter with variable block was started on Eliquis  and metoprolol  for rate control and eventually went DC cardioversion on November 10, 2023.  He presented to the ER on November 24, 2023 with recurrence of flutter and symptoms of palpitations.  Despite rate control, he had shortness of breath with exertion and also complained of occasional chest pain.  Due to the chest pain and a history of abnormal cardiac CT, he underwent cardiac catheterization on October 10 and was found to have severe but noncritical multivessel coronary disease including a 60% proximal LAD stenosis  He was seen in the ER for pain resulting from a fall from several days ago.         Today, he reports he is dong well. He presents today for a flutter ablation. No changes in diagnoses or medications. He had a fall about 5 weeks ago.  EKGs/Labs/Other Studies Reviewed Today:     Echocardiogram:  TTE May 2021 LVEF 55 to 60%.  No wall motion abnormalities.  Normal RV function.  Normal valve structure and function.   Monitors:  Event monitor 07/2020 Results reviewed in epic   Cardiac catherization  December 15, 2023 Significant but noncritical multivessel coronary  disease. LVEF 45%  EKG:         Physical Exam:    VS:  BP 109/78   Pulse (!) 59   Temp (!) 97.5 F (36.4 C)   Resp 16   Ht 6' (1.829 m)   Wt 94.3 kg   SpO2 98%   BMI 28.21 kg/m     Wt Readings from Last 3 Encounters:  03/15/24 94.3 kg  02/06/24 99.3 kg  01/18/24 97.5 kg     GEN: Well nourished, well developed in no acute distress CARDIAC: iRRR, no murmurs, rubs, gallops RESPIRATORY:  Normal work of breathing MUSCULOSKELETAL: no edema    ASSESSMENT & PLAN:     Atrial flutter Diagnosed August 2025 Symptomatic despite rate control Status post cardioversion September 2025 with early recurrence  We discussed the indication and logistics of the atrial flutter ablation.  I explained risks of vascular injury, need for blood transfusion, surgery, prolonged hospital stay, pacemaker, and a low but nonzero risk of death.  Using a shared decision making approach, we opted to schedule the procedure.  Secondary hypercoagulable state CHA2DS2-VASc score is 5 Continue Eliquis  5 mg p.o. twice daily  Multivessel coronary disease Manage medically Last cath October 2025 Continue metoprolol  25, atorvastatin  40, Eliquis  5 mg      Signed, Eulas FORBES Furbish, MD  03/15/2024 7:13 AM    [facility]  "

## 2024-03-17 ENCOUNTER — Encounter (HOSPITAL_COMMUNITY): Payer: Self-pay | Admitting: Cardiovascular Disease

## 2024-03-18 ENCOUNTER — Telehealth (HOSPITAL_COMMUNITY): Payer: Self-pay

## 2024-03-18 MED FILL — Vasopressin IV Soln 20 Unit/ML (For IV Infusion): INTRAVENOUS | Qty: 1 | Status: AC

## 2024-03-18 MED FILL — Atropine Sulfate Soln Prefill Syr 1 MG/10ML (0.1 MG/ML): INTRAMUSCULAR | Qty: 10 | Status: AC

## 2024-03-18 MED FILL — Fentanyl Citrate Preservative Free (PF) Inj 100 MCG/2ML: INTRAMUSCULAR | Qty: 1 | Status: AC

## 2024-03-18 NOTE — Telephone Encounter (Signed)
 Attempted to reach patient to follow up with procedure completed on 03/15/24, no answer. Left VM for patient to return call.

## 2024-03-21 ENCOUNTER — Ambulatory Visit: Admitting: Family Medicine

## 2024-04-17 ENCOUNTER — Ambulatory Visit: Admitting: Physician Assistant

## 2024-07-23 ENCOUNTER — Ambulatory Visit
